# Patient Record
Sex: Female | Born: 1990
Health system: Southern US, Community
[De-identification: ages and names within clinical notes are randomized; demographics above are authoritative.]

## PROBLEM LIST (undated history)

## (undated) ENCOUNTER — Inpatient Hospital Stay (HOSPITAL_COMMUNITY): Payer: Self-pay

## (undated) DIAGNOSIS — F32A Depression, unspecified: Secondary | ICD-10-CM

## (undated) DIAGNOSIS — A599 Trichomoniasis, unspecified: Secondary | ICD-10-CM

## (undated) DIAGNOSIS — E669 Obesity, unspecified: Secondary | ICD-10-CM

## (undated) DIAGNOSIS — A549 Gonococcal infection, unspecified: Secondary | ICD-10-CM

## (undated) DIAGNOSIS — F419 Anxiety disorder, unspecified: Secondary | ICD-10-CM

## (undated) DIAGNOSIS — F329 Major depressive disorder, single episode, unspecified: Secondary | ICD-10-CM

## (undated) DIAGNOSIS — A749 Chlamydial infection, unspecified: Secondary | ICD-10-CM

## (undated) HISTORY — DX: Trichomoniasis, unspecified: A59.9

## (undated) HISTORY — DX: Gonococcal infection, unspecified: A54.9

## (undated) HISTORY — PX: TONSILLECTOMY: SUR1361

## (undated) HISTORY — DX: Chlamydial infection, unspecified: A74.9

## (undated) HISTORY — PX: FOOT SURGERY: SHX648

## (undated) HISTORY — PX: ADENOIDECTOMY: SUR15

---
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
## 2010-07-17 ENCOUNTER — Emergency Department (HOSPITAL_COMMUNITY): Admission: EM | Admit: 2010-07-17 | Discharge: 2010-07-17 | Payer: Self-pay | Admitting: Emergency Medicine

## 2010-08-17 ENCOUNTER — Emergency Department (HOSPITAL_COMMUNITY): Admission: EM | Admit: 2010-08-17 | Discharge: 2010-08-17 | Payer: Self-pay | Admitting: Emergency Medicine

## 2011-07-20 LAB — URINALYSIS, ROUTINE W REFLEX MICROSCOPIC
Glucose, UA: NEGATIVE
Nitrite: NEGATIVE
Specific Gravity, Urine: 1.02
pH: 6

## 2011-07-20 LAB — URINE MICROSCOPIC-ADD ON

## 2011-07-20 LAB — URINE CULTURE

## 2011-07-20 LAB — PREGNANCY, URINE: Preg Test, Ur: NEGATIVE

## 2011-07-24 LAB — POCT I-STAT, CHEM 8
Chloride: 102
HCT: 41
Potassium: 4
Sodium: 138

## 2011-07-24 LAB — POCT CARDIAC MARKERS
CKMB, poc: <1 — ABNORMAL LOW
Myoglobin, poc: 81.9
Troponin i, poc: <0.05

## 2011-07-24 LAB — D-DIMER, QUANTITATIVE: D-Dimer, Quant: 0.33

## 2011-07-26 LAB — DIFFERENTIAL
Lymphs Abs: 2
Monocytes Relative: 8
Neutro Abs: 4.1
Neutrophils Relative %: 60

## 2011-07-26 LAB — PREGNANCY, URINE: Preg Test, Ur: NEGATIVE

## 2011-07-26 LAB — COMPREHENSIVE METABOLIC PANEL
ALT: 10
BUN: 14
Calcium: 9.3
Glucose, Bld: 94
Sodium: 137
Total Protein: 6.9

## 2011-07-26 LAB — URINALYSIS, ROUTINE W REFLEX MICROSCOPIC
Bilirubin Urine: NEGATIVE
Ketones, ur: NEGATIVE
Nitrite: NEGATIVE
Protein, ur: NEGATIVE
pH: 6

## 2011-07-26 LAB — CBC
Hemoglobin: 13
MCHC: 32.5
RDW: 14.6

## 2011-09-02 ENCOUNTER — Encounter: Payer: Self-pay | Admitting: *Deleted

## 2011-09-02 ENCOUNTER — Emergency Department (HOSPITAL_COMMUNITY)
Admission: EM | Admit: 2011-09-02 | Discharge: 2011-09-02 | Disposition: A | Discharge status: Home / Self Care | Payer: Medicaid Other | Attending: Emergency Medicine | Admitting: Emergency Medicine

## 2011-09-02 DIAGNOSIS — X500XXA Overexertion from strenuous movement or load, initial encounter: Secondary | ICD-10-CM | POA: Insufficient documentation

## 2011-09-02 DIAGNOSIS — S39012A Strain of muscle, fascia and tendon of lower back, initial encounter: Secondary | ICD-10-CM

## 2011-09-02 DIAGNOSIS — M5416 Radiculopathy, lumbar region: Secondary | ICD-10-CM

## 2011-09-02 DIAGNOSIS — M545 Low back pain, unspecified: Secondary | ICD-10-CM | POA: Insufficient documentation

## 2011-09-02 DIAGNOSIS — S335XXA Sprain of ligaments of lumbar spine, initial encounter: Secondary | ICD-10-CM | POA: Insufficient documentation

## 2011-09-02 DIAGNOSIS — F17200 Nicotine dependence, unspecified, uncomplicated: Secondary | ICD-10-CM | POA: Insufficient documentation

## 2011-09-02 DIAGNOSIS — IMO0002 Reserved for concepts with insufficient information to code with codable children: Secondary | ICD-10-CM | POA: Insufficient documentation

## 2011-09-02 DIAGNOSIS — IMO0001 Reserved for inherently not codable concepts without codable children: Secondary | ICD-10-CM | POA: Insufficient documentation

## 2011-09-02 DIAGNOSIS — M25569 Pain in unspecified knee: Secondary | ICD-10-CM | POA: Insufficient documentation

## 2011-09-02 MED ORDER — CYCLOBENZAPRINE HCL 10 MG PO TABS: ORAL_TABLET | ORAL | Status: DC

## 2011-09-02 MED ORDER — IBUPROFEN 800 MG PO TABS
800.0000 mg | ORAL_TABLET | Freq: Once | ORAL | Status: AC
  Administered 2011-09-02: 800 mg via ORAL
  Filled 2011-09-02: qty 1

## 2011-09-02 MED ORDER — CYCLOBENZAPRINE HCL 10 MG PO TABS
10.0000 mg | ORAL_TABLET | Freq: Once | ORAL | Status: AC
  Administered 2011-09-02: 10 mg via ORAL
  Filled 2011-09-02: qty 1

## 2011-09-02 NOTE — ED Provider Notes (Signed)
History     CSN: [id number] Arrival date & time: 09/02/2011  2:25 PM   First MD Initiated Contact with Patient 09/02/11 1428      Chief Complaint  Patient presents with  . Back Pain    (Consider location/radiation/quality/duration/timing/severity/associated sxs/prior treatment) HPI Comments: Pt began an intense house cleaning regimen 2 days ago.  Her lower back  Has hurt since.  "it's because i'm carrying around so much weight".  Denies any falls or other trauma.  Patient is a 20 y.o. female presenting with back pain. The history is provided by the patient. No language interpreter was used.  Back Pain  This is a new problem. The current episode started 2 days ago. The problem occurs constantly. The problem has been gradually worsening. The pain is present in the lumbar spine. The quality of the pain is described as shooting. The pain radiates to the right knee. The pain is at a severity of 8/10. The symptoms are aggravated by bending and twisting. The pain is worse during the night. She has tried heat for the symptoms. The treatment provided no relief. Risk factors include obesity.    Past Medical History  Diagnosis Date  . Hypertension     Past Surgical History  Procedure Date  . Foot surgery     left  . Tonsillectomy   . Adenoidectomy     History reviewed. No pertinent family history.  History  Substance Use Topics  . Smoking status: Current Everyday Smoker    Types: Cigarettes  . Smokeless tobacco: Not on file  . Alcohol Use: No    OB History    Grav Para Term Preterm Abortions TAB SAB Ect Mult Living                  Review of Systems  Musculoskeletal: Positive for back pain.  All other systems reviewed and are negative.    Allergies  Review of patient's allergies indicates no known allergies.  Home Medications  No current outpatient prescriptions on file.  BP 136/75  Pulse 71  Temp(Src) 98.4 F (36.9 C) (Oral)  Resp 20  Ht 5' 7.5" (1.715 m)   Wt 407 lb (184.614 kg)  BMI 62.80 kg/m2  SpO2 100%  LMP 05/24/2011  Physical Exam  Nursing note and vitals reviewed. Constitutional: She is oriented to person, place, and time. She appears well-developed and well-nourished.  HENT:  Head: Normocephalic and atraumatic.  Eyes: EOM are normal.  Neck: Normal range of motion.  Cardiovascular: Normal rate, regular rhythm and normal heart sounds.   Pulmonary/Chest: Effort normal and breath sounds normal.  Abdominal: Soft. She exhibits no distension. There is no tenderness.  Musculoskeletal: She exhibits tenderness.       Lumbar back: She exhibits decreased range of motion, tenderness and pain. She exhibits no bony tenderness, no deformity, no laceration, no spasm and normal pulse.       Back:  Neurological: She is alert and oriented to person, place, and time.  Skin: Skin is warm and dry.  Psychiatric: She has a normal mood and affect. Judgment normal.    ED Course  Procedures (including critical care time)  Labs Reviewed - No data to display No results found.   No diagnosis found.    MDM          Worthy Rancher, PA 09/02/11 1537

## 2011-09-02 NOTE — ED Notes (Signed)
Pt a/ox4. resp even and unlabored.NAD at this time. D/C instructions and Rx x1 reviewed with pt. Pt verbalized understanding. Pt ambulated with steady gate to POV. Mother with pt to transport home.

## 2011-09-02 NOTE — ED Provider Notes (Signed)
Medical screening examination/treatment/procedure(s) were performed by non-physician practitioner and as supervising physician I was immediately available for consultation/collaboration.  Geoffery Lyons, MD 09/02/11 [phone]

## 2011-09-02 NOTE — ED Notes (Signed)
Pt c/o pain in her lower back since yesterday. Denies injury.

## 2011-12-30 ENCOUNTER — Encounter (HOSPITAL_COMMUNITY): Payer: Self-pay

## 2011-12-30 ENCOUNTER — Emergency Department (HOSPITAL_COMMUNITY): Payer: Medicaid Other

## 2011-12-30 ENCOUNTER — Emergency Department (HOSPITAL_COMMUNITY)
Admission: EM | Admit: 2011-12-30 | Discharge: 2011-12-30 | Disposition: A | Discharge status: Home / Self Care | Payer: Medicaid Other | Attending: Emergency Medicine | Admitting: Emergency Medicine

## 2011-12-30 DIAGNOSIS — Z79899 Other long term (current) drug therapy: Secondary | ICD-10-CM | POA: Insufficient documentation

## 2011-12-30 DIAGNOSIS — M7989 Other specified soft tissue disorders: Secondary | ICD-10-CM | POA: Insufficient documentation

## 2011-12-30 DIAGNOSIS — M79609 Pain in unspecified limb: Secondary | ICD-10-CM | POA: Insufficient documentation

## 2011-12-30 DIAGNOSIS — I10 Essential (primary) hypertension: Secondary | ICD-10-CM | POA: Insufficient documentation

## 2011-12-30 DIAGNOSIS — M79671 Pain in right foot: Secondary | ICD-10-CM

## 2011-12-30 DIAGNOSIS — F17200 Nicotine dependence, unspecified, uncomplicated: Secondary | ICD-10-CM | POA: Insufficient documentation

## 2011-12-30 MED ORDER — IBUPROFEN 800 MG PO TABS
800.0000 mg | ORAL_TABLET | Freq: Once | ORAL | Status: AC
  Administered 2011-12-30: 800 mg via ORAL
  Filled 2011-12-30: qty 1

## 2011-12-30 MED ORDER — HYDROCODONE-ACETAMINOPHEN 5-325 MG PO TABS
2.0000 | ORAL_TABLET | Freq: Once | ORAL | Status: AC
  Administered 2011-12-30: 2 via ORAL
  Filled 2011-12-30: qty 2

## 2011-12-30 MED ORDER — ONDANSETRON HCL 4 MG PO TABS
4.0000 mg | ORAL_TABLET | Freq: Once | ORAL | Status: AC
  Administered 2011-12-30: 4 mg via ORAL
  Filled 2011-12-30: qty 1

## 2011-12-30 MED ORDER — CEPHALEXIN 500 MG PO CAPS: 500.0000 mg | ORAL_CAPSULE | Freq: Four times a day (QID) | ORAL | Status: DC

## 2011-12-30 MED ORDER — HYDROCODONE-ACETAMINOPHEN 7.5-325 MG PO TABS: 1.0000 | ORAL_TABLET | ORAL | Status: DC | PRN

## 2011-12-30 MED ORDER — IBUPROFEN 800 MG PO TABS: 800.0000 mg | ORAL_TABLET | Freq: Three times a day (TID) | ORAL | Status: DC

## 2011-12-30 MED ORDER — CEFIXIME 400 MG PO TABS
400.0000 mg | ORAL_TABLET | Freq: Once | ORAL | Status: AC
  Administered 2011-12-30: 400 mg via ORAL
  Filled 2011-12-30: qty 1

## 2011-12-30 NOTE — ED Provider Notes (Signed)
History     CSN: [id number]  Arrival date & time 12/30/11  1517   First MD Initiated Contact with Patient 12/30/11 1555      Chief Complaint  Patient presents with  . Foot Pain    (Consider location/radiation/quality/duration/timing/severity/associated sxs/prior treatment) HPI Comments: Patient states she sustained an injury to her foot in 2006 or 2007. Patient states she stepped on a piece of glass approximately 4 weeks ago injuring the right foot. She feels she got the glass out, however she continues to have some pain and swelling over the lateral portion of the right foot and is fearful that a piece of the glass may still be in the foot. She has not had drainage or ulcers of the foot. She does not recall any other injury to the foot. There been no procedures involving the right foot.  Patient is a 21 y.o. female presenting with lower extremity pain. The history is provided by the patient.  Foot Pain Pertinent negatives include no abdominal pain, arthralgias, chest pain, coughing or neck pain.    Past Medical History  Diagnosis Date  . Hypertension     Past Surgical History  Procedure Date  . Foot surgery     left  . Tonsillectomy   . Adenoidectomy     No family history on file.  History  Substance Use Topics  . Smoking status: Current Everyday Smoker    Types: Cigarettes  . Smokeless tobacco: Not on file  . Alcohol Use: No    OB History    Grav Para Term Preterm Abortions TAB SAB Ect Mult Living                  Review of Systems  Constitutional: Negative for activity change.       All ROS Neg except as noted in HPI  HENT: Negative for nosebleeds and neck pain.   Eyes: Negative for photophobia and discharge.  Respiratory: Negative for cough, shortness of breath and wheezing.   Cardiovascular: Negative for chest pain and palpitations.  Gastrointestinal: Negative for abdominal pain and blood in stool.  Genitourinary: Negative for dysuria, frequency and  hematuria.  Musculoskeletal: Negative for back pain and arthralgias.       Foot pain  Skin: Negative.   Neurological: Negative for dizziness, seizures and speech difficulty.  Psychiatric/Behavioral: Negative for hallucinations and confusion.    Allergies  Review of patient's allergies indicates no known allergies.  Home Medications   Current Outpatient Rx  Name Route Sig Dispense Refill  . AMLODIPINE BESYLATE 10 MG PO TABS Oral Take 10 mg by mouth every morning.    . ENALAPRIL MALEATE 10 MG PO TABS Oral Take 10 mg by mouth 2 (two) times daily.    Marland Kitchen LOSARTAN POTASSIUM 50 MG PO TABS Oral Take 75 mg by mouth every morning.    . NORETHINDRONE ACETATE 5 MG PO TABS Oral Take 5 mg by mouth every morning.    . CEPHALEXIN 500 MG PO CAPS Oral Take 1 capsule (500 mg total) by mouth 4 (four) times daily. 20 capsule 0  . HYDROCODONE-ACETAMINOPHEN 7.5-325 MG PO TABS Oral Take 1 tablet by mouth every 4 (four) hours as needed for pain. 20 tablet 0  . IBUPROFEN 800 MG PO TABS Oral Take 1 tablet (800 mg total) by mouth 3 (three) times daily. 21 tablet 0    BP 150/81  Pulse 103  Temp(Src) 97.7 F (36.5 C) (Oral)  Resp 20  Ht 5\' 8"  (1.727  m)  Wt 416 lb 9 oz (188.952 kg)  BMI 63.34 kg/m2  SpO2 99%  LMP 10/29/2011  Physical Exam  Nursing note and vitals reviewed. Constitutional: She is oriented to person, place, and time. She appears well-developed and well-nourished.  Non-toxic appearance.  HENT:  Head: Normocephalic.  Right Ear: Tympanic membrane and external ear normal.  Left Ear: Tympanic membrane and external ear normal.  Eyes: EOM and lids are normal. Pupils are equal, round, and reactive to light.  Neck: Normal range of motion. Neck supple. Carotid bruit is not present.  Cardiovascular: Normal rate, regular rhythm, normal heart sounds, intact distal pulses and normal pulses.   Pulmonary/Chest: Breath sounds normal. No respiratory distress.  Abdominal: Soft. Bowel sounds are normal.  There is no tenderness. There is no guarding.  Musculoskeletal: Normal range of motion.       There is full range of motion of the right ankle. There is full range of motion of the toes of the right foot. There is good capillary refill. There is a raised tender area of the lateral right foot, adjacent to the base of the fifth tarsal bone. No puncture site appreciated at this time. No drainage present. No rich streaks.  Lymphadenopathy:       Head (right side): No submandibular adenopathy present.       Head (left side): No submandibular adenopathy present.    She has no cervical adenopathy.  Neurological: She is alert and oriented to person, place, and time. She has normal strength. No cranial nerve deficit or sensory deficit.  Skin: Skin is warm and dry.  Psychiatric: She has a normal mood and affect. Her speech is normal.    ED Course  Procedures (including critical care time)  Labs Reviewed - No data to display Dg Foot Complete Right  12/30/2011  *RADIOLOGY REPORT*  Clinical Data: Foot pain.  Stepped on a piece of glass 1 month ago.  RIGHT FOOT COMPLETE - 3+ VIEW  Comparison: Right foot radiographs dated 03/17/2005.  Findings: No radiopaque foreign body is noted.  No definite acute fracture, subluxation, dislocation, joint or soft tissue abnormality.  There is a slight irregularity of the medial head of the first metatarsal, which appears similar to prior study from 03/17/2005, and may relate to remote trauma.  IMPRESSION: 1.  No acute radiographic abnormality of the right foot. Specifically, no retained radiopaque foreign body identified.  Original Report Authenticated By: Florencia Reasons, M.D.     1. Foot pain, right       MDM  I have reviewed nursing notes, vital signs, and all appropriate lab and imaging results for this patient. The patient sustained an injury to the right foot approximately 6 years ago. 4 weeks ago she stepped on a piece of glass, she feels that she remove the  glass but in the last few days she's had pain and swelling over the lateral portion of the right foot. X-ray is negative for foreign body, fluid, or gas. The patient is treated with ibuprofen 800 mg 3 times daily, Norco 7.5 mg for pain, and Keflex 500 mg for possible hidden infection. Patient is also referred to podiatry for additional evaluation.       Kathie Dike, [place] 12/30/11 1728

## 2011-12-30 NOTE — ED Notes (Signed)
Pt "dug a piece of glass" out of her right foot 4 weeks ago, cont. To have pain and swelling. To foot

## 2011-12-30 NOTE — Discharge Instructions (Signed)
Your x-ray is negative for foreign body in the foot. Please use ibuprofen 3 times daily with food. Please use Keflex 4 times daily with food. Please use Norco for pain if needed. This medication may cause drowsiness, please use with caution. If pain persists please call the foot specialists listed above for appointment.

## 2011-12-30 NOTE — ED Notes (Signed)
Pt states "had a piece of glass in side of foot bout 4 weeks ago has not gotten better."  Pt reports getting a piece of glass out of it, however believes it still has glass in wound. Small darkened area noted on outer edge of rt food. Minimal swelling noted at site. Pt reports soaking foot and "picking at wound" but not finding any more glass. Pt states the glass was clear that was found in it originally.

## 2011-12-31 NOTE — ED Provider Notes (Signed)
Medical screening examination/treatment/procedure(s) were performed by non-physician practitioner and as supervising physician I was immediately available for consultation/collaboration.   Eveleigh Crumpler, MD 12/31/11 1220 

## 2012-01-05 ENCOUNTER — Emergency Department (HOSPITAL_COMMUNITY): Payer: Medicaid Other

## 2012-01-05 ENCOUNTER — Encounter (HOSPITAL_COMMUNITY): Payer: Self-pay

## 2012-01-05 ENCOUNTER — Inpatient Hospital Stay (HOSPITAL_COMMUNITY)
Admission: EM | Admit: 2012-01-05 | Discharge: 2012-01-08 | DRG: 574 | Disposition: A | Discharge status: Home / Self Care | Payer: Medicaid Other | Attending: Internal Medicine | Admitting: Internal Medicine

## 2012-01-05 DIAGNOSIS — L03119 Cellulitis of unspecified part of limb: Secondary | ICD-10-CM | POA: Diagnosis present

## 2012-01-05 DIAGNOSIS — L02619 Cutaneous abscess of unspecified foot: Principal | ICD-10-CM | POA: Diagnosis present

## 2012-01-05 DIAGNOSIS — I10 Essential (primary) hypertension: Secondary | ICD-10-CM | POA: Diagnosis present

## 2012-01-05 DIAGNOSIS — L03115 Cellulitis of right lower limb: Secondary | ICD-10-CM

## 2012-01-05 DIAGNOSIS — E871 Hypo-osmolality and hyponatremia: Secondary | ICD-10-CM | POA: Diagnosis present

## 2012-01-05 DIAGNOSIS — O10919 Unspecified pre-existing hypertension complicating pregnancy, unspecified trimester: Secondary | ICD-10-CM | POA: Diagnosis present

## 2012-01-05 DIAGNOSIS — Z79899 Other long term (current) drug therapy: Secondary | ICD-10-CM

## 2012-01-05 HISTORY — DX: Anxiety disorder, unspecified: F41.9

## 2012-01-05 HISTORY — DX: Major depressive disorder, single episode, unspecified: F32.9

## 2012-01-05 HISTORY — DX: Depression, unspecified: F32.A

## 2012-01-05 LAB — DIFFERENTIAL
Basophils Relative: 0 % (ref 0–1)
Eosinophils Absolute: 0.3 10*3/uL (ref 0.0–0.7)
Monocytes Absolute: 0.5 10*3/uL (ref 0.1–1.0)
Monocytes Relative: 8 % (ref 3–12)

## 2012-01-05 LAB — CBC
HCT: 42.1 % (ref 36.0–46.0)
Hemoglobin: 14.1 g/dL (ref 12.0–15.0)
MCH: 29.1 pg (ref 26.0–34.0)
MCHC: 33.5 g/dL (ref 30.0–36.0)

## 2012-01-05 LAB — BASIC METABOLIC PANEL
BUN: 8 mg/dL (ref 6–23)
CO2: 30 mEq/L (ref 19–32)
Chloride: 99 mEq/L (ref 96–112)
Glucose, Bld: 83 mg/dL (ref 70–99)
Potassium: 3.7 mEq/L (ref 3.5–5.1)

## 2012-01-05 LAB — PREGNANCY, URINE: Preg Test, Ur: NEGATIVE

## 2012-01-05 MED ORDER — VANCOMYCIN HCL IN DEXTROSE 1-5 GM/200ML-% IV SOLN
1000.0000 mg | Freq: Once | INTRAVENOUS | Status: AC
  Administered 2012-01-05: 1000 mg via INTRAVENOUS
  Filled 2012-01-05: qty 200

## 2012-01-05 MED ORDER — VANCOMYCIN HCL 1000 MG IV SOLR
2000.0000 mg | Freq: Two times a day (BID) | INTRAVENOUS | Status: DC
  Administered 2012-01-06 – 2012-01-08 (×4): 2000 mg via INTRAVENOUS
  Filled 2012-01-05 (×7): qty 2000

## 2012-01-05 MED ORDER — ENALAPRIL MALEATE 5 MG PO TABS
10.0000 mg | ORAL_TABLET | Freq: Two times a day (BID) | ORAL | Status: DC
  Administered 2012-01-05 – 2012-01-08 (×6): 10 mg via ORAL
  Filled 2012-01-05: qty 2
  Filled 2012-01-05: qty 1
  Filled 2012-01-05 (×4): qty 2

## 2012-01-05 MED ORDER — PNEUMOCOCCAL VAC POLYVALENT 25 MCG/0.5ML IJ INJ
0.5000 mL | INJECTION | INTRAMUSCULAR | Status: AC
Start: 2012-01-06 — End: 2012-01-06
  Administered 2012-01-06: 0.5 mL via INTRAMUSCULAR
  Filled 2012-01-05: qty 0.5

## 2012-01-05 MED ORDER — IBUPROFEN 400 MG PO TABS: 400.0000 mg | ORAL_TABLET | Freq: Four times a day (QID) | ORAL | Status: DC | PRN

## 2012-01-05 MED ORDER — LOSARTAN POTASSIUM 50 MG PO TABS
75.0000 mg | ORAL_TABLET | Freq: Every day | ORAL | Status: DC
  Administered 2012-01-06 – 2012-01-08 (×3): 75 mg via ORAL
  Filled 2012-01-05 (×2): qty 2
  Filled 2012-01-05: qty 1

## 2012-01-05 MED ORDER — DOCUSATE SODIUM 100 MG PO CAPS
100.0000 mg | ORAL_CAPSULE | Freq: Two times a day (BID) | ORAL | Status: DC
  Administered 2012-01-05 – 2012-01-08 (×6): 100 mg via ORAL
  Filled 2012-01-05 (×6): qty 1

## 2012-01-05 MED ORDER — VANCOMYCIN HCL IN DEXTROSE 1-5 GM/200ML-% IV SOLN
INTRAVENOUS | Status: AC
  Filled 2012-01-05: qty 200

## 2012-01-05 MED ORDER — AMLODIPINE BESYLATE 5 MG PO TABS
10.0000 mg | ORAL_TABLET | Freq: Every day | ORAL | Status: DC
  Administered 2012-01-06 – 2012-01-08 (×3): 10 mg via ORAL
  Filled 2012-01-05 (×3): qty 2

## 2012-01-05 MED ORDER — NORETHINDRONE ACETATE 5 MG PO TABS
5.0000 mg | ORAL_TABLET | Freq: Every day | ORAL | Status: DC
  Administered 2012-01-06 – 2012-01-07 (×2): 5 mg via ORAL
  Filled 2012-01-05 (×5): qty 1

## 2012-01-05 MED ORDER — ZOLPIDEM TARTRATE 5 MG PO TABS: 5.0000 mg | ORAL_TABLET | Freq: Every evening | ORAL | Status: DC | PRN

## 2012-01-05 MED ORDER — SODIUM CHLORIDE 0.9 % IJ SOLN
INTRAMUSCULAR | Status: AC
  Filled 2012-01-05: qty 3

## 2012-01-05 MED ORDER — HYDROCODONE-ACETAMINOPHEN 5-325 MG PO TABS
1.0000 | ORAL_TABLET | ORAL | Status: DC | PRN
  Administered 2012-01-06 – 2012-01-07 (×3): 1 via ORAL
  Administered 2012-01-08 (×2): 2 via ORAL
  Filled 2012-01-05: qty 2
  Filled 2012-01-05 (×3): qty 1
  Filled 2012-01-05: qty 2

## 2012-01-05 MED ORDER — INFLUENZA VIRUS VACC SPLIT PF IM SUSP
0.5000 mL | INTRAMUSCULAR | Status: AC
  Administered 2012-01-06: 0.5 mL via INTRAMUSCULAR
  Filled 2012-01-05: qty 0.5

## 2012-01-05 NOTE — ED Provider Notes (Signed)
History     CSN: [id number]  Arrival date & time 01/05/12  1407   First MD Initiated Contact with Patient 01/05/12 [phone]      Chief Complaint  Patient presents with  . Foot Pain    (Consider location/radiation/quality/duration/timing/severity/associated sxs/prior treatment) HPI Comments: Patient presents for recheck of her right foot infection for which she was seen here 6 days ago and placed on Keflex.  She believes her infection and swelling of her foot has become worse and has developed a large blister on the side of her foot.  She reports stepping in broken glass about one month ago and felt she may have had a retained piece of glass in the foot but was then able to retrieve this and eventually the wound healed without any problems until the past week.  She denies fevers or chills, also denies nausea or vomiting, but does have increased pain in the foot and is having difficulty with ambulation secondary to pain.  Mother mentions that patient is borderline diabetic.  Patient is a 21 y.o. female presenting with lower extremity pain.  Foot Pain This is a new problem. The current episode started in the past 7 days. The problem occurs constantly. The problem has been gradually worsening. Pertinent negatives include no abdominal pain, arthralgias, chest pain, congestion, fever, headaches, joint swelling, nausea, neck pain, numbness, rash, sore throat, vomiting or weakness.  Foot Pain This is a new problem. The current episode started in the past 7 days. The problem occurs constantly. The problem has been gradually worsening. Pertinent negatives include no chest pain, no abdominal pain, no headaches and no shortness of breath.    Past Medical History  Diagnosis Date  . Hypertension     Past Surgical History  Procedure Date  . Foot surgery     left  . Tonsillectomy   . Adenoidectomy     No family history on file.  History  Substance Use Topics  . Smoking status: Current Everyday  Smoker    Types: Cigarettes  . Smokeless tobacco: Not on file  . Alcohol Use: No    OB History    Grav Para Term Preterm Abortions TAB SAB Ect Mult Living                  Review of Systems  Constitutional: Negative for fever.  HENT: Negative for congestion, sore throat and neck pain.   Eyes: Negative.   Respiratory: Negative for chest tightness and shortness of breath.   Cardiovascular: Negative for chest pain.  Gastrointestinal: Negative for nausea, vomiting and abdominal pain.  Genitourinary: Negative.   Musculoskeletal: Negative for joint swelling and arthralgias.  Skin: Positive for color change and wound. Negative for rash.  Neurological: Negative for dizziness, weakness, light-headedness, numbness and headaches.  Hematological: Negative.   Psychiatric/Behavioral: Negative.     Allergies  Review of patient's allergies indicates no known allergies.  Home Medications   Current Outpatient Rx  Name Route Sig Dispense Refill  . AMLODIPINE BESYLATE 10 MG PO TABS Oral Take 10 mg by mouth every morning.    . CEPHALEXIN 500 MG PO CAPS Oral Take 1 capsule (500 mg total) by mouth 4 (four) times daily. 20 capsule 0  . ENALAPRIL MALEATE 10 MG PO TABS Oral Take 10 mg by mouth 2 (two) times daily.    . IBUPROFEN 800 MG PO TABS Oral Take 1 tablet (800 mg total) by mouth 3 (three) times daily. 21 tablet 0  . LOSARTAN POTASSIUM  50 MG PO TABS Oral Take 75 mg by mouth every morning.    Marland Kitchen METFORMIN HCL 500 MG PO TABS Oral Take 500 mg by mouth 2 (two) times daily with a meal.    . NORETHINDRONE ACETATE 5 MG PO TABS Oral Take 5 mg by mouth every morning.      BP 138/67  Pulse 78  Temp(Src) 97.1 F (36.2 C) (Oral)  Resp 20  Ht 5\' 8"  (1.727 m)  Wt 416 lb 9 oz (188.952 kg)  BMI 63.34 kg/m2  SpO2 100%  LMP 10/29/2011  Physical Exam  Nursing note and vitals reviewed. Constitutional: She is oriented to person, place, and time. She appears well-developed and well-nourished.        Morbidly obese.  HENT:  Head: Normocephalic and atraumatic.  Eyes: Conjunctivae are normal.  Neck: Normal range of motion.  Cardiovascular: Normal rate, regular rhythm, normal heart sounds and intact distal pulses.   Pulmonary/Chest: Effort normal and breath sounds normal. She has no wheezes.  Abdominal: Soft. Bowel sounds are normal. There is no tenderness.  Musculoskeletal: Normal range of motion.  Neurological: She is alert and oriented to person, place, and time.  Skin: Skin is warm. There is erythema.       Tender,  2 cm raised bulla on right lateral midfoot with surrounding erythema and edema across foot dorsum to ankle.  Psychiatric: She has a normal mood and affect.    ED Course  Procedures (including critical care time)  Labs Reviewed  BASIC METABOLIC PANEL - Abnormal; Notable for the following:    Sodium 134 (*)    All other components within normal limits  DIFFERENTIAL - Abnormal; Notable for the following:    Neutrophils Relative 35 (*)    Lymphocytes Relative 51 (*)    All other components within normal limits  CBC  PREGNANCY, URINE  URINALYSIS, ROUTINE W REFLEX MICROSCOPIC   Dg Foot Complete Right  01/05/2012  *RADIOLOGY REPORT*  Clinical Data: Swelling.  History of glass in the foot.  RIGHT FOOT COMPLETE - 3+ VIEW  Comparison: 12/30/2011.  03/17/2005.  Findings: There is focal lateral soft tissue swelling adjacent to the base of the fifth metatarsal.  This appears to be quite superficial.  There is also some generalized soft tissue swelling of the forefoot.  On the lateral view, there are small linear radio opacities projected in or over the swollen dorsal soft tissues.  I can count three of these .  The nature is uncertain.  They are not present previously.  I cannot see them on either of the other two views.  In the region of the lateral swelling, there are two tiny densities marked with arrows that I think are artifactual based on change in position.  The patient has  a degree of flat foot with some navicular osteophyte formation  IMPRESSION: Dorsal soft tissue swelling.  Three small linear opacities seen projected over the swollen soft tissues on the lateral view.  The nature of these is uncertain.  They could be foreign objects.  They are not seen on the previous lateral view.  I cannot see them on the other views taken today.  Therefore, it is possible that they are artifact on the patient.  Lateral soft tissue swelling which appears superficial.  No definite foreign object.  Tiny densities in the region are not consistently positioned and are probably artifact.  Original Report Authenticated By: Thomasenia Sales, M.D.     1. Cellulitis of right  foot       MDM  Patient with failed outpatient therapy for right foot cellulitis with possible retained foreign body.  Needs admission for IV antibiotics.  She was given her first dose of vancomycin while in the ED.  Spoke with Dr. Lendell Caprice with [facility] who will admit this patient.    Medical screening examination/treatment/procedure(s) were conducted as a shared visit with non-physician practitioner(s) and myself.  I personally evaluated the patient during the encounter 21 year old woman with cellulitis of the right foot that has not responded to oral antibiotics as an outpatient. Exam shows a blistered area over the right lateral foot adjacent to the right 5th MTP joint.  X-rays showed tiny foreign bodies in this same area.  Advised Rx with IV antibiotics, admission, probably will ultimately need surgical removal of the foreign bodies. Osvaldo Human, M.D.     Candis Musa, PA 01/05/12 1749  Carleene Cooper III, MD 01/06/12 [phone]

## 2012-01-05 NOTE — ED Notes (Signed)
Right foot pain for at least 2 weeks.  Large abcess to foot noted

## 2012-01-05 NOTE — Progress Notes (Signed)
ANTIBIOTIC CONSULT NOTE - INITIAL  Pharmacy Consult for Vancomycin Indication: cellulitis  No Known Allergies  Patient Measurements: Height: 5\' 8"  (172.7 cm) Weight: 415 lb 2 oz (188.3 kg) IBW/kg (Calculated) : 63.9   Vital Signs: Temp: 98.2 F (36.8 C) (03/15 1843) Temp src: Oral (03/15 1843) BP: 116/61 mmHg (03/15 1843) Pulse Rate: 60  (03/15 1843) Intake/Output from previous day:   Intake/Output from this shift:    Labs:  Basename 01/05/12 1625  WBC 6.2  HGB 14.1  PLT 261  LABCREA --  CREATININE 0.75   Estimated Creatinine Clearance: 201.3 ml/min (by C-G formula based on Cr of 0.75). No results found for this basename: VANCOTROUGH:2,VANCOPEAK:2,VANCORANDOM:2,GENTTROUGH:2,GENTPEAK:2,GENTRANDOM:2,TOBRATROUGH:2,TOBRAPEAK:2,TOBRARND:2,AMIKACINPEAK:2,AMIKACINTROU:2,AMIKACIN:2, in the last 72 hours   Microbiology: No results found for this or any previous visit (from the past 720 hour(s)).  Medical History: Past Medical History  Diagnosis Date  . Hypertension   . Anxiety     per patient/family  . Depression     per patient/family    Medications:  Scheduled:    . amLODipine  10 mg Oral Daily  . docusate sodium  100 mg Oral BID  . enalapril  10 mg Oral BID  . influenza  inactive virus vaccine  0.5 mL Intramuscular Tomorrow-1000  . losartan  75 mg Oral Daily  . norethindrone  5 mg Oral Daily  . pneumococcal 23 valent vaccine  0.5 mL Intramuscular Tomorrow-1000  . sodium chloride      . vancomycin  1,000 mg Intravenous Once  . vancomycin  1,000 mg Intravenous Once   Assessment: Okay for Protocol Received 1gm IV x 1 @ 16:30 in ED  Goal of Therapy:  Vancomycin trough level 10-15 mcg/ml  Plan:  Measure antibiotic drug levels at steady state Follow up culture results Give addition 1gm IV x 1 tonight. Begin 2000mg  IV every 12 hours in AM.  Mady Gemma 01/05/2012,7:34 PM

## 2012-01-05 NOTE — H&P (Signed)
Hospital Admission Note Date: 01/05/2012  Patient name: Hannah Ryan Medical record number: [id number] Date of birth: 1991-07-18 Age: 21 y.o. Gender: female PCP: No primary provider on file.  Attending physician: Christiane Ha, MD  Chief Complaint: Foot pain and swelling  History of Present Illness:  Hannah Ryan is an 21 y.o. female who presents with worsening right foot pain. About a month ago, she stepped on some glass and injured her right foot. Began swelling and she came to the emergency room on 12/30/2011. X-rays of the foot showed no foreign body. She was started on Keflex and pain medication. She has come back because the foot has become more swollen and painful. She's had no fevers or chills. There has been no drainage.  Past Medical History  Diagnosis Date  . Hypertension    denies history of diabetes, though apparently was on metformin at some point in the past.  Meds: Medications Prior to Admission  Medication Dose Route Frequency Provider Last Rate Last Dose   Medications Prior to Admission  Medication Sig Dispense Refill  . amLODipine (NORVASC) 10 MG tablet Take 10 mg by mouth every morning.      . cephALEXin (KEFLEX) 500 MG capsule Take 1 capsule (500 mg total) by mouth 4 (four) times daily.  20 capsule  0  . enalapril (VASOTEC) 10 MG tablet Take 10 mg by mouth 2 (two) times daily.      Marland Kitchen ibuprofen (ADVIL,MOTRIN) 800 MG tablet Take 1 tablet (800 mg total) by mouth 3 (three) times daily.  21 tablet  0  . losartan (COZAAR) 50 MG tablet Take 75 mg by mouth every morning.      . norethindrone (AYGESTIN) 5 MG tablet Take 5 mg by mouth every morning.       Allergies: Review of patient's allergies indicates no known allergies. History   Social History  . Marital Status: Single    Spouse Name: N/A    Number of Children: N/A  . Years of Education: N/A   Occupational History  . Not on file.   Social History Main Topics  . Smoking status: Current  Everyday Smoker    Types: Cigarettes  . Smokeless tobacco: Not on file  . Alcohol Use: No  . Drug Use: No  . Sexually Active: Yes    Birth Control/ Protection: Pill   Other Topics Concern  . Not on file   Social History Narrative  . No narrative on file   Family history significant for hypertension  Past Surgical History  Procedure Date  . Foot surgery     left  . Tonsillectomy   . Adenoidectomy     Review of Systems: Systems reviewed and as per HPI, otherwise negative.  Physical Exam: Blood pressure 123/74, pulse 78, temperature 97.8 F (36.6 C), temperature source Oral, resp. rate 20, height 5\' 8"  (1.727 m), weight 188.952 kg (416 lb 9 oz), last menstrual period 10/29/2011, SpO2 100.00%. BP 123/74  Pulse 78  Temp(Src) 97.8 F (36.6 C) (Oral)  Resp 20  Ht 5\' 8"  (1.727 m)  Wt 188.952 kg (416 lb 9 oz)  BMI 63.34 kg/m2  SpO2 100%  LMP 10/29/2011  General Appearance:    Alert, cooperative, no distress, appears stated age.morbidly obese   Head:    Normocephalic, without obvious abnormality, atraumatic  Eyes:    PERRL, conjunctiva/corneas clear, EOM's intact, fundi    benign, both eyes     Nose:   Nares normal, septum midline, mucosa  normal, no drainage    or sinus tenderness  Throat:   Lips, mucosa, and tongue normal; teeth and gums normal  Neck:   Supple, symmetrical, trachea midline, no adenopathy;    thyroid:  no enlargement/tenderness/nodules; no carotid   bruit or JVD  Back:     Symmetric, no curvature, ROM normal, no CVA tenderness  Lungs:     Clear to auscultation bilaterally, respirations unlabored  Chest Wall:    No tenderness or deformity   Heart:    Regular rate and rhythm, S1 and S2 normal, no murmur, rub   or gallop     Abdomen:     Soft, non-tender, bowel sounds active all four quadrants,    no masses, no organomegaly  Genitalia:   deferred   Rectal:   deferred   Extremities:   Extremities normal, atraumatic, no cyanosis or edema right foot is  swollen particularly on the dorsum. Erythematous warm extending past the ankle. There is a fluctuant area on the right which is very tender. No drainage.   Pulses:   2+ and symmetric all extremities  Skin:   Skin color, texture, turgor normal, no rashes or lesions  Lymph nodes:   Cervical, supraclavicular, and axillary nodes normal  Neurologic:   CNII-XII intact, normal strength, sensation and reflexes    throughout    Lab results: Basic Metabolic Panel:  Basename 01/05/12 1625  NA 134*  K 3.7  CL 99  CO2 30  GLUCOSE 83  BUN 8  CREATININE 0.75  CALCIUM 9.5  MG --  PHOS --   Liver Function Tests: No results found for this basename: AST:2,ALT:2,ALKPHOS:2,BILITOT:2,PROT:2,ALBUMIN:2 in the last 72 hours No results found for this basename: LIPASE:2,AMYLASE:2 in the last 72 hours No results found for this basename: AMMONIA:2 in the last 72 hours CBC:  Basename 01/05/12 1625  WBC 6.2  NEUTROABS 2.2  HGB 14.1  HCT 42.1  MCV 86.8  PLT 261    Imaging results:  Dg Foot Complete Right  01/05/2012  *RADIOLOGY REPORT*  Clinical Data: Swelling.  History of glass in the foot.  RIGHT FOOT COMPLETE - 3+ VIEW  Comparison: 12/30/2011.  03/17/2005.  Findings: There is focal lateral soft tissue swelling adjacent to the base of the fifth metatarsal.  This appears to be quite superficial.  There is also some generalized soft tissue swelling of the forefoot.  On the lateral view, there are small linear radio opacities projected in or over the swollen dorsal soft tissues.  I can count three of these .  The nature is uncertain.  They are not present previously.  I cannot see them on either of the other two views.  In the region of the lateral swelling, there are two tiny densities marked with arrows that I think are artifactual based on change in position.  The patient has a degree of flat foot with some navicular osteophyte formation  IMPRESSION: Dorsal soft tissue swelling.  Three small linear  opacities seen projected over the swollen soft tissues on the lateral view.  The nature of these is uncertain.  They could be foreign objects.  They are not seen on the previous lateral view.  I cannot see them on the other views taken today.  Therefore, it is possible that they are artifact on the patient.  Lateral soft tissue swelling which appears superficial.  No definite foreign object.  Tiny densities in the region are not consistently positioned and are probably artifact.  Original Report Authenticated By:  Thomasenia Sales, M.D.    Assessment & Plan: Principal Problem:  *Cellulitis and abscess of foot Active Problems:  Benign hypertension  Morbid obesity  Patient will be admitted. Elevate foot. Vancomycin. Surgery consult for I&D in the morning. Resume outpatient medications. Hold off on DVT prophylaxis until after seen by surgery.  Yuki Brunsman L 01/05/2012, 6:17 PM

## 2012-01-06 MED ORDER — HEPARIN SODIUM (PORCINE) 5000 UNIT/ML IJ SOLN
5000.0000 [IU] | Freq: Three times a day (TID) | INTRAMUSCULAR | Status: DC
  Administered 2012-01-06 – 2012-01-08 (×6): 5000 [IU] via SUBCUTANEOUS
  Filled 2012-01-06 (×3): qty 1
  Filled 2012-01-06: qty 2
  Filled 2012-01-06: qty 1

## 2012-01-06 MED ORDER — SODIUM CHLORIDE 0.9 % IJ SOLN
INTRAMUSCULAR | Status: AC
  Administered 2012-01-06: 10 mL
  Filled 2012-01-06: qty 3

## 2012-01-06 MED ORDER — ENOXAPARIN SODIUM 40 MG/0.4ML ~~LOC~~ SOLN
40.0000 mg | Freq: Once | SUBCUTANEOUS | Status: DC
  Filled 2012-01-06: qty 0.4

## 2012-01-06 NOTE — Progress Notes (Signed)
Subjective: Foot less swollen. Anxious to go home.  Objective: Vital signs in last 24 hours: Filed Vitals:   01/05/12 2231 01/06/12 0416 01/06/12 0919 01/06/12 1455  BP: 122/89 100/63 139/77 105/67  Pulse: 81 71 68 73  Temp: 98.2 F (36.8 C) 98.3 F (36.8 C) 98.4 F (36.9 C) 98 F (36.7 C)  TempSrc: Oral Oral Oral Oral  Resp: 20 20 18 18   Height:      Weight:      SpO2: 100% 96% 95% 97%   Weight change:   Intake/Output Summary (Last 24 hours) at 01/06/12 1633 Last data filed at 01/06/12 0800  Gross per 24 hour  Intake    480 ml  Output    500 ml  Net    -20 ml   Physical Exam: Foot less swollen. Less warmth and tenderness. Bullous area about the same.  Lab Results: Basic Metabolic Panel:  Lab 01/05/12 1610  NA 134*  K 3.7  CL 99  CO2 30  GLUCOSE 83  BUN 8  CREATININE 0.75  CALCIUM 9.5  MG --  PHOS --   Liver Function Tests: No results found for this basename: AST:2,ALT:2,ALKPHOS:2,BILITOT:2,PROT:2,ALBUMIN:2 in the last 168 hours No results found for this basename: LIPASE:2,AMYLASE:2 in the last 168 hours No results found for this basename: AMMONIA:2 in the last 168 hours CBC:  Lab 01/05/12 1625  WBC 6.2  NEUTROABS 2.2  HGB 14.1  HCT 42.1  MCV 86.8  PLT 261    Micro Results: No results found for this or any previous visit (from the past 240 hour(s)). Studies/Results: Dg Foot Complete Right  01/05/2012  *RADIOLOGY REPORT*  Clinical Data: Swelling.  History of glass in the foot.  RIGHT FOOT COMPLETE - 3+ VIEW  Comparison: 12/30/2011.  03/17/2005.  Findings: There is focal lateral soft tissue swelling adjacent to the base of the fifth metatarsal.  This appears to be quite superficial.  There is also some generalized soft tissue swelling of the forefoot.  On the lateral view, there are small linear radio opacities projected in or over the swollen dorsal soft tissues.  I can count three of these .  The nature is uncertain.  They are not present previously.   I cannot see them on either of the other two views.  In the region of the lateral swelling, there are two tiny densities marked with arrows that I think are artifactual based on change in position.  The patient has a degree of flat foot with some navicular osteophyte formation  IMPRESSION: Dorsal soft tissue swelling.  Three small linear opacities seen projected over the swollen soft tissues on the lateral view.  The nature of these is uncertain.  They could be foreign objects.  They are not seen on the previous lateral view.  I cannot see them on the other views taken today.  Therefore, it is possible that they are artifact on the patient.  Lateral soft tissue swelling which appears superficial.  No definite foreign object.  Tiny densities in the region are not consistently positioned and are probably artifact.  Original Report Authenticated By: Thomasenia Sales, M.D.   Scheduled Meds:   . amLODipine  10 mg Oral Daily  . docusate sodium  100 mg Oral BID  . enalapril  10 mg Oral BID  . enoxaparin  40 mg Subcutaneous Once  . heparin subcutaneous  5,000 Units Subcutaneous Q8H  . influenza  inactive virus vaccine  0.5 mL Intramuscular Tomorrow-1000  . losartan  75 mg Oral Daily  . norethindrone  5 mg Oral Daily  . pneumococcal 23 valent vaccine  0.5 mL Intramuscular Tomorrow-1000  . sodium chloride      . sodium chloride      . sodium chloride      . vancomycin  2,000 mg Intravenous Q12H  . vancomycin  1,000 mg Intravenous Once  . vancomycin  1,000 mg Intravenous Once   Continuous Infusions:  PRN Meds:.HYDROcodone-acetaminophen, ibuprofen, zolpidem Assessment/Plan: Principal Problem:  *Cellulitis and abscess of foot Active Problems:  Benign hypertension  Morbid obesity  Patient's cellulitis has improved on vancomycin. Will continue this. Appreciate surgery's assistance. DVT prophylaxis has been ordered.   LOS: 1 day   Hannah Ryan L 01/06/2012, 4:33 PM

## 2012-01-06 NOTE — Consult Note (Signed)
Reason for Consult:  Right foot abscess Referring Physician: Hospitalist  Hannah Ryan is an 21 y.o. female.  HPI: Patient presented to [facility] emergency department with increasing pain on the right foot. Apparently several weeks ago she stepped on a piece of glass. She was placed on antibiotics at that time for suspected infection. Over the time she's had increased swelling in the foot. She noticed a large bullous lesion develop approximately a week ago. This has not drained anything. She is exquisitely tender around the area. She denies any fevers or chills. No nausea or vomiting. She has been elevating her foot.  Past Medical History  Diagnosis Date  . Hypertension   . Anxiety     per patient/family  . Depression     per patient/family    Past Surgical History  Procedure Date  . Foot surgery     left  . Tonsillectomy   . Adenoidectomy     No family history on file.  Social History:  reports that she has been smoking Cigarettes.  She has smoked for the past 3 years. She does not have any smokeless tobacco history on file. She reports that she does not drink alcohol or use illicit drugs.  Allergies: No Known Allergies  Medications:  I have reviewed the patient's current medications. Prior to Admission:  Prescriptions prior to admission  Medication Sig Dispense Refill  . amLODipine (NORVASC) 10 MG tablet Take 10 mg by mouth every morning.      . cephALEXin (KEFLEX) 500 MG capsule Take 1 capsule (500 mg total) by mouth 4 (four) times daily.  20 capsule  0  . enalapril (VASOTEC) 10 MG tablet Take 10 mg by mouth 2 (two) times daily.      Marland Kitchen ibuprofen (ADVIL,MOTRIN) 800 MG tablet Take 1 tablet (800 mg total) by mouth 3 (three) times daily.  21 tablet  0  . losartan (COZAAR) 50 MG tablet Take 75 mg by mouth every morning.      . norethindrone (AYGESTIN) 5 MG tablet Take 5 mg by mouth every morning.       Scheduled:   . amLODipine  10 mg Oral Daily  . docusate sodium  100  mg Oral BID  . enalapril  10 mg Oral BID  . influenza  inactive virus vaccine  0.5 mL Intramuscular Tomorrow-1000  . losartan  75 mg Oral Daily  . norethindrone  5 mg Oral Daily  . pneumococcal 23 valent vaccine  0.5 mL Intramuscular Tomorrow-1000  . sodium chloride      . sodium chloride      . sodium chloride      . vancomycin  2,000 mg Intravenous Q12H  . vancomycin  1,000 mg Intravenous Once  . vancomycin  1,000 mg Intravenous Once   Continuous:  ZOX:[id number], ibuprofen, zolpidem  Results for orders placed during the hospital encounter of 01/05/12 (from the past 48 hour(s))  BASIC METABOLIC PANEL     Status: Abnormal   Collection Time   01/05/12  4:25 PM      Component Value Range Comment   Sodium 134 (*) 135 - 145 (mEq/L)    Potassium 3.7  3.5 - 5.1 (mEq/L)    Chloride 99  96 - 112 (mEq/L)    CO2 30  19 - 32 (mEq/L)    Glucose, Bld 83  70 - 99 (mg/dL)    BUN 8  6 - 23 (mg/dL)    Creatinine, Ser 0.86  0.50 - 1.10 (  mg/dL)    Calcium 9.5  8.4 - 10.5 (mg/dL)    GFR calc non Af Amer >90  >90 (mL/min)    GFR calc Af Amer >90  >90 (mL/min)   CBC     Status: Normal   Collection Time   01/05/12  4:25 PM      Component Value Range Comment   WBC 6.2  4.0 - 10.5 (K/uL)    RBC 4.85  3.87 - 5.11 (MIL/uL)    Hemoglobin 14.1  12.0 - 15.0 (g/dL)    HCT 16.1  09.6 - 04.5 (%)    MCV 86.8  78.0 - 100.0 (fL)    MCH 29.1  26.0 - 34.0 (pg)    MCHC 33.5  30.0 - 36.0 (g/dL)    RDW 40.9  81.1 - 91.4 (%)    Platelets 261  150 - 400 (K/uL)   DIFFERENTIAL     Status: Abnormal   Collection Time   01/05/12  4:25 PM      Component Value Range Comment   Neutrophils Relative 35 (*) 43 - 77 (%)    Neutro Abs 2.2  1.7 - 7.7 (K/uL)    Lymphocytes Relative 51 (*) 12 - 46 (%)    Lymphs Abs 3.2  0.7 - 4.0 (K/uL)    Monocytes Relative 8  3 - 12 (%)    Monocytes Absolute 0.5  0.1 - 1.0 (K/uL)    Eosinophils Relative 5  0 - 5 (%)    Eosinophils Absolute 0.3  0.0 - 0.7 (K/uL)     Basophils Relative 0  0 - 1 (%)    Basophils Absolute 0.0  0.0 - 0.1 (K/uL)   PREGNANCY, URINE     Status: Normal   Collection Time   01/05/12  7:32 PM      Component Value Range Comment   Preg Test, Ur NEGATIVE  NEGATIVE      Dg Foot Complete Right  01/05/2012  *RADIOLOGY REPORT*  Clinical Data: Swelling.  History of glass in the foot.  RIGHT FOOT COMPLETE - 3+ VIEW  Comparison: 12/30/2011.  03/17/2005.  Findings: There is focal lateral soft tissue swelling adjacent to the base of the fifth metatarsal.  This appears to be quite superficial.  There is also some generalized soft tissue swelling of the forefoot.  On the lateral view, there are small linear radio opacities projected in or over the swollen dorsal soft tissues.  I can count three of these .  The nature is uncertain.  They are not present previously.  I cannot see them on either of the other two views.  In the region of the lateral swelling, there are two tiny densities marked with arrows that I think are artifactual based on change in position.  The patient has a degree of flat foot with some navicular osteophyte formation  IMPRESSION: Dorsal soft tissue swelling.  Three small linear opacities seen projected over the swollen soft tissues on the lateral view.  The nature of these is uncertain.  They could be foreign objects.  They are not seen on the previous lateral view.  I cannot see them on the other views taken today.  Therefore, it is possible that they are artifact on the patient.  Lateral soft tissue swelling which appears superficial.  No definite foreign object.  Tiny densities in the region are not consistently positioned and are probably artifact.  Original Report Authenticated By: Thomasenia Sales, M.D.    Review of Systems  Constitutional: Negative.   HENT: Negative.   Eyes: Negative.   Respiratory: Negative.   Cardiovascular: Negative.   Gastrointestinal: Negative.   Genitourinary: Negative.   Musculoskeletal:       Right  foot pain and swelling  Skin: Negative.   Neurological: Negative.   Endo/Heme/Allergies: Negative.   Psychiatric/Behavioral: Negative.    Blood pressure 105/67, pulse 73, temperature 98 F (36.7 C), temperature source Oral, resp. rate 18, height 5\' 8"  (1.727 m), weight 188.3 kg (415 lb 2 oz), last menstrual period 10/29/2011, SpO2 97.00%. Physical Exam  Constitutional: She is oriented to person, place, and time. She appears well-developed and well-nourished.       Morbidly obese  HENT:  Head: Normocephalic and atraumatic.  Mouth/Throat: No oropharyngeal exudate.  Eyes: Conjunctivae and EOM are normal. Pupils are equal, round, and reactive to light. No scleral icterus.  Neck: Normal range of motion. Neck supple. No tracheal deviation present. No thyromegaly present.  Cardiovascular: Normal rate, regular rhythm and normal heart sounds.   Respiratory: Effort normal and breath sounds normal. No respiratory distress.  GI: Soft. Bowel sounds are normal. She exhibits no distension. There is no tenderness.  Musculoskeletal: Normal range of motion.  Lymphadenopathy:    She has no cervical adenopathy.  Neurological: She is alert and oriented to person, place, and time.  Skin: Skin is warm and dry.       Right foot demonstrates a large bullous lesion along the lateral aspect of the foot. Somewhat tender at around the surrounding area. Mild erythema. No palpable mass or foreign body is appreciated. No drainage is noted. 2+ dorsalis pedis and posterior tibialis pulses.    Assessment/Plan: Right foot foreign body. Given the appearance I do suspect patient has an underlying abscess or at least a reaction to the foreign body. I would continue the patient on to flare at this time as she has apparently had good response to this and given location would want to cover both MRSA as well as gram-negative organisms. Additionally I discussed with the patient lancing the bullous lesion at this time though I feel  this would be of little benefit and I do feel the patient would require a more thorough debridement and washout in the operating room. Unfortunately her foot is not acutely threatened and I do not feel this elicits an emergent trip to the operating room in the next 24 hours. She understands and we will place her on the schedule for Monday morning for debridement and attempts to localize the foreign body. Additionally I did discuss with her indications for removal of the foreign body. I also discussed with her the likely difficulty in locating the glass material. She understands this and understands plans for proceeding. Continue to follow her and again continue the antibiotics at this time.  Jaisen Wiltrout C 01/06/2012, 3:13 PM

## 2012-01-07 LAB — SURGICAL PCR SCREEN
MRSA, PCR: NEGATIVE
Staphylococcus aureus: NEGATIVE

## 2012-01-07 MED ORDER — SODIUM CHLORIDE 0.9 % IJ SOLN
INTRAMUSCULAR | Status: AC
  Administered 2012-01-07: 3 mL
  Filled 2012-01-07: qty 3

## 2012-01-07 NOTE — Progress Notes (Signed)
Subjective: No new complaints.  Objective: Vital signs in last 24 hours: Filed Vitals:   01/06/12 1455 01/06/12 2118 01/06/12 2143 01/07/12 0603  BP: 105/67 145/90  111/69  Pulse: 73 93  71  Temp: 98 F (36.7 C)  98 F (36.7 C) 98.2 F (36.8 C)  TempSrc: Oral  Oral Oral  Resp: 18 18  16   Height:      Weight:      SpO2: 97% 97%  95%   Weight change:   Intake/Output Summary (Last 24 hours) at 01/07/12 1350 Last data filed at 01/07/12 1213  Gross per 24 hour  Intake    980 ml  Output   1000 ml  Net    -20 ml   Physical Exam: Foot less swollen. Less warmth and tenderness. Bullous area about the same.  Lab Results: Basic Metabolic Panel:  Lab 01/05/12 2952  NA 134*  K 3.7  CL 99  CO2 30  GLUCOSE 83  BUN 8  CREATININE 0.75  CALCIUM 9.5  MG --  PHOS --   Liver Function Tests: No results found for this basename: AST:2,ALT:2,ALKPHOS:2,BILITOT:2,PROT:2,ALBUMIN:2 in the last 168 hours No results found for this basename: LIPASE:2,AMYLASE:2 in the last 168 hours No results found for this basename: AMMONIA:2 in the last 168 hours CBC:  Lab 01/05/12 1625  WBC 6.2  NEUTROABS 2.2  HGB 14.1  HCT 42.1  MCV 86.8  PLT 261    Micro Results: No results found for this or any previous visit (from the past 240 hour(s)). Studies/Results: Dg Foot Complete Right  01/05/2012  *RADIOLOGY REPORT*  Clinical Data: Swelling.  History of glass in the foot.  RIGHT FOOT COMPLETE - 3+ VIEW  Comparison: 12/30/2011.  03/17/2005.  Findings: There is focal lateral soft tissue swelling adjacent to the base of the fifth metatarsal.  This appears to be quite superficial.  There is also some generalized soft tissue swelling of the forefoot.  On the lateral view, there are small linear radio opacities projected in or over the swollen dorsal soft tissues.  I can count three of these .  The nature is uncertain.  They are not present previously.  I cannot see them on either of the other two views.  In  the region of the lateral swelling, there are two tiny densities marked with arrows that I think are artifactual based on change in position.  The patient has a degree of flat foot with some navicular osteophyte formation  IMPRESSION: Dorsal soft tissue swelling.  Three small linear opacities seen projected over the swollen soft tissues on the lateral view.  The nature of these is uncertain.  They could be foreign objects.  They are not seen on the previous lateral view.  I cannot see them on the other views taken today.  Therefore, it is possible that they are artifact on the patient.  Lateral soft tissue swelling which appears superficial.  No definite foreign object.  Tiny densities in the region are not consistently positioned and are probably artifact.  Original Report Authenticated By: Thomasenia Sales, M.D.   Scheduled Meds:    . amLODipine  10 mg Oral Daily  . docusate sodium  100 mg Oral BID  . enalapril  10 mg Oral BID  . heparin subcutaneous  5,000 Units Subcutaneous Q8H  . losartan  75 mg Oral Daily  . norethindrone  5 mg Oral Daily  . sodium chloride      . vancomycin  2,000 mg  Intravenous Q12H  . DISCONTD: enoxaparin  40 mg Subcutaneous Once   Continuous Infusions:  PRN Meds:.HYDROcodone-acetaminophen, ibuprofen, zolpidem Assessment/Plan: Principal Problem:  *Cellulitis and abscess of foot Active Problems:  Benign hypertension  Morbid obesity  Patient's cellulitis has improved on vancomycin. To the OR tomorrow for exploration for foreign body/IND of possible abscess.  . LOS: 2 days   Joniah Bednarski L 01/07/2012, 1:50 PM

## 2012-01-07 NOTE — Progress Notes (Signed)
Subjective: NO acute change.    Objective: Vital signs in last 24 hours: Temp:  [98 F (36.7 C)-98.2 F (36.8 C)] 98.2 F (36.8 C) (03/17 0603) Pulse Rate:  [71-93] 71  (03/17 0603) Resp:  [16-18] 16  (03/17 0603) BP: (105-145)/(67-90) 111/69 mmHg (03/17 0603) SpO2:  [95 %-97 %] 95 % (03/17 0603) Last BM Date: 01/07/12  Intake/Output from previous day: 03/16 0701 - 03/17 0700 In: 720 [P.O.:720] Out: 1300 [Urine:1300] Intake/Output this shift: Total I/O In: 240 [P.O.:240] Out: -   Extremities: Right foot swelling.  tenderness around lateral bulla.  No discharge.  Lab Results:   [facility] 01/05/12 1625  WBC 6.2  HGB 14.1  HCT 42.1  PLT 261   BMET  Basename 01/05/12 1625  NA 134*  K 3.7  CL 99  CO2 30  GLUCOSE 83  BUN 8  CREATININE 0.75  CALCIUM 9.5   PT/INR No results found for this basename: LABPROT:2,INR:2 in the last 72 hours ABG No results found for this basename: PHART:2,PCO2:2,PO2:2,HCO3:2 in the last 72 hours  Studies/Results: Dg Foot Complete Right  01/05/2012  *RADIOLOGY REPORT*  Clinical Data: Swelling.  History of glass in the foot.  RIGHT FOOT COMPLETE - 3+ VIEW  Comparison: 12/30/2011.  03/17/2005.  Findings: There is focal lateral soft tissue swelling adjacent to the base of the fifth metatarsal.  This appears to be quite superficial.  There is also some generalized soft tissue swelling of the forefoot.  On the lateral view, there are small linear radio opacities projected in or over the swollen dorsal soft tissues.  I can count three of these .  The nature is uncertain.  They are not present previously.  I cannot see them on either of the other two views.  In the region of the lateral swelling, there are two tiny densities marked with arrows that I think are artifactual based on change in position.  The patient has a degree of flat foot with some navicular osteophyte formation  IMPRESSION: Dorsal soft tissue swelling.  Three small linear opacities  seen projected over the swollen soft tissues on the lateral view.  The nature of these is uncertain.  They could be foreign objects.  They are not seen on the previous lateral view.  I cannot see them on the other views taken today.  Therefore, it is possible that they are artifact on the patient.  Lateral soft tissue swelling which appears superficial.  No definite foreign object.  Tiny densities in the region are not consistently positioned and are probably artifact.  Original Report Authenticated By: Thomasenia Sales, M.D.    Anti-infectives: Anti-infectives     Start     Dose/Rate Route Frequency Ordered Stop   01/06/12 0900   vancomycin (VANCOCIN) 2,000 mg in sodium chloride 0.9 % 500 mL IVPB        2,000 mg 250 mL/hr over 120 Minutes Intravenous Every 12 hours 01/05/12 1938     01/05/12 2100   vancomycin (VANCOCIN) IVPB 1000 mg/200 mL premix        1,000 mg 200 mL/hr over 60 Minutes Intravenous  Once 01/05/12 1933 01/06/12 0045   01/05/12 1615   vancomycin (VANCOCIN) IVPB 1000 mg/200 mL premix        1,000 mg 200 mL/hr over 60 Minutes Intravenous  Once 01/05/12 1613 01/05/12 1733          Assessment/Plan: s/p Procedure(s) (LRB): IRRIGATION AND DEBRIDEMENT ABSCESS (Right) To the operating room in AM for  debridement and exploration for possible foreign body.  LOS: 2 days    Hannah Ryan C 01/07/2012

## 2012-01-07 NOTE — Progress Notes (Signed)
01/07/12 1715 Patient c/o IV site being uncomfortable this afternoon, since located in her left a/c. Site flushes well, good blood return, new dressing applied. Discussed option of new IV attempt with patient, stated was okay. When nursing came to attempt new access, patient declined. Nursing to monitor.  01/07/12 1717 surgical PCR obtained and sent to lab this afternoon, scheduled for surgery tomorrow.

## 2012-01-08 ENCOUNTER — Encounter (HOSPITAL_COMMUNITY): Payer: Self-pay | Admitting: Anesthesiology

## 2012-01-08 ENCOUNTER — Encounter (HOSPITAL_COMMUNITY)
Admission: EM | Disposition: A | Discharge status: Home / Self Care | Payer: Self-pay | Source: Home / Self Care | Attending: Internal Medicine

## 2012-01-08 ENCOUNTER — Encounter (HOSPITAL_COMMUNITY): Payer: Self-pay | Admitting: *Deleted

## 2012-01-08 ENCOUNTER — Inpatient Hospital Stay (HOSPITAL_COMMUNITY): Payer: Medicaid Other | Admitting: Anesthesiology

## 2012-01-08 HISTORY — PX: IRRIGATION AND DEBRIDEMENT ABSCESS: SHX5252

## 2012-01-08 LAB — BASIC METABOLIC PANEL
BUN: 10 mg/dL (ref 6–23)
CO2: 26 mEq/L (ref 19–32)
Glucose, Bld: 94 mg/dL (ref 70–99)
Potassium: 4.1 mEq/L (ref 3.5–5.1)
Sodium: 137 mEq/L (ref 135–145)

## 2012-01-08 LAB — CBC
HCT: 40.9 % (ref 36.0–46.0)
Hemoglobin: 13.6 g/dL (ref 12.0–15.0)
MCH: 29.1 pg (ref 26.0–34.0)
RBC: 4.68 MIL/uL (ref 3.87–5.11)

## 2012-01-08 SURGERY — IRRIGATION AND DEBRIDEMENT ABSCESS
Anesthesia: General | Site: Foot | Laterality: Right | Wound class: Dirty or Infected

## 2012-01-08 MED ORDER — LACTATED RINGERS IV SOLN
INTRAVENOUS | Status: DC | PRN
  Administered 2012-01-08 (×2): via INTRAVENOUS

## 2012-01-08 MED ORDER — BUPIVACAINE HCL (PF) 0.5 % IJ SOLN
INTRAMUSCULAR | Status: DC | PRN
  Administered 2012-01-08: 10 mL

## 2012-01-08 MED ORDER — LACTATED RINGERS IV SOLN
INTRAVENOUS | Status: DC
  Administered 2012-01-08 (×2): 1000 mL via INTRAVENOUS

## 2012-01-08 MED ORDER — ONDANSETRON 4 MG PO TBDP: 4.0000 mg | ORAL_TABLET | Freq: Three times a day (TID) | ORAL | Status: DC | PRN

## 2012-01-08 MED ORDER — SUCCINYLCHOLINE CHLORIDE 20 MG/ML IJ SOLN
INTRAMUSCULAR | Status: DC | PRN
  Administered 2012-01-08: 60 mg via INTRAVENOUS
  Administered 2012-01-08: 140 mg via INTRAVENOUS
  Administered 2012-01-08: 60 mg via INTRAVENOUS

## 2012-01-08 MED ORDER — HYDROCODONE-ACETAMINOPHEN 5-325 MG PO TABS: 1.0000 | ORAL_TABLET | ORAL | Status: AC | PRN

## 2012-01-08 MED ORDER — MIDAZOLAM HCL 2 MG/2ML IJ SOLN
INTRAMUSCULAR | Status: AC
  Filled 2012-01-08: qty 2

## 2012-01-08 MED ORDER — PROPOFOL 10 MG/ML IV EMUL
INTRAVENOUS | Status: AC
  Filled 2012-01-08: qty 20

## 2012-01-08 MED ORDER — FENTANYL CITRATE 0.05 MG/ML IJ SOLN
INTRAMUSCULAR | Status: AC
  Administered 2012-01-08: 50 ug via INTRAVENOUS
  Filled 2012-01-08: qty 2

## 2012-01-08 MED ORDER — FENTANYL CITRATE 0.05 MG/ML IJ SOLN
INTRAMUSCULAR | Status: DC | PRN
  Administered 2012-01-08 (×3): 50 ug via INTRAVENOUS
  Administered 2012-01-08 (×2): 25 ug via INTRAVENOUS

## 2012-01-08 MED ORDER — PROPOFOL 10 MG/ML IV EMUL
INTRAVENOUS | Status: AC
  Filled 2012-01-08: qty 40

## 2012-01-08 MED ORDER — SUCCINYLCHOLINE CHLORIDE 20 MG/ML IJ SOLN
INTRAMUSCULAR | Status: AC
  Filled 2012-01-08: qty 1

## 2012-01-08 MED ORDER — HYDROCODONE-ACETAMINOPHEN 5-325 MG PO TABS: 1.0000 | ORAL_TABLET | ORAL | Status: DC | PRN

## 2012-01-08 MED ORDER — LIDOCAINE HCL (PF) 1 % IJ SOLN
INTRAMUSCULAR | Status: AC
  Filled 2012-01-08: qty 2

## 2012-01-08 MED ORDER — ONDANSETRON HCL 4 MG/2ML IJ SOLN: 4.0000 mg | Freq: Once | INTRAMUSCULAR | Status: DC | PRN

## 2012-01-08 MED ORDER — 0.9 % SODIUM CHLORIDE (POUR BTL) OPTIME
TOPICAL | Status: DC | PRN
  Administered 2012-01-08: 1000 mL

## 2012-01-08 MED ORDER — MIDAZOLAM HCL 2 MG/2ML IJ SOLN
INTRAMUSCULAR | Status: AC
  Administered 2012-01-08: 2 mg via INTRAVENOUS
  Filled 2012-01-08: qty 2

## 2012-01-08 MED ORDER — LIDOCAINE HCL (PF) 1 % IJ SOLN
INTRAMUSCULAR | Status: AC
  Filled 2012-01-08: qty 5

## 2012-01-08 MED ORDER — DOXYCYCLINE HYCLATE 100 MG PO TABS: 100.0000 mg | ORAL_TABLET | Freq: Two times a day (BID) | ORAL | Status: AC

## 2012-01-08 MED ORDER — MIDAZOLAM HCL 2 MG/2ML IJ SOLN
1.0000 mg | INTRAMUSCULAR | Status: DC | PRN
  Administered 2012-01-08 (×2): 2 mg via INTRAVENOUS

## 2012-01-08 MED ORDER — FENTANYL CITRATE 0.05 MG/ML IJ SOLN
25.0000 ug | INTRAMUSCULAR | Status: DC | PRN
  Administered 2012-01-08 (×4): 50 ug via INTRAVENOUS

## 2012-01-08 MED ORDER — BUPIVACAINE HCL (PF) 0.5 % IJ SOLN
INTRAMUSCULAR | Status: AC
  Filled 2012-01-08: qty 30

## 2012-01-08 MED ORDER — LIDOCAINE HCL (CARDIAC) 10 MG/ML IV SOLN
INTRAVENOUS | Status: DC | PRN
  Administered 2012-01-08: 50 mg via INTRAVENOUS

## 2012-01-08 MED ORDER — PROPOFOL 10 MG/ML IV EMUL
INTRAVENOUS | Status: DC | PRN
  Administered 2012-01-08: 100 mg via INTRAVENOUS
  Administered 2012-01-08: 200 mg via INTRAVENOUS

## 2012-01-08 MED ORDER — FENTANYL CITRATE 0.05 MG/ML IJ SOLN
INTRAMUSCULAR | Status: AC
  Filled 2012-01-08: qty 2

## 2012-01-08 SURGICAL SUPPLY — 35 items
APPLICATOR COTTON TIP 6IN STRL (MISCELLANEOUS) IMPLANT
BAG HAMPER (MISCELLANEOUS) ×2 IMPLANT
BANDAGE GAUZE ELAST BULKY 4 IN (GAUZE/BANDAGES/DRESSINGS) ×2 IMPLANT
BLADE SURG 15 STRL LF DISP TIS (BLADE) ×1 IMPLANT
BLADE SURG 15 STRL SS (BLADE) ×1
CLOTH BEACON ORANGE TIMEOUT ST (SAFETY) ×2 IMPLANT
COVER SURGICAL LIGHT HANDLE (MISCELLANEOUS) ×4 IMPLANT
DECANTER SPIKE VIAL GLASS SM (MISCELLANEOUS) ×2 IMPLANT
ELECT REM PT RETURN 9FT ADLT (ELECTROSURGICAL) ×2
ELECTRODE REM PT RTRN 9FT ADLT (ELECTROSURGICAL) ×1 IMPLANT
GAUZE PACKING IODOFORM 1 (PACKING) IMPLANT
GLOVE BIOGEL PI IND STRL 7.5 (GLOVE) ×1 IMPLANT
GLOVE BIOGEL PI INDICATOR 7.5 (GLOVE) ×1
GLOVE ECLIPSE 7.0 STRL STRAW (GLOVE) ×2 IMPLANT
GOWN STRL REIN XL XLG (GOWN DISPOSABLE) ×4 IMPLANT
KIT ROOM TURNOVER APOR (KITS) ×2 IMPLANT
MANIFOLD NEPTUNE II (INSTRUMENTS) ×2 IMPLANT
MARKER SKIN DUAL TIP RULER LAB (MISCELLANEOUS) ×2 IMPLANT
NEEDLE HYPO 25X1 1.5 SAFETY (NEEDLE) ×2 IMPLANT
NS IRRIG 1000ML POUR BTL (IV SOLUTION) ×2 IMPLANT
PACK BASIC LIMB (CUSTOM PROCEDURE TRAY) ×2 IMPLANT
PACK MINOR (CUSTOM PROCEDURE TRAY) IMPLANT
PAD ARMBOARD 7.5X6 YLW CONV (MISCELLANEOUS) ×2 IMPLANT
SET BASIN LINEN APH (SET/KITS/TRAYS/PACK) ×2 IMPLANT
SOL PREP PROV IODINE SCRUB 4OZ (MISCELLANEOUS) ×2 IMPLANT
SPONGE GAUZE 2X2 8PLY STRL LF (GAUZE/BANDAGES/DRESSINGS) IMPLANT
SPONGE GAUZE 4X4 12PLY (GAUZE/BANDAGES/DRESSINGS) ×2 IMPLANT
SUT MON AB 2-0 SH 27 (SUTURE)
SUT MON AB 2-0 SH27 (SUTURE) IMPLANT
SUT MON AB 3-0 SH 27 (SUTURE) IMPLANT
SUT VIC AB 4-0 PS2 27 (SUTURE) IMPLANT
SWAB CULTURE LIQ STUART DBL (MISCELLANEOUS) ×2 IMPLANT
SYR BULB IRRIGATION 50ML (SYRINGE) ×2 IMPLANT
SYR CONTROL 10ML LL (SYRINGE) ×2 IMPLANT
TUBE ANAEROBIC PORT A CUL  W/M (MISCELLANEOUS) ×2 IMPLANT

## 2012-01-08 NOTE — Preoperative (Signed)
Beta Blockers   Reason not to administer Beta Blockers:Not Applicable 

## 2012-01-08 NOTE — Progress Notes (Signed)
Pt received for PACU in stable condition  Filed Vitals:   01/08/12 1205  BP: 138/58  Pulse: 107  Temp: 98.3 F (36.8 C)  Resp: 18   Pt sleepy with no complaints at this time will continue to monitor.

## 2012-01-08 NOTE — Anesthesia Postprocedure Evaluation (Signed)
  Anesthesia Post-op Note  Patient: Hannah Ryan  Procedure(s) Performed: Procedure(s) (LRB): IRRIGATION AND DEBRIDEMENT ABSCESS (Right)  Patient Location: PACU  Anesthesia Type: General  Level of Consciousness: awake, alert , oriented and patient cooperative  Airway and Oxygen Therapy: Patient Spontanous Breathing and Patient connected to face mask oxygen  Post-op Pain: mild  Post-op Assessment: Post-op Vital signs reviewed, Patient's Cardiovascular Status Stable, Respiratory Function Stable and Patent Airway  Post-op Vital Signs: Reviewed and stable  Complications: No apparent anesthesia complications

## 2012-01-08 NOTE — Op Note (Signed)
Patient:  Hannah Ryan  DOB:  Oct 21, 1991  MRN:  [id number]   Preop Diagnosis:  Right foot abscess  Postop Diagnosis:  The same  Procedure:  Irrigation and sharp surgical debridement of right foot abscess and wound  Surgeon:  Dr. Tilford Pillar  Anes:  General endotracheal, 0.5% Sensorcaine plain for local anesthetic  Indications:  Patient is a 21 year old female presented to [facility] with a history of morbid obesity and an episode of stepping on glass. This occurred approximately 2 weeks ago. Over that period time she had a large swollen area in the right foot and increase in pain. She is unsure of the size of glass that may be located in her foot. She's had exquisite tenderness. She's also had a large area of swelling. Evaluation was consistent for a right foot abscess is suspected foreign body. Risks benefits alternatives of irrigation and debridement of the wound were discussed. Additionally possible inability to find the glass was discussed with the patient. Her questions and concerns were addressed the patient was consented for planned procedure.  Procedure note:  Patient is taken to the operating room placed supine position on the operating table time the general anesthesia is administered. A larger mask airway is placed due to difficulty obtaining 8 endotracheal airway. After securing airway the patient's right foot and leg are prepped with Betadine solution and draped in standard fashion. A scalpel was utilized to create the initial incision the large bulla over the right lateral aspect of the foot. A large amount of purulent this is encountered. This is noted to be under pressure. Cultures are obtained for both aerobic and anaerobic cultures. The overlying skin over the bulla is sharply excised using a 15 blade scalpel. This exposes a large circular granulation bed onto the bulb. This is debrided. Palpation does not elicit any evidence of any large piece of glass or foreign  body. Upon further debridement there is no evidence of any retained foreign body that I can appreciate. The wound is widely open and wall drain. It is irrigated. 4 x 4 gauze dressings were placed and secured with a Kerlix roll. The patient was allowed to come out of general anesthetic is transferred to the PACU in stable condition. At the conclusion of procedure all instrument, sponge, needle counts are correct. Patient tolerated procedure well.  Complications:  None apparent  EBL:  Less than 50 ML's  Specimen:  Aerobic and anaerobic cultures

## 2012-01-08 NOTE — Transfer of Care (Signed)
Immediate Anesthesia Transfer of Care Note  Patient: Hannah Ryan  Procedure(s) Performed: Procedure(s) (LRB): IRRIGATION AND DEBRIDEMENT ABSCESS (Right)  Patient Location: PACU  Anesthesia Type: General  Level of Consciousness: awake, alert , oriented and patient cooperative  Airway & Oxygen Therapy: Patient Spontanous Breathing and Patient connected to face mask oxygen  Post-op Assessment: Report given to PACU RN and Post -op Vital signs reviewed and stable  Post vital signs: Reviewed and stable  Complications: No apparent anesthesia complications

## 2012-01-08 NOTE — Anesthesia Procedure Notes (Signed)
Procedure Name: LMA Insertion Date/Time: 01/08/2012 10:16 AM Performed by: Carolyne Littles, Lorrinda Ramstad L Pre-anesthesia Checklist: Patient identified, Timeout performed, Emergency Drugs available, Suction available and Patient being monitored Patient Re-evaluated:Patient Re-evaluated prior to inductionOxygen Delivery Method: Circle system utilized Preoxygenation: Pre-oxygenation with 100% oxygen Intubation Type: IV induction, Rapid sequence and Cricoid Pressure applied Ventilation: Mask ventilation without difficulty LMA: LMA inserted LMA Size: 4.0 Number of attempts: 1 Placement Confirmation: positive ETCO2 and breath sounds checked- equal and bilateral Tube secured with: Tape Dental Injury: Teeth and Oropharynx as per pre-operative assessment  Comments: Unable to easily visualize vocal cords with glidescope; LMA placed without difficulty.

## 2012-01-08 NOTE — Progress Notes (Signed)
Per phone conversation, Md Leticia Penna would like pt to follow up in his office in one week.  Appointment scheduled 01/16/2012 10:00AM.  He would like dressing changed daily wet to dry.

## 2012-01-08 NOTE — Anesthesia Preprocedure Evaluation (Signed)
Anesthesia Evaluation  Patient identified by MRN, date of birth, ID band Patient awake    Reviewed: Allergy & Precautions, H&P , NPO status , Patient's Chart, lab work & pertinent test results  History of Anesthesia Complications Negative for: history of anesthetic complications  Airway Mallampati: III      Dental  (+) Teeth Intact   Pulmonary neg pulmonary ROS,  breath sounds clear to auscultation        Cardiovascular hypertension, Pt. on medications Rhythm:Regular     Neuro/Psych PSYCHIATRIC DISORDERS Anxiety Depression    GI/Hepatic   Endo/Other  Morbid obesity  Renal/GU      Musculoskeletal   Abdominal (+) + obese,   Peds  Hematology   Anesthesia Other Findings   Reproductive/Obstetrics                           Anesthesia Physical Anesthesia Plan  ASA: III  Anesthesia Plan: General   Post-op Pain Management:    Induction: Intravenous, Rapid sequence and Cricoid pressure planned  Airway Management Planned: Oral ETT  Additional Equipment:   Intra-op Plan:   Post-operative Plan: Extubation in OR  Informed Consent: I have reviewed the patients History and Physical, chart, labs and discussed the procedure including the risks, benefits and alternatives for the proposed anesthesia with the patient or authorized representative who has indicated his/her understanding and acceptance.     Plan Discussed with:   Anesthesia Plan Comments:         Anesthesia Quick Evaluation

## 2012-01-08 NOTE — Progress Notes (Signed)
Day of Surgery  Subjective: Comfortable.  NO acute change.  Objective: Vital signs in last 24 hours: Temp:  [97.8 F (36.6 Ryan)-98.2 F (36.8 Ryan)] 97.8 F (36.6 Ryan) (03/18 0810) Pulse Rate:  [68-86] 81  (03/18 0810) Resp:  [18] 18  (03/18 0810) BP: (104-135)/(63-88) 125/88 mmHg (03/18 0810) SpO2:  [95 %-100 %] 100 % (03/18 0810) Last BM Date: 01/07/12  Intake/Output from previous day: 03/17 0701 - 03/18 0700 In: 1460 [P.O.:960; IV Piggyback:500] Out: 1000 [Urine:1000] Intake/Output this shift:    General appearance: Comfortable.  No acute distress. Extremities: Right foot bulla.  Decreased but still present erythema and tenderness.  No creptience.  Lab Results:   Basename 01/08/12 0617 01/05/12 1625  WBC 5.9 6.2  HGB 13.6 14.1  HCT 40.9 42.1  PLT 248 261   BMET  Basename 01/08/12 0617 01/05/12 1625  NA 137 134*  K 4.1 3.7  CL 104 99  CO2 26 30  GLUCOSE 94 83  BUN 10 8  CREATININE 0.70 0.75  CALCIUM 9.2 9.5   PT/INR No results found for this basename: LABPROT:2,INR:2 in the last 72 hours ABG No results found for this basename: PHART:2,PCO2:2,PO2:2,HCO3:2 in the last 72 hours  Studies/Results: No results found.  Anti-infectives: Anti-infectives     Start     Dose/Rate Route Frequency Ordered Stop   01/06/12 0900   vancomycin (VANCOCIN) 2,000 mg in sodium chloride 0.9 % 500 mL IVPB        2,000 mg 250 mL/hr over 120 Minutes Intravenous Every 12 hours 01/05/12 1938     01/05/12 2100   vancomycin (VANCOCIN) IVPB 1000 mg/200 mL premix        1,000 mg 200 mL/hr over 60 Minutes Intravenous  Once 01/05/12 1933 01/06/12 0045   01/05/12 1615   vancomycin (VANCOCIN) IVPB 1000 mg/200 mL premix        1,000 mg 200 mL/hr over 60 Minutes Intravenous  Once 01/05/12 1613 01/05/12 1733          Assessment/Plan: s/p Procedure(s) (LRB): IRRIGATION AND DEBRIDEMENT ABSCESS (Right) To OR as discussed with the patient.  Risks, benefits, alternatives again discussed.      LOS: 3 days    Hannah Ryan 01/08/2012

## 2012-01-08 NOTE — Discharge Summary (Signed)
Physician Discharge Summary  Hannah Ryan MRN: [id number] DOB/AGE: 11-Feb-1991 21 y.o.  PCP: No primary provider on file.   Admit date: 01/31/12 Discharge date: 01/08/2012  Discharge Diagnoses:  1. Cellulitis and abscess of the right foot. Status post irrigation and sharp surgical debridement by Dr. Tilford Pillar on March 18th 2013. Culture results pending. 2. Hypertension. Stable. 3. Morbid obesity. 4. Mild hyponatremia. Resolved. Principal Problem:  *Cellulitis and abscess of foot Active Problems:  Benign hypertension  Morbid obesity   Medication List  As of 01/08/2012  4:00 PM   STOP taking these medications         cephALEXin 500 MG capsule      ibuprofen 800 MG tablet      metFORMIN 500 MG tablet         TAKE these medications         amLODipine 10 MG tablet   Commonly known as: NORVASC   Take 10 mg by mouth every morning.      doxycycline 100 MG tablet   Commonly known as: VIBRA-TABS   Take 1 tablet (100 mg total) by mouth 2 (two) times daily. FOR FIVE MORE DAYS.      enalapril 10 MG tablet   Commonly known as: VASOTEC   Take 10 mg by mouth 2 (two) times daily.      HYDROcodone-acetaminophen 5-325 MG per tablet   Commonly known as: NORCO   Take 1-2 tablets by mouth every 4 (four) hours as needed.      losartan 50 MG tablet   Commonly known as: COZAAR   Take 75 mg by mouth every morning.      norethindrone 5 MG tablet   Commonly known as: AYGESTIN   Take 5 mg by mouth every morning.            Discharge Condition: Improved and stable.  Disposition: 01-Home or Self Care   Consults: Tilford Pillar, M.D.   Significant Diagnostic Studies: Dg Foot Complete Right  January 31, 2012  *RADIOLOGY REPORT*  Clinical Data: Swelling.  History of glass in the foot.  RIGHT FOOT COMPLETE - 3+ VIEW  Comparison: 12/30/2011.  03/17/2005.  Findings: There is focal lateral soft tissue swelling adjacent to the base of the fifth metatarsal.  This appears to be  quite superficial.  There is also some generalized soft tissue swelling of the forefoot.  On the lateral view, there are small linear radio opacities projected in or over the swollen dorsal soft tissues.  I can count three of these .  The nature is uncertain.  They are not present previously.  I cannot see them on either of the other two views.  In the region of the lateral swelling, there are two tiny densities marked with arrows that I think are artifactual based on change in position.  The patient has a degree of flat foot with some navicular osteophyte formation  IMPRESSION: Dorsal soft tissue swelling.  Three small linear opacities seen projected over the swollen soft tissues on the lateral view.  The nature of these is uncertain.  They could be foreign objects.  They are not seen on the previous lateral view.  I cannot see them on the other views taken today.  Therefore, it is possible that they are artifact on the patient.  Lateral soft tissue swelling which appears superficial.  No definite foreign object.  Tiny densities in the region are not consistently positioned and are probably artifact.  Original Report Authenticated By:  Thomasenia Sales, M.D.   Dg Foot Complete Right  12/30/2011  *RADIOLOGY REPORT*  Clinical Data: Foot pain.  Stepped on a piece of glass 1 month ago.  RIGHT FOOT COMPLETE - 3+ VIEW  Comparison: Right foot radiographs dated 03/17/2005.  Findings: No radiopaque foreign body is noted.  No definite acute fracture, subluxation, dislocation, joint or soft tissue abnormality.  There is a slight irregularity of the medial head of the first metatarsal, which appears similar to prior study from 03/17/2005, and may relate to remote trauma.  IMPRESSION: 1.  No acute radiographic abnormality of the right foot. Specifically, no retained radiopaque foreign body identified.  Original Report Authenticated By: Florencia Reasons, M.D.     Microbiology: Recent Results (from the past 240 hour(s))    SURGICAL PCR SCREEN     Status: Normal   Collection Time   01/07/12  4:00 PM      Component Value Range Status Comment   MRSA, PCR NEGATIVE  NEGATIVE  Final    Staphylococcus aureus NEGATIVE  NEGATIVE  Final      Labs: Results for orders placed during the hospital encounter of 01/05/12 (from the past 48 hour(s))  SURGICAL PCR SCREEN     Status: Normal   Collection Time   01/07/12  4:00 PM      Component Value Range Comment   MRSA, PCR NEGATIVE  NEGATIVE     Staphylococcus aureus NEGATIVE  NEGATIVE    CBC     Status: Normal   Collection Time   01/08/12  6:17 AM      Component Value Range Comment   WBC 5.9  4.0 - 10.5 (K/uL)    RBC 4.68  3.87 - 5.11 (MIL/uL)    Hemoglobin 13.6  12.0 - 15.0 (g/dL)    HCT 29.5  62.1 - 30.8 (%)    MCV 87.4  78.0 - 100.0 (fL)    MCH 29.1  26.0 - 34.0 (pg)    MCHC 33.3  30.0 - 36.0 (g/dL)    RDW 65.7  84.6 - 96.2 (%)    Platelets 248  150 - 400 (K/uL)   BASIC METABOLIC PANEL     Status: Normal   Collection Time   01/08/12  6:17 AM      Component Value Range Comment   Sodium 137  135 - 145 (mEq/L)    Potassium 4.1  3.5 - 5.1 (mEq/L)    Chloride 104  96 - 112 (mEq/L)    CO2 26  19 - 32 (mEq/L)    Glucose, Bld 94  70 - 99 (mg/dL)    BUN 10  6 - 23 (mg/dL)    Creatinine, Ser 9.52  0.50 - 1.10 (mg/dL)    Calcium 9.2  8.4 - 10.5 (mg/dL)    GFR calc non Af Amer >90  >90 (mL/min)    GFR calc Af Amer >90  >90 (mL/min)      HPI : The patient is a 21 year old woman with a past medical history significant for hypertension, who presented to the emergency department on March 15th 2013 with a chief complaint of right foot pain and swelling. Approximately 1 month ago, she stepped on some glass and injured her right foot. It began to swell. She presented originally to the emergency department on December 30, 2011. X-rays were taken of her foot. It revealed no foreign body. However, she was started on Keflex and a pain medication and discharged home. During her  presentation on  March 15th 2013, she was noted to be afebrile and hemodynamically stable. The x-ray of her foot revealed lateral soft tissue swelling which appeared to be superficial and no definite foreign body. The tiny densities in the region were not consistently positioned and were probably artifact. Her white blood cell count was within normal limits. She was admitted for further evaluation and management.  HOSPITAL COURSE: The patient was started empirically on vancomycin. Her pain was treated with as needed analgesics. Her chronic medications were continued with exception of Keflex. General surgery consultation was ordered. Dr. Leticia Penna provided the initial consultation. Per his assessment, the right foot demonstrated a large bullous lesion along the lateral aspect of the foot, but there was no palpable mass or foreign body appreciated. He recommended surgical debridement. The patient was taken to the operating room today. Per Dr. Leticia Penna, irrigation and sharp surgical debridement of the right foot abscess and wound was performed. The operation was successful. Postoperatively, the patient remained afebrile and hemodynamically stable. She received 2-1/2 days of IV vancomycin. She was discharged to home on 5 more days of oral doxycycline. Dr. Leticia Penna recommended wet to dry dressing changes twice daily. She was advised to use a cane to ambulate and to weight-bear as tolerated. She was instructed to keep her right leg elevated when she was not standing. Home health nursing was ordered to assist with wound care and dressing changes. She was discharged to home in improved and stable condition.   Discharge Exam: Blood pressure 121/76, pulse 78, temperature 98.4 F (36.9 C), temperature source Oral, resp. rate 18, height 5\' 8"  (1.727 m), weight 188.3 kg (415 lb 2 oz), last menstrual period 10/29/2011, SpO2 100.00%.  Lungs: Clear to auscultation bilaterally. Heart: S1, S2, with no murmurs rubs  gallops. Abdomen: Positive bowel sounds, soft, nontender, nondistended. Extremities: Bandage on right foot, not taken off. It was clean and dry. Trace of right pretibial edema. No edema on the left foot.   Discharge Orders    Future Orders Please Complete By Expires   Diet - low sodium heart healthy      Increase activity slowly      Change dressing (specify)      Comments:   Dressing change: WET TO DRY 2 TIMES DAILY. WEIGHT BEARING AS TOLERATED. KEEP LEG ELEVATED WHEN NOT STANDING.      Follow-up Information    Follow up with Fabio Bering, MD on 01/16/2012. (01/16/2012 at 10:00AM  Office number: [phone])           Total discharge time: 35 minutes.  Signed: Chelsie Burel 01/08/2012, 4:00 PM

## 2012-01-08 NOTE — Progress Notes (Signed)
Patient discharged home via family; Pt given and explained discharge instructions, prescriptions, carenotes; stated understanding and denied questions; pts IV removed without problems by NT; pt stable at time of discharge

## 2012-01-08 NOTE — Progress Notes (Signed)
CARE MANAGEMENT NOTE 01/08/2012  Patient:  Hannah Ryan, Hannah Ryan   Account Number:  [id number]  Date Initiated:  01/08/2012  Documentation initiated by:  Rosemary Holms  Subjective/Objective Assessment:   Pt admitted with cellulitis of R. foot. PTA lived with family.     Action/Plan:   Pt to DC home with HH.   Anticipated DC Date:  01/08/2012   Anticipated DC Plan:  HOME W HOME HEALTH SERVICES      DC Planning Services  CM consult      Choice offered to / List presented to:          [facility] arranged  HH-1 RN      [facility] agency  Care [facility]   Status of service:  Completed, signed off Medicare Important Message given?   (If response is "NO", the following Medicare IM given date fields will be blank) Date Medicare IM given:   Date Additional Medicare IM given:    Discharge Disposition:  HOME W HOME HEALTH SERVICES  Per UR Regulation:    If discussed at Long Length of Stay Meetings, dates discussed:    Comments:  01/08/12 1300 Sharrieff Spratlin Leanord Hawking RN BSN CM

## 2012-01-08 NOTE — Progress Notes (Signed)
Reported off to barbara morris rn 

## 2012-01-09 ENCOUNTER — Encounter (HOSPITAL_COMMUNITY): Payer: Self-pay | Admitting: General Surgery

## 2012-01-09 NOTE — Anesthesia Postprocedure Evaluation (Signed)
  Anesthesia Post-op Note  Patient: Hannah Ryan  Procedure(s) Performed: Procedure(s) (LRB): IRRIGATION AND DEBRIDEMENT ABSCESS (Right)  Patient Location: room 3a  Anesthesia Type: General  Level of Consciousness: awake, alert , oriented and patient cooperative  Airway and Oxygen Therapy: Patient Spontanous Breathing  Post-op Pain: none  Post-op Assessment: Post-op Vital signs reviewed, Patient's Cardiovascular Status Stable, Respiratory Function Stable, Patent Airway, No signs of Nausea or vomiting and Pain level controlled  Post-op Vital Signs: Reviewed and stable  Complications: No apparent anesthesia complications

## 2012-01-09 NOTE — Addendum Note (Signed)
Addendum  created 01/09/12 1610 by Despina Hidden, CRNA   Modules edited:Notes Section

## 2012-01-11 LAB — CULTURE, ROUTINE-ABSCESS: Gram Stain: NONE SEEN

## 2012-01-14 LAB — ANAEROBIC CULTURE: Gram Stain: NONE SEEN

## 2012-02-11 ENCOUNTER — Emergency Department (HOSPITAL_COMMUNITY)
Admission: EM | Admit: 2012-02-11 | Discharge: 2012-02-11 | Disposition: A | Discharge status: Home / Self Care | Payer: Medicaid Other | Attending: Emergency Medicine | Admitting: Emergency Medicine

## 2012-02-11 ENCOUNTER — Encounter (HOSPITAL_COMMUNITY): Payer: Self-pay | Admitting: Emergency Medicine

## 2012-02-11 ENCOUNTER — Emergency Department (HOSPITAL_COMMUNITY): Payer: Medicaid Other

## 2012-02-11 DIAGNOSIS — M79609 Pain in unspecified limb: Secondary | ICD-10-CM | POA: Insufficient documentation

## 2012-02-11 DIAGNOSIS — M25569 Pain in unspecified knee: Secondary | ICD-10-CM | POA: Insufficient documentation

## 2012-02-11 DIAGNOSIS — T07XXXA Unspecified multiple injuries, initial encounter: Secondary | ICD-10-CM | POA: Insufficient documentation

## 2012-02-11 DIAGNOSIS — Y93K1 Activity, walking an animal: Secondary | ICD-10-CM | POA: Insufficient documentation

## 2012-02-11 DIAGNOSIS — W010XXA Fall on same level from slipping, tripping and stumbling without subsequent striking against object, initial encounter: Secondary | ICD-10-CM | POA: Insufficient documentation

## 2012-02-11 DIAGNOSIS — M25469 Effusion, unspecified knee: Secondary | ICD-10-CM | POA: Insufficient documentation

## 2012-02-11 DIAGNOSIS — F17200 Nicotine dependence, unspecified, uncomplicated: Secondary | ICD-10-CM | POA: Insufficient documentation

## 2012-02-11 DIAGNOSIS — F329 Major depressive disorder, single episode, unspecified: Secondary | ICD-10-CM | POA: Insufficient documentation

## 2012-02-11 DIAGNOSIS — F3289 Other specified depressive episodes: Secondary | ICD-10-CM | POA: Insufficient documentation

## 2012-02-11 DIAGNOSIS — I10 Essential (primary) hypertension: Secondary | ICD-10-CM | POA: Insufficient documentation

## 2012-02-11 DIAGNOSIS — F411 Generalized anxiety disorder: Secondary | ICD-10-CM | POA: Insufficient documentation

## 2012-02-11 DIAGNOSIS — IMO0002 Reserved for concepts with insufficient information to code with codable children: Secondary | ICD-10-CM | POA: Insufficient documentation

## 2012-02-11 MED ORDER — HYDROCODONE-ACETAMINOPHEN 5-325 MG PO TABS
1.0000 | ORAL_TABLET | Freq: Once | ORAL | Status: AC
  Administered 2012-02-11: 1 via ORAL
  Filled 2012-02-11: qty 1

## 2012-02-11 MED ORDER — IBUPROFEN 800 MG PO TABS: 800.0000 mg | ORAL_TABLET | Freq: Three times a day (TID) | ORAL | Status: AC

## 2012-02-11 MED ORDER — HYDROCODONE-ACETAMINOPHEN 5-325 MG PO TABS: 1.0000 | ORAL_TABLET | ORAL | Status: AC | PRN

## 2012-02-11 MED ORDER — IBUPROFEN 800 MG PO TABS
800.0000 mg | ORAL_TABLET | Freq: Once | ORAL | Status: AC
  Administered 2012-02-11: 800 mg via ORAL
  Filled 2012-02-11: qty 1

## 2012-02-11 NOTE — ED Notes (Signed)
Pt has noted small abrasion across the palm of rt hand. No bleeding noted. Rt foot, rt knee and pinkie toe have minimal swelling. No bruising noted. Pt continues to have full range of motion.

## 2012-02-11 NOTE — Discharge Instructions (Signed)

## 2012-02-11 NOTE — ED Notes (Signed)
Wound on rt hand.cleansed. Pt tolerated well. Dressing applied.

## 2012-02-11 NOTE — ED Notes (Signed)
Pt fell and injured right knee, fight pinkie toe and right hand has laceration.  No LOC

## 2012-02-13 NOTE — ED Provider Notes (Signed)
History     CSN: [id number]  Arrival date & time 02/11/12  2103   First MD Initiated Contact with Patient 02/11/12 2108      Chief Complaint  Patient presents with  . Knee Pain  . Foot Pain  . Laceration  . Fall    (Consider location/radiation/quality/duration/timing/severity/associated sxs/prior treatment) Patient is a 21 y.o. female presenting with fall. The history is provided by the patient.  Fall The accident occurred less than 1 hour ago. The fall occurred while walking (She tripped over a dog while walking him on a leash). She fell from a height of 1 to 2 ft. She landed on concrete. There was no blood loss. The point of impact was the right knee. The pain is present in the right knee (Right hand and right 5th toe). The pain is at a severity of 10/10. The pain is severe. She was ambulatory at the scene. Pertinent negatives include no numbness, no nausea, no loss of consciousness and no tingling. Associated symptoms comments: She reports laceration across the palm of her right hand.. She has tried nothing for the symptoms.    Past Medical History  Diagnosis Date  . Hypertension   . Anxiety     per patient/family  . Depression     per patient/family    Past Surgical History  Procedure Date  . Foot surgery     left  . Tonsillectomy   . Adenoidectomy   . Irrigation and debridement abscess 01/08/2012    Procedure: IRRIGATION AND DEBRIDEMENT ABSCESS;  Surgeon: Fabio Bering, MD;  Location: [facility];  Service: General;  Laterality: Right;    No family history on file.  History  Substance Use Topics  . Smoking status: Current Everyday Smoker -- 1.0 packs/day for 3 years    Types: Cigarettes  . Smokeless tobacco: Not on file  . Alcohol Use: No    OB History    Grav Para Term Preterm Abortions TAB SAB Ect Mult Living                  Review of Systems  Gastrointestinal: Negative for nausea.  Musculoskeletal: Positive for arthralgias. Negative for joint swelling.   Skin: Positive for wound.  Neurological: Negative for tingling, loss of consciousness, weakness and numbness.    Allergies  Review of patient's allergies indicates no known allergies.  Home Medications   Current Outpatient Rx  Name Route Sig Dispense Refill  . AMLODIPINE BESYLATE 10 MG PO TABS Oral Take 10 mg by mouth every morning.    . ENALAPRIL MALEATE 10 MG PO TABS Oral Take 10 mg by mouth 2 (two) times daily.    Marland Kitchen HYDROCODONE-ACETAMINOPHEN 5-325 MG PO TABS Oral Take 1 tablet by mouth every 4 (four) hours as needed for pain. 15 tablet 0  . IBUPROFEN 800 MG PO TABS Oral Take 1 tablet (800 mg total) by mouth 3 (three) times daily. 21 tablet 0  . LOSARTAN POTASSIUM 50 MG PO TABS Oral Take 75 mg by mouth every morning.    . NORETHINDRONE ACETATE 5 MG PO TABS Oral Take 5 mg by mouth every morning.      BP 106/69  Pulse 108  Temp(Src) 98.7 F (37.1 C) (Oral)  Resp 20  Ht 5\' 8"  (1.727 m)  Wt 350 lb (158.759 kg)  BMI 53.22 kg/m2  SpO2 100%  LMP 10/29/2011  Physical Exam  Nursing note and vitals reviewed. Constitutional: She appears well-developed and well-nourished.  HENT:  Head:  Normocephalic.  Cardiovascular: Normal rate and intact distal pulses.  Exam reveals no decreased pulses.   Pulses:      Dorsalis pedis pulses are 2+ on the right side, and 2+ on the left side.  Musculoskeletal: She exhibits tenderness. She exhibits no edema.       Right knee: She exhibits bony tenderness. She exhibits normal range of motion, no swelling, no effusion, no ecchymosis, no erythema and normal alignment.       Legs:      Right foot: She exhibits tenderness. She exhibits normal capillary refill, no crepitus and no deformity.       Feet:       ttp anterior patella.  Faint abrasion noted across mid palm of right hand,  Hemostatic.  Fingers with FROM,  Non tender to palpation over joints of hand,  No erythema or edema.  Neurological: She is alert. No sensory deficit.  Skin: Skin is warm,  dry and intact.    ED Course  Procedures (including critical care time)  Labs Reviewed - No data to display Dg Knee Complete 4 Views Right  02/11/2012  *RADIOLOGY REPORT*  Clinical Data: Right knee pain, popping, and swelling after fall.  RIGHT KNEE - COMPLETE 4+ VIEW  Comparison: None.  Findings: Minimal hypertrophic changes in the medial and patellofemoral compartments.  No evidence of significant effusion. No evidence of acute fracture or subluxation.  Bone cortex and trabecular architecture appear intact.  No focal bone lesion or bone destruction.  No abnormal periosteal reaction.  No radiopaque soft tissue foreign bodies.  IMPRESSION: Minimal degenerative change in the right knee.  No acute bony abnormalities.  Original Report Authenticated By: Marlon Pel, M.D.   Dg Hand Complete Right  02/11/2012  *RADIOLOGY REPORT*  Clinical Data: Right anterior palm laceration and pain after fall.  RIGHT HAND - COMPLETE 3+ VIEW  Comparison: None.  Findings: The right hand appears intact.  No evidence of acute fracture or subluxation.  No focal bone lesion or bone destruction. Bone cortex and trabecular architecture appear intact.  No abnormal periosteal reaction.  No radiopaque soft tissue foreign bodies. Incidental note of multiple punctate densities on the image consistent with screen artifact.  IMPRESSION: No acute bony abnormalities.  Original Report Authenticated By: Marlon Pel, M.D.   Dg Foot Complete Right  02/11/2012  *RADIOLOGY REPORT*  Clinical Data: Pain in the right fifth toe after fall.  RIGHT FOOT COMPLETE - 3+ VIEW  Comparison: 01/05/2012  Findings: Soft tissue swelling noted in the previous study has resolved.  Mild hallux valgus deformity with mild degenerative changes in the first metatarsophalangeal joint.  No acute fracture or subluxation is suggested.  No focal bone lesion or bone destruction.  Bone cortex and trabecular architecture appear intact.  No radiopaque soft tissue  foreign bodies.  IMPRESSION: No acute bony abnormalities appreciated.  Original Report Authenticated By: Marlon Pel, M.D.     1. Contusion of multiple sites       MDM  xrays reviewed prior to dc home.  Pt is ambulatory in dept.  Tetanus utd.  Abrasion clean and dry - no dressing required.  Ibuprofen,  Hydrocodone.  Ice,  Recheck by pcp if not improving over the next week.        Candis Musa, PA 02/13/12 1139

## 2012-02-14 NOTE — ED Provider Notes (Signed)
Medical screening examination/treatment/procedure(s) were performed by non-physician practitioner and as supervising physician I was immediately available for consultation/collaboration.   Amaziah Ghosh L Tanice Petre, MD 02/14/12 0455 

## 2012-04-21 ENCOUNTER — Emergency Department (HOSPITAL_COMMUNITY)
Admission: EM | Admit: 2012-04-21 | Discharge: 2012-04-21 | Disposition: A | Discharge status: Home / Self Care | Payer: Medicaid Other | Attending: Emergency Medicine | Admitting: Emergency Medicine

## 2012-04-21 ENCOUNTER — Encounter (HOSPITAL_COMMUNITY): Payer: Self-pay | Admitting: *Deleted

## 2012-04-21 DIAGNOSIS — N390 Urinary tract infection, site not specified: Secondary | ICD-10-CM | POA: Insufficient documentation

## 2012-04-21 DIAGNOSIS — I10 Essential (primary) hypertension: Secondary | ICD-10-CM | POA: Insufficient documentation

## 2012-04-21 DIAGNOSIS — F17200 Nicotine dependence, unspecified, uncomplicated: Secondary | ICD-10-CM | POA: Insufficient documentation

## 2012-04-21 DIAGNOSIS — F341 Dysthymic disorder: Secondary | ICD-10-CM | POA: Insufficient documentation

## 2012-04-21 LAB — URINALYSIS, ROUTINE W REFLEX MICROSCOPIC
Bilirubin Urine: NEGATIVE
Nitrite: NEGATIVE
Specific Gravity, Urine: 1.02 (ref 1.005–1.030)
Urobilinogen, UA: 0.2 mg/dL (ref 0.0–1.0)

## 2012-04-21 LAB — URINE MICROSCOPIC-ADD ON

## 2012-04-21 MED ORDER — HYDROCORTISONE 1 % EX CREA: TOPICAL_CREAM | CUTANEOUS | Status: DC

## 2012-04-21 MED ORDER — CEPHALEXIN 500 MG PO CAPS: 500.0000 mg | ORAL_CAPSULE | Freq: Four times a day (QID) | ORAL | Status: AC

## 2012-04-21 NOTE — Discharge Instructions (Signed)
Follow up with dr. Ferguson if not improving °

## 2012-04-21 NOTE — ED Notes (Signed)
Pt c/o vaginal itching, brown vaginal discharge and odor, and a rash x 3-4 days. Pt denies unprotected sex.

## 2012-04-21 NOTE — ED Provider Notes (Signed)
History   This chart was scribed for Hannah Lennert, MD by Charolett Bumpers . The patient was seen in room [facility].   CSN: [id number]  Arrival date & time 04/21/12  7829   First MD Initiated Contact with Patient 04/21/12 1949      Chief Complaint  Patient presents with  . Vaginal Itching  . Vaginal Discharge  . Abdominal Pain  . Rash    (Consider location/radiation/quality/duration/timing/severity/associated sxs/prior treatment) HPI Comments: Hannah Ryan is a 21 y.o. female who presents to the Emergency Department complaining of constant, moderate vaginal itching and burning for the past 4 days. Patient reports an associated rash, abdominal pain, brown vaginal discharge and odor. Patient denies any h/o unprotected sex.   Patient is a 21 y.o. female presenting with vaginal itching. The history is provided by the patient.  Vaginal Itching This is a new problem. The current episode started more than 2 days ago. The problem occurs constantly. The problem has been gradually worsening. Associated symptoms include abdominal pain. Nothing aggravates the symptoms. Nothing relieves the symptoms. She has tried nothing for the symptoms.    Past Medical History  Diagnosis Date  . Hypertension   . Anxiety     per patient/family  . Depression     per patient/family    Past Surgical History  Procedure Date  . Foot surgery     left  . Tonsillectomy   . Adenoidectomy   . Irrigation and debridement abscess 01/08/2012    Procedure: IRRIGATION AND DEBRIDEMENT ABSCESS;  Surgeon: Fabio Bering, MD;  Location: [facility];  Service: General;  Laterality: Right;    No family history on file.  History  Substance Use Topics  . Smoking status: Current Everyday Smoker -- 1.0 packs/day for 3 years    Types: Cigarettes  . Smokeless tobacco: Not on file  . Alcohol Use: No    OB History    Grav Para Term Preterm Abortions TAB SAB Ect Mult Living                  Review of  Systems  Constitutional: Negative for fever.  Gastrointestinal: Positive for abdominal pain.  Genitourinary: Positive for vaginal discharge.  Skin: Positive for rash.  All other systems reviewed and are negative.    Allergies  Review of patient's allergies indicates no known allergies.  Home Medications   Current Outpatient Rx  Name Route Sig Dispense Refill  . AMLODIPINE BESYLATE 10 MG PO TABS Oral Take 10 mg by mouth every morning.    . ENALAPRIL MALEATE 10 MG PO TABS Oral Take 10 mg by mouth 2 (two) times daily.    Marland Kitchen LOSARTAN POTASSIUM 50 MG PO TABS Oral Take 75 mg by mouth every morning.    . NORETHINDRONE ACETATE 5 MG PO TABS Oral Take 5 mg by mouth every morning.      BP 147/80  Pulse 71  Temp 98.6 F (37 C)  Resp 20  Ht 5\' 8"  (1.727 m)  Wt 310 lb (140.615 kg)  BMI 47.14 kg/m2  SpO2 100%  LMP 03/24/2012  Physical Exam  Constitutional: She is oriented to person, place, and time. She appears well-developed.       Obese.   HENT:  Head: Normocephalic and atraumatic.  Eyes: Conjunctivae and EOM are normal. No scleral icterus.  Neck: Neck supple. No thyromegaly present.  Cardiovascular: Normal rate and regular rhythm.  Exam reveals no gallop and no friction rub.   No  murmur heard. Pulmonary/Chest: No stridor. She has no wheezes. She has no rales. She exhibits no tenderness.  Abdominal: She exhibits no distension. There is no tenderness. There is no rebound.  Genitourinary: Vaginal discharge found.       Mild yeast white discharge.   Musculoskeletal: Normal range of motion. She exhibits no edema.  Lymphadenopathy:    She has no cervical adenopathy.  Neurological: She is oriented to person, place, and time. Coordination normal.  Skin: No rash noted. No erythema.  Psychiatric: She has a normal mood and affect. Her behavior is normal.    ED Course  Procedures (including critical care time)  DIAGNOSTIC STUDIES: Oxygen Saturation is 100% on room air, normal by my  interpretation.    COORDINATION OF CARE:  2005: Preformed pelvic exam with chaperon. Discussed planned course of treatment with the patient, who is agreeable at this time.  2127: Recheck: Informed patient of lab results and planned d/c. Will prescribe patient abx and a anti-itch cream. Patient was agreeable.    Labs Reviewed  URINALYSIS, ROUTINE W REFLEX MICROSCOPIC - Abnormal; Notable for the following:    APPearance HAZY (*)     Hgb urine dipstick LARGE (*)     All other components within normal limits  URINE MICROSCOPIC-ADD ON - Abnormal; Notable for the following:    Squamous Epithelial / LPF FEW (*)     Bacteria, UA FEW (*)     All other components within normal limits  PREGNANCY, URINE   No results found.   No diagnosis found.    MDM    The chart was scribed for me under my direct supervision.  I personally performed the history, physical, and medical decision making and all procedures in the evaluation of this patient.Hannah Lennert, MD 04/21/12 2128

## 2012-08-14 ENCOUNTER — Encounter (HOSPITAL_COMMUNITY): Payer: Self-pay | Admitting: *Deleted

## 2012-08-14 ENCOUNTER — Emergency Department (HOSPITAL_COMMUNITY)
Admission: EM | Admit: 2012-08-14 | Discharge: 2012-08-14 | Disposition: A | Discharge status: Home / Self Care | Payer: Self-pay | Attending: Emergency Medicine | Admitting: Emergency Medicine

## 2012-08-14 DIAGNOSIS — L0291 Cutaneous abscess, unspecified: Secondary | ICD-10-CM

## 2012-08-14 DIAGNOSIS — Z8659 Personal history of other mental and behavioral disorders: Secondary | ICD-10-CM | POA: Insufficient documentation

## 2012-08-14 DIAGNOSIS — L02219 Cutaneous abscess of trunk, unspecified: Secondary | ICD-10-CM | POA: Insufficient documentation

## 2012-08-14 DIAGNOSIS — F17200 Nicotine dependence, unspecified, uncomplicated: Secondary | ICD-10-CM | POA: Insufficient documentation

## 2012-08-14 DIAGNOSIS — Z792 Long term (current) use of antibiotics: Secondary | ICD-10-CM | POA: Insufficient documentation

## 2012-08-14 DIAGNOSIS — I10 Essential (primary) hypertension: Secondary | ICD-10-CM | POA: Insufficient documentation

## 2012-08-14 DIAGNOSIS — Z79899 Other long term (current) drug therapy: Secondary | ICD-10-CM | POA: Insufficient documentation

## 2012-08-14 MED ORDER — OXYCODONE-ACETAMINOPHEN 5-325 MG PO TABS
1.0000 | ORAL_TABLET | Freq: Once | ORAL | Status: AC
  Administered 2012-08-14: 1 via ORAL
  Filled 2012-08-14: qty 1

## 2012-08-14 MED ORDER — SULFAMETHOXAZOLE-TRIMETHOPRIM 800-160 MG PO TABS: 1.0000 | ORAL_TABLET | Freq: Two times a day (BID) | ORAL | Status: DC

## 2012-08-14 MED ORDER — OXYCODONE-ACETAMINOPHEN 5-325 MG PO TABS: 1.0000 | ORAL_TABLET | ORAL | Status: AC | PRN

## 2012-08-14 MED ORDER — SULFAMETHOXAZOLE-TMP DS 800-160 MG PO TABS
1.0000 | ORAL_TABLET | Freq: Once | ORAL | Status: AC
  Administered 2012-08-14: 1 via ORAL
  Filled 2012-08-14: qty 1

## 2012-08-14 MED ORDER — LIDOCAINE HCL (PF) 1 % IJ SOLN
5.0000 mL | Freq: Once | INTRAMUSCULAR | Status: AC
  Administered 2012-08-14: 5 mL via INTRADERMAL
  Filled 2012-08-14: qty 5

## 2012-08-14 NOTE — ED Notes (Signed)
Abscess pubic region, noticed today

## 2012-08-14 NOTE — ED Notes (Signed)
Pt presents with abscess in suprapubic area, noted 2-3 days ago. Pt states has a yellow, brownish drainage, with foul odor. Pt states has had "boils before". Pt denies fever. NAD noted.

## 2012-08-15 NOTE — ED Provider Notes (Signed)
History     CSN: [id number]  Arrival date & time 08/14/12  4098   First MD Initiated Contact with Patient 08/14/12 1853      Chief Complaint  Patient presents with  . Abscess    (Consider location/radiation/quality/duration/timing/severity/associated sxs/prior treatment) Patient is a 21 y.o. female presenting with abscess. The history is provided by the patient.  Abscess  This is a new problem. The current episode started yesterday. The onset was gradual. The problem occurs continuously. The problem has been gradually worsening. Affected Location: pubic region. The problem is moderate. The abscess is characterized by redness, painfulness and swelling. Pertinent negatives include no decrease in physical activity, no fever, no diarrhea and no vomiting. Her past medical history is significant for skin abscesses in family. There were no sick contacts. She has received no recent medical care.    Past Medical History  Diagnosis Date  . Hypertension   . Anxiety     per patient/family  . Depression     per patient/family    Past Surgical History  Procedure Date  . Foot surgery     left  . Tonsillectomy   . Adenoidectomy   . Irrigation and debridement abscess 01/08/2012    Procedure: IRRIGATION AND DEBRIDEMENT ABSCESS;  Surgeon: Fabio Bering, MD;  Location: [facility];  Service: General;  Laterality: Right;    History reviewed. No pertinent family history.  History  Substance Use Topics  . Smoking status: Current Every Day Smoker -- 1.0 packs/day for 3 years    Types: Cigarettes  . Smokeless tobacco: Not on file  . Alcohol Use: No    OB History    Grav Para Term Preterm Abortions TAB SAB Ect Mult Living                  Review of Systems  Constitutional: Negative for fever and chills.  Gastrointestinal: Negative for nausea, vomiting and diarrhea.  Musculoskeletal: Negative for joint swelling and arthralgias.  Skin: Positive for color change.       Abscess     Hematological: Negative for adenopathy.  All other systems reviewed and are negative.    Allergies  Review of patient's allergies indicates no known allergies.  Home Medications   Current Outpatient Rx  Name Route Sig Dispense Refill  . AMLODIPINE BESYLATE 10 MG PO TABS Oral Take 10 mg by mouth every morning.    . ENALAPRIL MALEATE 10 MG PO TABS Oral Take 10 mg by mouth 2 (two) times daily.    Marland Kitchen LOSARTAN POTASSIUM 50 MG PO TABS Oral Take 75 mg by mouth every morning.    . NORETHINDRONE ACETATE 5 MG PO TABS Oral Take 5 mg by mouth every morning.    . OXYCODONE-ACETAMINOPHEN 5-325 MG PO TABS Oral Take 1 tablet by mouth every 4 (four) hours as needed for pain. 6 tablet 0  . OXYCODONE-ACETAMINOPHEN 5-325 MG PO TABS Oral Take 1 tablet by mouth every 4 (four) hours as needed for pain. 15 tablet 0  . SULFAMETHOXAZOLE-TRIMETHOPRIM 800-160 MG PO TABS Oral Take 1 tablet by mouth 2 (two) times daily. For 10 days 20 tablet 0    BP 152/82  Pulse 105  Temp 98.4 F (36.9 C) (Oral)  Resp 20  Ht 5\' 8"  (1.727 m)  Wt 305 lb (138.347 kg)  BMI 46.38 kg/m2  SpO2 100%  LMP 08/02/2012  Physical Exam  Nursing note and vitals reviewed. Constitutional: She is oriented to person, place, and time. She appears  well-developed and well-nourished. No distress.       Morbidly obese  HENT:  Head: Normocephalic and atraumatic.  Cardiovascular: Normal rate, regular rhythm and normal heart sounds.   Pulmonary/Chest: Effort normal and breath sounds normal.  Neurological: She is alert and oriented to person, place, and time. She exhibits normal muscle tone. Coordination normal.  Skin: Skin is warm. There is erythema.          Abscess to the pubic region.  Mild to moderate induration and surrounding erythema also present.  No drainage    ED Course  Procedures (including critical care time)  Labs Reviewed - No data to display No results found.   1. Abscess       MDM    INCISION AND  DRAINAGE Performed by: Pauline Aus L. Consent: Verbal consent obtained. Risks and benefits: risks, benefits and alternatives were discussed Type: abscess  Body area:pubic region  Anesthesia: local infiltration  Local anesthetic: lidocaine1 % w/o epinephrine  Anesthetic total: 3  ml  Complexity: complex Blunt dissection to break up loculations  Drainage: purulent  Drainage amount: moderate  Packing material: 1/4 in iodoform gauze  Patient tolerance: Patient tolerated the procedure well with no immediate complications.      Agrees to warm water soaks, return here in 2 days for receck   Prescribed: Bactrim DS Percocet #15   Reagan Behlke L. [place], [place] 08/15/12 1228

## 2012-08-15 NOTE — ED Provider Notes (Signed)
Medical screening examination/treatment/procedure(s) were performed by non-physician practitioner and as supervising physician I was immediately available for consultation/collaboration. Blayze Haen, MD, FACEP   Caitriona Sundquist L Tysheem Accardo, MD 08/15/12 2026 

## 2012-08-16 ENCOUNTER — Encounter (HOSPITAL_COMMUNITY): Payer: Self-pay

## 2012-08-16 ENCOUNTER — Emergency Department (HOSPITAL_COMMUNITY)
Admission: EM | Admit: 2012-08-16 | Discharge: 2012-08-16 | Disposition: A | Discharge status: Home / Self Care | Payer: Self-pay | Attending: Emergency Medicine | Admitting: Emergency Medicine

## 2012-08-16 DIAGNOSIS — I10 Essential (primary) hypertension: Secondary | ICD-10-CM | POA: Insufficient documentation

## 2012-08-16 DIAGNOSIS — Z79899 Other long term (current) drug therapy: Secondary | ICD-10-CM | POA: Insufficient documentation

## 2012-08-16 DIAGNOSIS — F329 Major depressive disorder, single episode, unspecified: Secondary | ICD-10-CM | POA: Insufficient documentation

## 2012-08-16 DIAGNOSIS — L0291 Cutaneous abscess, unspecified: Secondary | ICD-10-CM | POA: Insufficient documentation

## 2012-08-16 DIAGNOSIS — L0390 Cellulitis, unspecified: Secondary | ICD-10-CM | POA: Insufficient documentation

## 2012-08-16 DIAGNOSIS — F411 Generalized anxiety disorder: Secondary | ICD-10-CM | POA: Insufficient documentation

## 2012-08-16 DIAGNOSIS — F3289 Other specified depressive episodes: Secondary | ICD-10-CM | POA: Insufficient documentation

## 2012-08-16 DIAGNOSIS — F17200 Nicotine dependence, unspecified, uncomplicated: Secondary | ICD-10-CM | POA: Insufficient documentation

## 2012-08-16 MED ORDER — OXYCODONE-ACETAMINOPHEN 5-325 MG PO TABS
1.0000 | ORAL_TABLET | Freq: Once | ORAL | Status: AC
  Administered 2012-08-16: 1 via ORAL
  Filled 2012-08-16: qty 1

## 2012-08-16 MED ORDER — NAPROXEN 500 MG PO TABS: 500.0000 mg | ORAL_TABLET | Freq: Two times a day (BID) | ORAL | Status: DC

## 2012-08-16 NOTE — ED Provider Notes (Signed)
History     CSN: [id number]  Arrival date & time 08/16/12  1533   First MD Initiated Contact with Patient 08/16/12 1615      Chief Complaint  Patient presents with  . Abscess    (Consider location/radiation/quality/duration/timing/severity/associated sxs/prior treatment) Patient is a 21 y.o. female presenting with wound check. The history is provided by the patient.  Wound Check  She was treated in the ED 2 to 3 days ago. Previous treatment in the ED includes I&D of abscess and oral antibiotics. Treatments since wound repair include oral antibiotics. Fever duration: no fever. There has been colored discharge from the wound. The redness has improved. The swelling has improved. The pain has not changed. She has no difficulty moving the affected extremity or digit.    Past Medical History  Diagnosis Date  . Hypertension   . Anxiety     per patient/family  . Depression     per patient/family    Past Surgical History  Procedure Date  . Foot surgery     left  . Tonsillectomy   . Adenoidectomy   . Irrigation and debridement abscess 01/08/2012    Procedure: IRRIGATION AND DEBRIDEMENT ABSCESS;  Surgeon: Fabio Bering, MD;  Location: [facility];  Service: General;  Laterality: Right;    No family history on file.  History  Substance Use Topics  . Smoking status: Current Every Day Smoker -- 1.0 packs/day for 3 years    Types: Cigarettes  . Smokeless tobacco: Not on file  . Alcohol Use: No    OB History    Grav Para Term Preterm Abortions TAB SAB Ect Mult Living                  Review of Systems  Constitutional: Negative for fever and chills.  Gastrointestinal: Negative for nausea and vomiting.  Musculoskeletal: Negative for joint swelling and arthralgias.  Skin: Positive for color change.       Abscess   Hematological: Negative for adenopathy.  All other systems reviewed and are negative.    Allergies  Review of patient's allergies indicates no known  allergies.  Home Medications   Current Outpatient Rx  Name Route Sig Dispense Refill  . AMLODIPINE BESYLATE 10 MG PO TABS Oral Take 10 mg by mouth every morning.    . ENALAPRIL MALEATE 10 MG PO TABS Oral Take 10 mg by mouth 2 (two) times daily.    Marland Kitchen LOSARTAN POTASSIUM 50 MG PO TABS Oral Take 75 mg by mouth every morning.    . NORETHINDRONE ACETATE 5 MG PO TABS Oral Take 5 mg by mouth every morning.    . OXYCODONE-ACETAMINOPHEN 5-325 MG PO TABS Oral Take 1 tablet by mouth every 4 (four) hours as needed for pain. 6 tablet 0  . OXYCODONE-ACETAMINOPHEN 5-325 MG PO TABS Oral Take 1 tablet by mouth every 4 (four) hours as needed for pain. 15 tablet 0  . SULFAMETHOXAZOLE-TRIMETHOPRIM 800-160 MG PO TABS Oral Take 1 tablet by mouth 2 (two) times daily. For 10 days 20 tablet 0    BP 152/104  Pulse 95  Temp 97.8 F (36.6 C) (Oral)  Resp 18  Ht 5\' 8"  (1.727 m)  Wt 305 lb (138.347 kg)  BMI 46.38 kg/m2  SpO2 100%  LMP 08/02/2012  Physical Exam  Nursing note and vitals reviewed. Constitutional: She is oriented to person, place, and time. She appears well-developed and well-nourished. No distress.  HENT:  Head: Normocephalic and atraumatic.  Cardiovascular: Normal  rate, regular rhythm and normal heart sounds.   Pulmonary/Chest: Effort normal and breath sounds normal.  Abdominal: Soft. She exhibits no distension. There is no tenderness.  Neurological: She is alert and oriented to person, place, and time. She exhibits normal muscle tone. Coordination normal.  Skin: Skin is warm. There is erythema.       Abscess to the suprapubic area with previous I&D.  Erythema improved.  Abscess appears to be healing.     ED Course  Procedures (including critical care time)  Labs Reviewed - No data to display No results found.  Packing to the abscess was removed by me w/o difficulty.  Area was re-bandaged.      MDM    Patient seen here by me 2 days ago for I&D.  Abscess appears to be improving.  Previous cellulitis improved.   Patient is currently taking bactrim.     Pt agrees to warm water soaks. Continue bactrim      Hannah Aro L. [place], PA 08/16/12 1638  Hannah Ryan, [place] 08/16/12 1638

## 2012-08-16 NOTE — ED Provider Notes (Signed)
Medical screening examination/treatment/procedure(s) were performed by non-physician practitioner and as supervising physician I was immediately available for consultation/collaboration.   Benny Lennert, MD 08/16/12 [phone]

## 2012-08-16 NOTE — ED Notes (Signed)
Pt here for recheck of abscess.  

## 2012-08-19 MED FILL — Oxycodone w/ Acetaminophen Tab 5-325 MG: ORAL | Qty: 6 | Status: AC

## 2012-09-05 ENCOUNTER — Encounter (HOSPITAL_COMMUNITY): Payer: Self-pay

## 2012-09-05 ENCOUNTER — Emergency Department (HOSPITAL_COMMUNITY)
Admission: EM | Admit: 2012-09-05 | Discharge: 2012-09-05 | Disposition: A | Discharge status: Home / Self Care | Payer: Self-pay | Attending: Emergency Medicine | Admitting: Emergency Medicine

## 2012-09-05 DIAGNOSIS — F329 Major depressive disorder, single episode, unspecified: Secondary | ICD-10-CM | POA: Insufficient documentation

## 2012-09-05 DIAGNOSIS — L600 Ingrowing nail: Secondary | ICD-10-CM | POA: Insufficient documentation

## 2012-09-05 DIAGNOSIS — F3289 Other specified depressive episodes: Secondary | ICD-10-CM | POA: Insufficient documentation

## 2012-09-05 DIAGNOSIS — F17200 Nicotine dependence, unspecified, uncomplicated: Secondary | ICD-10-CM | POA: Insufficient documentation

## 2012-09-05 DIAGNOSIS — F411 Generalized anxiety disorder: Secondary | ICD-10-CM | POA: Insufficient documentation

## 2012-09-05 DIAGNOSIS — I10 Essential (primary) hypertension: Secondary | ICD-10-CM | POA: Insufficient documentation

## 2012-09-05 LAB — GLUCOSE, CAPILLARY: Glucose-Capillary: 114 mg/dL — ABNORMAL HIGH (ref 70–99)

## 2012-09-05 MED ORDER — HYDROCODONE-ACETAMINOPHEN 5-325 MG PO TABS
1.0000 | ORAL_TABLET | Freq: Once | ORAL | Status: AC
  Administered 2012-09-05: 1 via ORAL
  Filled 2012-09-05: qty 1

## 2012-09-05 MED ORDER — HYDROCODONE-ACETAMINOPHEN 5-325 MG PO TABS: 1.0000 | ORAL_TABLET | Freq: Four times a day (QID) | ORAL | Status: AC | PRN

## 2012-09-05 MED ORDER — CEPHALEXIN 500 MG PO CAPS
500.0000 mg | ORAL_CAPSULE | Freq: Once | ORAL | Status: AC
Start: 2012-09-05 — End: 2012-09-05
  Administered 2012-09-05: 500 mg via ORAL
  Filled 2012-09-05: qty 1

## 2012-09-05 MED ORDER — CEPHALEXIN 500 MG PO CAPS: 500.0000 mg | ORAL_CAPSULE | Freq: Four times a day (QID) | ORAL | Status: DC

## 2012-09-05 NOTE — ED Provider Notes (Signed)
History     CSN: [id number]  Arrival date & time 09/05/12  1304   None     Chief Complaint  Patient presents with  . Toe Pain    (Consider location/radiation/quality/duration/timing/severity/associated sxs/prior treatment) HPI Comments: Pt has ingrown toenails to B great toes.  She has been digging at th cuticles with utensil and has noted green drainage from the L toe.  No fever or chills.    Patient is a 21 y.o. female presenting with toe pain. The history is provided by the patient. No language interpreter was used.  Toe Pain This is a new problem. Episode onset: 1 week ago. The problem occurs constantly. Pertinent negatives include no chills or fever. The symptoms are aggravated by walking and standing (palpation). She has tried nothing for the symptoms. The treatment provided no relief.    Past Medical History  Diagnosis Date  . Hypertension   . Anxiety     per patient/family  . Depression     per patient/family    Past Surgical History  Procedure Date  . Foot surgery     left  . Tonsillectomy   . Adenoidectomy   . Irrigation and debridement abscess 01/08/2012    Procedure: IRRIGATION AND DEBRIDEMENT ABSCESS;  Surgeon: Fabio Bering, MD;  Location: [facility];  Service: General;  Laterality: Right;    No family history on file.  History  Substance Use Topics  . Smoking status: Current Every Day Smoker -- 1.0 packs/day for 3 years    Types: Cigarettes  . Smokeless tobacco: Not on file  . Alcohol Use: No    OB History    Grav Para Term Preterm Abortions TAB SAB Ect Mult Living                  Review of Systems  Constitutional: Negative for fever and chills.  Skin: Negative for wound.  All other systems reviewed and are negative.    Allergies  Review of patient's allergies indicates no known allergies.  Home Medications   Current Outpatient Rx  Name  Route  Sig  Dispense  Refill  . CEPHALEXIN 500 MG PO CAPS   Oral   Take 1 capsule (500 mg  total) by mouth 4 (four) times daily.   28 capsule   0   . HYDROCODONE-ACETAMINOPHEN 5-325 MG PO TABS   Oral   Take 1 tablet by mouth every 6 (six) hours as needed for pain.   20 tablet   0     BP 133/99  Pulse 105  Temp 97.8 F (36.6 C) (Oral)  Resp 20  Ht 5\' 8"  (1.727 m)  Wt 393 lb 4 oz (178.377 kg)  BMI 59.79 kg/m2  SpO2 99%  LMP 09/02/2012  Physical Exam  Nursing note and vitals reviewed. Constitutional: She is oriented to person, place, and time. She appears well-developed and well-nourished. No distress.  HENT:  Head: Normocephalic and atraumatic.  Eyes: EOM are normal.  Neck: Normal range of motion.  Cardiovascular: Normal rate and regular rhythm.   Pulmonary/Chest: Effort normal.  Abdominal: Soft. She exhibits no distension. There is no tenderness.  Musculoskeletal: Normal range of motion. She exhibits tenderness.       Left foot: She exhibits tenderness. She exhibits normal range of motion, no bony tenderness, no swelling, normal capillary refill, no crepitus, no deformity and no laceration.       Feet:  Neurological: She is alert and oriented to person, place, and time.  Skin: Skin is warm and dry.  Psychiatric: She has a normal mood and affect. Judgment normal.    ED Course  Procedures (including critical care time)  Labs Reviewed  GLUCOSE, CAPILLARY - Abnormal; Notable for the following:    Glucose-Capillary 114 (*)     All other components within normal limits   No results found.   1. Ingrown toenail       MDM  rx-keflex 500 mg QID x 7 days. rx-hydrocodone, 20 Warm soaks F/u at [facility] as planned.        Evalina Field, [place] 09/05/12 [phone]

## 2012-09-05 NOTE — ED Notes (Signed)
Complain of infected left great toe

## 2012-09-05 NOTE — ED Provider Notes (Signed)
Medical screening examination/treatment/procedure(s) were performed by non-physician practitioner and as supervising physician I was immediately available for consultation/collaboration.   Charod Slawinski III, MD 09/05/12 1838 

## 2013-05-15 ENCOUNTER — Emergency Department (HOSPITAL_COMMUNITY)
Admission: EM | Admit: 2013-05-15 | Discharge: 2013-05-15 | Disposition: A | Discharge status: Home / Self Care | Payer: Self-pay | Attending: Emergency Medicine | Admitting: Emergency Medicine

## 2013-05-15 ENCOUNTER — Encounter (HOSPITAL_COMMUNITY): Payer: Self-pay | Admitting: *Deleted

## 2013-05-15 DIAGNOSIS — F17200 Nicotine dependence, unspecified, uncomplicated: Secondary | ICD-10-CM | POA: Insufficient documentation

## 2013-05-15 DIAGNOSIS — K611 Rectal abscess: Secondary | ICD-10-CM

## 2013-05-15 DIAGNOSIS — K612 Anorectal abscess: Secondary | ICD-10-CM | POA: Insufficient documentation

## 2013-05-15 DIAGNOSIS — I10 Essential (primary) hypertension: Secondary | ICD-10-CM | POA: Insufficient documentation

## 2013-05-15 DIAGNOSIS — Z8659 Personal history of other mental and behavioral disorders: Secondary | ICD-10-CM | POA: Insufficient documentation

## 2013-05-15 MED ORDER — LIDOCAINE HCL (PF) 1 % IJ SOLN
INTRAMUSCULAR | Status: AC
  Administered 2013-05-15: 10 mL
  Filled 2013-05-15: qty 10

## 2013-05-15 MED ORDER — HYDROCODONE-ACETAMINOPHEN 5-325 MG PO TABS: 1.0000 | ORAL_TABLET | ORAL | Status: DC | PRN

## 2013-05-15 NOTE — ED Notes (Signed)
Pt stable education done. Discharged to home.

## 2013-05-15 NOTE — ED Provider Notes (Signed)
History    CSN: [id number] Arrival date & time 05/15/13  4098  First MD Initiated Contact with Patient 05/15/13 [phone]     Chief Complaint  Patient presents with  . Abscess   (Consider location/radiation/quality/duration/timing/severity/associated sxs/prior Treatment) Patient is a 22 y.o. female presenting with abscess. The history is provided by the patient.  Abscess Location:  Ano-genital Ano-genital abscess location:  Perineum Size:  2 cm Abscess quality: draining, induration and painful   Abscess quality: no redness   Red streaking: no   Duration:  4 days Progression:  Worsening Pain details:    Quality:  Aching and shooting   Severity:  Severe   Duration:  2 days   Timing:  Constant   Progression:  Worsening Chronicity:  New Context: not diabetes and not skin injury   Relieved by:  Nothing Exacerbated by: palpation. Ineffective treatments:  Warm compresses Associated symptoms: no fever, no headaches, no nausea and no vomiting    Past Medical History  Diagnosis Date  . Hypertension   . Anxiety     per patient/family  . Depression     per patient/family   Past Surgical History  Procedure Laterality Date  . Foot surgery      left  . Tonsillectomy    . Adenoidectomy    . Irrigation and debridement abscess  01/08/2012    Procedure: IRRIGATION AND DEBRIDEMENT ABSCESS;  Surgeon: Fabio Bering, MD;  Location: [facility];  Service: General;  Laterality: Right;   No family history on file. History  Substance Use Topics  . Smoking status: Current Every Day Smoker -- 1.00 packs/day for 3 years    Types: Cigarettes  . Smokeless tobacco: Not on file  . Alcohol Use: No   OB History   Grav Para Term Preterm Abortions TAB SAB Ect Mult Living                 Review of Systems  Constitutional: Negative for fever and chills.  HENT: Negative for congestion, sore throat and neck pain.   Eyes: Negative.   Respiratory: Negative for chest tightness and shortness of  breath.   Cardiovascular: Negative for chest pain.  Gastrointestinal: Negative for nausea, vomiting and abdominal pain.  Genitourinary: Negative.   Musculoskeletal: Negative for joint swelling and arthralgias.  Skin: Negative for color change and rash.  Neurological: Negative for dizziness, weakness, light-headedness, numbness and headaches.  Psychiatric/Behavioral: Negative.     Allergies  Review of patient's allergies indicates no known allergies.  Home Medications   Current Outpatient Rx  Name  Route  Sig  Dispense  Refill  . HYDROcodone-acetaminophen (NORCO/VICODIN) 5-325 MG per tablet   Oral   Take 1 tablet by mouth every 4 (four) hours as needed for pain.   15 tablet   0    BP 159/88  Pulse 80  Temp(Src) 98.2 F (36.8 C) (Oral)  Resp 16  SpO2 99%  LMP 04/19/2013 Physical Exam  Constitutional: She appears well-developed and well-nourished.  HENT:  Head: Atraumatic.  Neck: Normal range of motion.  Cardiovascular:  Pulses equal bilaterally  Musculoskeletal: She exhibits no tenderness.  Neurological: She is alert. She has normal strength. She displays normal reflexes. No sensory deficit.  Equal strength  Skin: Skin is warm and dry.  2 cm induration right medial buttock near anus, no rectal pain.  Small central opening with no active drainage, no fluctuance.  Psychiatric: She has a normal mood and affect.    ED Course  Procedures (including critical care time)   INCISION AND DRAINAGE Performed by: Burgess Amor Consent: Verbal consent obtained. Risks and benefits: risks, benefits and alternatives were discussed Type: abscess  Body area: right buttock/perianal  Anesthesia: local infiltration  Incision was made with a scalpel.  Local anesthetic: lidocaine 1% without epinephrine  Anesthetic total: 2 ml injected,  Then remaining 8 ml used for cavity flush  Complexity: complex Blunt dissection to break up loculations  Drainage: purulent  Drainage  amount: small  Packing material: none Patient tolerance: Patient tolerated the procedure well with no immediate complications.    Labs Reviewed - No data to display No results found. 1. Perirectal abscess     MDM  Pt encouraged warm epsom salt soaks starting today.  Prescribed hydrocodone.  PRN f/u anticipated, advised to return here for any problems or progressive sx.  Burgess Amor, PA-C 05/15/13 1307

## 2013-05-15 NOTE — ED Provider Notes (Signed)
Medical screening examination/treatment/procedure(s) were performed by non-physician practitioner and as supervising physician I was immediately available for consultation/collaboration.   Denise Bramblett L Dareon Nunziato, MD 05/15/13 1450 

## 2013-05-15 NOTE — ED Notes (Signed)
Vaginal abscess x 4 days with clear drainage.  C/o pain.

## 2013-07-24 ENCOUNTER — Emergency Department (HOSPITAL_COMMUNITY)
Admission: EM | Admit: 2013-07-24 | Discharge: 2013-07-25 | Disposition: A | Discharge status: Home / Self Care | Payer: Self-pay | Attending: Emergency Medicine | Admitting: Emergency Medicine

## 2013-07-24 ENCOUNTER — Encounter (HOSPITAL_COMMUNITY): Payer: Self-pay | Admitting: Emergency Medicine

## 2013-07-24 DIAGNOSIS — E119 Type 2 diabetes mellitus without complications: Secondary | ICD-10-CM | POA: Insufficient documentation

## 2013-07-24 DIAGNOSIS — Z8659 Personal history of other mental and behavioral disorders: Secondary | ICD-10-CM | POA: Insufficient documentation

## 2013-07-24 DIAGNOSIS — J45909 Unspecified asthma, uncomplicated: Secondary | ICD-10-CM | POA: Insufficient documentation

## 2013-07-24 DIAGNOSIS — E669 Obesity, unspecified: Secondary | ICD-10-CM | POA: Insufficient documentation

## 2013-07-24 DIAGNOSIS — R079 Chest pain, unspecified: Secondary | ICD-10-CM | POA: Insufficient documentation

## 2013-07-24 DIAGNOSIS — F410 Panic disorder [episodic paroxysmal anxiety] without agoraphobia: Secondary | ICD-10-CM | POA: Insufficient documentation

## 2013-07-24 DIAGNOSIS — R0602 Shortness of breath: Secondary | ICD-10-CM | POA: Insufficient documentation

## 2013-07-24 DIAGNOSIS — F17200 Nicotine dependence, unspecified, uncomplicated: Secondary | ICD-10-CM | POA: Insufficient documentation

## 2013-07-24 DIAGNOSIS — I10 Essential (primary) hypertension: Secondary | ICD-10-CM | POA: Insufficient documentation

## 2013-07-24 HISTORY — DX: Obesity, unspecified: E66.9

## 2013-07-24 LAB — POCT I-STAT, CHEM 8
BUN: 14 mg/dL (ref 6–23)
Chloride: 107 mEq/L (ref 96–112)
Creatinine, Ser: 0.9 mg/dL (ref 0.50–1.10)
Glucose, Bld: 94 mg/dL (ref 70–99)
Potassium: 4.1 mEq/L (ref 3.5–5.1)
Sodium: 143 mEq/L (ref 135–145)

## 2013-07-24 MED ORDER — LORAZEPAM 2 MG/ML IJ SOLN
1.0000 mg | Freq: Once | INTRAMUSCULAR | Status: AC
  Administered 2013-07-24: 1 mg via INTRAMUSCULAR
  Filled 2013-07-24: qty 1

## 2013-07-24 MED ORDER — LORAZEPAM 1 MG PO TABS: 1.0000 mg | ORAL_TABLET | Freq: Four times a day (QID) | ORAL | Status: DC | PRN

## 2013-07-24 NOTE — ED Notes (Signed)
Pt. arrived with EMS [facility] area had a panic attack after learning that her family member had a stiil born baby this evening . Calm at arrival .

## 2013-07-24 NOTE — ED Provider Notes (Signed)
CSN: [id number]     Arrival date & time 07/24/13  2142 History   First MD Initiated Contact with Patient 07/24/13 2228     Chief Complaint  Patient presents with  . Panic Attack   (Consider location/radiation/quality/duration/timing/severity/associated sxs/prior Treatment) HPI  Hannah Ryan is a 22 y.o. female BIBEMS c/o panic attack with chest pain and SOB after learning that a family member had a miscarriage tonight. Pt states she has had panic attacks in the past and the episode tonight was similar. She denies any chest pain to SOB now, but states that the is upset and agitated. Denies FH of early cardiac death, h/o DVT/PE, calf pain leg swelling, recent immobilizations, N/V, SI, HI, AVH.   Past Medical History  Diagnosis Date  . Hypertension   . Anxiety     per patient/family  . Depression     per patient/family  . Obesity   . Diabetes mellitus without complication   . Asthma    Past Surgical History  Procedure Laterality Date  . Foot surgery      left  . Tonsillectomy    . Adenoidectomy    . Irrigation and debridement abscess  01/08/2012    Procedure: IRRIGATION AND DEBRIDEMENT ABSCESS;  Surgeon: Fabio Bering, MD;  Location: [facility];  Service: General;  Laterality: Right;   No family history on file. History  Substance Use Topics  . Smoking status: Current Every Day Smoker -- 1.00 packs/day for 3 years    Types: Cigarettes  . Smokeless tobacco: Not on file  . Alcohol Use: No   OB History   Grav Para Term Preterm Abortions TAB SAB Ect Mult Living                 Review of Systems 10 systems reviewed and found to be negative, except as noted in the HPI  Allergies  Review of patient's allergies indicates no known allergies.  Home Medications  No current outpatient prescriptions on file. BP 172/100  Pulse 82  Temp(Src) 98.2 F (36.8 C)  Resp 18  SpO2 98%  LMP 07/20/2013 Physical Exam  Nursing note and vitals reviewed. Constitutional: She is oriented  to person, place, and time. She appears well-developed and well-nourished.  Obese  HENT:  Head: Normocephalic and atraumatic.  Mouth/Throat: Oropharynx is clear and moist.  Eyes: Conjunctivae and EOM are normal.  Neck: Normal range of motion. No JVD present.  Cardiovascular: Normal rate, regular rhythm and intact distal pulses.   Pulmonary/Chest: Effort normal and breath sounds normal. No stridor. No respiratory distress. She has no wheezes. She has no rales. She exhibits no tenderness.  Abdominal: Soft. Bowel sounds are normal. She exhibits no distension and no mass. There is no tenderness. There is no rebound and no guarding.  Musculoskeletal: Normal range of motion. She exhibits no edema.  No calf asymmetry, superficial collaterals, palpable cords, edema, Homans sign negative bilaterally.    Neurological: She is alert and oriented to person, place, and time. She has normal reflexes.  Psychiatric: Her mood appears not anxious. Her affect is not inappropriate. Her speech is not rapid and/or pressured. She is not agitated, not aggressive, not hyperactive, not slowed, not withdrawn, not actively hallucinating and not combative. Thought content is not paranoid and not delusional. She does not express impulsivity or inappropriate judgment. She expresses no homicidal and no suicidal ideation. She is communicative.  Sad, crying occasionally She is attentive.    ED Course  Procedures (including critical care  time) Labs Review Labs Reviewed  POCT I-STAT, CHEM 8   Imaging Review No results found.   Date: 07/24/2013  Rate: 67  Rhythm: normal sinus rhythm  QRS Axis: normal  Intervals: normal  ST/T Wave abnormalities: normal  Conduction Disutrbances:none  Narrative Interpretation:   Old EKG Reviewed: unchanged   MDM   1. Panic attack as reaction to stress     Filed Vitals:   07/24/13 2146  BP: 172/100  Pulse: 82  Temp: 98.2 F (36.8 C)  Resp: 18  SpO2: 98%     Hannah C  Ryan is a 22 y.o. female with CP and SOB (now resolved) consistent with prior panic attacks after family member had a miscarriage. Appears to be resolved. ECG is non ischemic Blood work unremarkable.   Medications  LORazepam (ATIVAN) injection 1 mg (1 mg Intramuscular Given 07/24/13 2327)    Pt is hemodynamically stable, appropriate for, and amenable to discharge at this time. Pt verbalized understanding and agrees with care plan. All questions answered. Outpatient follow-up and specific return precautions discussed.    Discharge Medication List as of 07/24/2013 11:54 PM    START taking these medications   Details  LORazepam (ATIVAN) 1 MG tablet Take 1 tablet (1 mg total) by mouth every 6 (six) hours as needed for anxiety., Starting 07/24/2013, Until Discontinued, Print        Note: Portions of this report may have been transcribed using voice recognition software. Every effort was made to ensure accuracy; however, inadvertent computerized transcription errors may be present      Wynetta Emery, PA-C 07/25/13 0105

## 2013-07-25 NOTE — ED Notes (Signed)
1 mg Ativan witnessed waste by Lenora Boys RN

## 2013-07-25 NOTE — ED Provider Notes (Signed)
Medical screening examination/treatment/procedure(s) were performed by non-physician practitioner and as supervising physician I was immediately available for consultation/collaboration.  Kadra Kohan M Otoniel Myhand, MD 07/25/13 0527 

## 2013-07-25 NOTE — ED Notes (Cosign Needed)
1mg  waste ativan. Witnessed by Margit Banda RN

## 2015-01-07 ENCOUNTER — Encounter (HOSPITAL_COMMUNITY): Payer: Self-pay | Admitting: *Deleted

## 2015-01-07 ENCOUNTER — Emergency Department (HOSPITAL_COMMUNITY)
Admission: EM | Admit: 2015-01-07 | Discharge: 2015-01-07 | Disposition: A | Discharge status: Home / Self Care | Payer: Self-pay | Attending: Emergency Medicine | Admitting: Emergency Medicine

## 2015-01-07 DIAGNOSIS — R112 Nausea with vomiting, unspecified: Secondary | ICD-10-CM | POA: Insufficient documentation

## 2015-01-07 DIAGNOSIS — M5431 Sciatica, right side: Secondary | ICD-10-CM | POA: Insufficient documentation

## 2015-01-07 DIAGNOSIS — Z720 Tobacco use: Secondary | ICD-10-CM | POA: Insufficient documentation

## 2015-01-07 DIAGNOSIS — E119 Type 2 diabetes mellitus without complications: Secondary | ICD-10-CM | POA: Insufficient documentation

## 2015-01-07 DIAGNOSIS — I10 Essential (primary) hypertension: Secondary | ICD-10-CM | POA: Insufficient documentation

## 2015-01-07 DIAGNOSIS — J45909 Unspecified asthma, uncomplicated: Secondary | ICD-10-CM | POA: Insufficient documentation

## 2015-01-07 DIAGNOSIS — Z3202 Encounter for pregnancy test, result negative: Secondary | ICD-10-CM | POA: Insufficient documentation

## 2015-01-07 DIAGNOSIS — E669 Obesity, unspecified: Secondary | ICD-10-CM | POA: Insufficient documentation

## 2015-01-07 DIAGNOSIS — F329 Major depressive disorder, single episode, unspecified: Secondary | ICD-10-CM | POA: Insufficient documentation

## 2015-01-07 DIAGNOSIS — F419 Anxiety disorder, unspecified: Secondary | ICD-10-CM | POA: Insufficient documentation

## 2015-01-07 DIAGNOSIS — R1031 Right lower quadrant pain: Secondary | ICD-10-CM | POA: Insufficient documentation

## 2015-01-07 LAB — CBC WITH DIFFERENTIAL/PLATELET
Basophils Absolute: 0 10*3/uL (ref 0.0–0.1)
Basophils Relative: 0 % (ref 0–1)
Eosinophils Absolute: 0.2 10*3/uL (ref 0.0–0.7)
Eosinophils Relative: 4 % (ref 0–5)
HCT: 43.5 % (ref 36.0–46.0)
HEMOGLOBIN: 14.1 g/dL (ref 12.0–15.0)
LYMPHS PCT: 28 % (ref 12–46)
Lymphs Abs: 1.4 10*3/uL (ref 0.7–4.0)
MCH: 28.7 pg (ref 26.0–34.0)
MCHC: 32.4 g/dL (ref 30.0–36.0)
MCV: 88.4 fL (ref 78.0–100.0)
Monocytes Absolute: 0.6 10*3/uL (ref 0.1–1.0)
Monocytes Relative: 12 % (ref 3–12)
NEUTROS PCT: 56 % (ref 43–77)
Neutro Abs: 2.8 10*3/uL (ref 1.7–7.7)
Platelets: 216 10*3/uL (ref 150–400)
RBC: 4.92 MIL/uL (ref 3.87–5.11)
RDW: 13.3 % (ref 11.5–15.5)
WBC: 5.1 10*3/uL (ref 4.0–10.5)

## 2015-01-07 LAB — URINALYSIS, ROUTINE W REFLEX MICROSCOPIC
GLUCOSE, UA: NEGATIVE mg/dL
Hgb urine dipstick: NEGATIVE
KETONES UR: 40 mg/dL — AB
LEUKOCYTES UA: NEGATIVE
NITRITE: NEGATIVE
Protein, ur: NEGATIVE mg/dL
Urobilinogen, UA: 0.2 mg/dL (ref 0.0–1.0)
pH: 6 (ref 5.0–8.0)

## 2015-01-07 LAB — BASIC METABOLIC PANEL
ANION GAP: 5 (ref 5–15)
BUN: 9 mg/dL (ref 6–23)
CO2: 27 mmol/L (ref 19–32)
Calcium: 8.9 mg/dL (ref 8.4–10.5)
Chloride: 107 mmol/L (ref 96–112)
Creatinine, Ser: 0.87 mg/dL (ref 0.50–1.10)
Glucose, Bld: 97 mg/dL (ref 70–99)
Potassium: 3.9 mmol/L (ref 3.5–5.1)
SODIUM: 139 mmol/L (ref 135–145)

## 2015-01-07 LAB — POC URINE PREG, ED: Preg Test, Ur: NEGATIVE

## 2015-01-07 MED ORDER — CYCLOBENZAPRINE HCL 10 MG PO TABS: 10.0000 mg | ORAL_TABLET | Freq: Two times a day (BID) | ORAL | Status: DC | PRN

## 2015-01-07 MED ORDER — ONDANSETRON HCL 4 MG/2ML IJ SOLN
4.0000 mg | Freq: Once | INTRAMUSCULAR | Status: AC
  Administered 2015-01-07: 4 mg via INTRAVENOUS
  Filled 2015-01-07: qty 2

## 2015-01-07 MED ORDER — SODIUM CHLORIDE 0.9 % IV SOLN
INTRAVENOUS | Status: DC
  Administered 2015-01-07: 11:00:00 via INTRAVENOUS

## 2015-01-07 MED ORDER — NAPROXEN 500 MG PO TABS: 500.0000 mg | ORAL_TABLET | Freq: Two times a day (BID) | ORAL | Status: DC

## 2015-01-07 MED ORDER — KETOROLAC TROMETHAMINE 30 MG/ML IJ SOLN
30.0000 mg | Freq: Once | INTRAMUSCULAR | Status: AC
  Administered 2015-01-07: 30 mg via INTRAVENOUS
  Filled 2015-01-07: qty 1

## 2015-01-07 NOTE — ED Notes (Signed)
Pt alert & oriented x4, stable gait. Patient given discharge instructions, paperwork & prescription(s). Patient  instructed to stop at the registration desk to finish any additional paperwork. Patient verbalized understanding. Pt left department w/ no further questions. 

## 2015-01-07 NOTE — ED Notes (Signed)
Pt co rt lower back pain x2 days, no injury, pt woke up with pain that radiates down rt leg. Pt states the pain makes her feel nauseated and she cannot sleep.

## 2015-01-07 NOTE — ED Provider Notes (Signed)
CSN: [id number]     Arrival date & time 01/07/15  4098 History   First MD Initiated Contact with Patient 01/07/15 [phone]     Chief Complaint  Patient presents with  . Back Pain     (Consider location/radiation/quality/duration/timing/severity/associated sxs/prior Treatment) Patient is a 24 y.o. female presenting with flank pain. The history is provided by the patient.  Flank Pain This is a new problem. The current episode started in the past 7 days. The problem has been gradually worsening. Associated symptoms include abdominal pain, nausea and vomiting. Nothing aggravates the symptoms. She has tried NSAIDs for the symptoms. The treatment provided no relief.   Hannah Ryan is a 24 y.o. morbidly obese female who presents to the ED with low back pain that started 2 days ago. She reports difficulty with urination. The pain gets so bad that it causes nausea and vomiting. She has been taking ibuprofen for pain without relief. The pain is in the right flank area and radiates to the right lower abdomen and suprapubic area. She states the pain now goes down her right leg.  Past Medical History  Diagnosis Date  . Hypertension   . Anxiety     per patient/family  . Depression     per patient/family  . Obesity   . Diabetes mellitus without complication   . Asthma    Past Surgical History  Procedure Laterality Date  . Foot surgery      left  . Tonsillectomy    . Adenoidectomy    . Irrigation and debridement abscess  01/08/2012    Procedure: IRRIGATION AND DEBRIDEMENT ABSCESS;  Surgeon: Fabio Bering, MD;  Location: [facility];  Service: General;  Laterality: Right;   History reviewed. No pertinent family history. History  Substance Use Topics  . Smoking status: Current Every Day Smoker -- 1.00 packs/day for 3 years    Types: Cigarettes  . Smokeless tobacco: Not on file  . Alcohol Use: No   OB History    No data available     Review of Systems  Gastrointestinal: Positive for nausea,  vomiting and abdominal pain.  Genitourinary: Positive for flank pain.  Musculoskeletal: Positive for back pain.   All other systems negative   Allergies  Review of patient's allergies indicates no known allergies.  Home Medications   Prior to Admission medications   Medication Sig Start Date End Date Taking? Authorizing Provider  ibuprofen (ADVIL,MOTRIN) 200 MG tablet Take 800 mg by mouth every 6 (six) hours as needed for moderate pain.   Yes Historical Provider, MD  LORazepam (ATIVAN) 1 MG tablet Take 1 tablet (1 mg total) by mouth every 6 (six) hours as needed for anxiety. Patient not taking: Reported on 01/07/2015 07/24/13   Joni Reining Pisciotta, PA-C   BP 155/103 mmHg  Pulse 63  Temp(Src) 97.6 F (36.4 C) (Oral)  Resp 20  Ht  (1.727 m)  Wt 330 lb (149.687 kg)  BMI 50.19 kg/m2  SpO2 100%  LMP 12/31/2014 Physical Exam  Constitutional: She is oriented to person, place, and time. She appears well-developed and well-nourished. No distress.  HENT:  Head: Normocephalic and atraumatic.  Eyes: Conjunctivae and EOM are normal.  Neck: Neck supple.  Cardiovascular: Normal rate and regular rhythm.   Pulmonary/Chest: Effort normal and breath sounds normal.  Abdominal: Soft. Bowel sounds are normal. There is tenderness in the right lower quadrant. There is no rebound and no guarding. CVA tenderness: right flank tenderness.  Musculoskeletal:  Lumbar back: She exhibits tenderness and spasm. She exhibits normal pulse.       Back:  Pain with bending forward and side to side.   Neurological: She is alert and oriented to person, place, and time. No cranial nerve deficit.  Skin: Skin is warm and dry.  Psychiatric: She has a normal mood and affect. Her behavior is normal.  Nursing note and vitals reviewed.   ED Course  Procedures (including critical care time) Labs Review Results for orders placed or performed during the hospital encounter of 01/07/15 (from the past 24 hour(s))    CBC with Differential/Platelet     Status: None   Collection Time: 01/07/15 10:33 AM  Result Value Ref Range   WBC 5.1 4.0 - 10.5 K/uL   RBC 4.92 3.87 - 5.11 MIL/uL   Hemoglobin 14.1 12.0 - 15.0 g/dL   HCT 45.4 09.8 - 11.9 %   MCV 88.4 78.0 - 100.0 fL   MCH 28.7 26.0 - 34.0 pg   MCHC 32.4 30.0 - 36.0 g/dL   RDW 14.7 82.9 - 56.2 %   Platelets 216 150 - 400 K/uL   Neutrophils Relative % 56 43 - 77 %   Neutro Abs 2.8 1.7 - 7.7 K/uL   Lymphocytes Relative 28 12 - 46 %   Lymphs Abs 1.4 0.7 - 4.0 K/uL   Monocytes Relative 12 3 - 12 %   Monocytes Absolute 0.6 0.1 - 1.0 K/uL   Eosinophils Relative 4 0 - 5 %   Eosinophils Absolute 0.2 0.0 - 0.7 K/uL   Basophils Relative 0 0 - 1 %   Basophils Absolute 0.0 0.0 - 0.1 K/uL  Basic metabolic panel     Status: None   Collection Time: 01/07/15 10:33 AM  Result Value Ref Range   Sodium 139 135 - 145 mmol/L   Potassium 3.9 3.5 - 5.1 mmol/L   Chloride 107 96 - 112 mmol/L   CO2 27 19 - 32 mmol/L   Glucose, Bld 97 70 - 99 mg/dL   BUN 9 6 - 23 mg/dL   Creatinine, Ser 1.30 0.50 - 1.10 mg/dL   Calcium 8.9 8.4 - 86.5 mg/dL   GFR calc non Af Amer >90 >90 mL/min   GFR calc Af Amer >90 >90 mL/min   Anion gap 5 5 - 15  Urinalysis, Routine w reflex microscopic     Status: Abnormal   Collection Time: 01/07/15 10:46 AM  Result Value Ref Range   Color, Urine YELLOW YELLOW   APPearance HAZY (A) CLEAR   Specific Gravity, Urine >1.030 (H) 1.005 - 1.030   pH 6.0 5.0 - 8.0   Glucose, UA NEGATIVE NEGATIVE mg/dL   Hgb urine dipstick NEGATIVE NEGATIVE   Bilirubin Urine SMALL (A) NEGATIVE   Ketones, ur 40 (A) NEGATIVE mg/dL   Protein, ur NEGATIVE NEGATIVE mg/dL   Urobilinogen, UA 0.2 0.0 - 1.0 mg/dL   Nitrite NEGATIVE NEGATIVE   Leukocytes, UA NEGATIVE NEGATIVE  POC urine preg, ED (not at [facility])     Status: None   Collection Time: 01/07/15 11:31 AM  Result Value Ref Range   Preg Test, Ur NEGATIVE NEGATIVE    Fluids, Labs, pain management, medication for  nausea, patient symptoms improved.  MDM  24 y.o. female with back pain that increases with movement and palpation that radiates to the right side of abdomen.  Urine does not show infection or hematuria, blood work normal. Will treat for muscle spasm and patient will follow  up with her PCP. She will return here as needed for worsening symptoms. Stable for d/c without focal neuro deficits and no acute abdomen.  Discussed with the patient clinical and lab findings and plan of care. All questioned fully answered. She voices understanding and agrees with plan.       [address]Hope VernonM Imraan Wendell, [place]NP 01/09/15 [phone]  Benjiman CoreNathan Pickering, MD 01/11/15 1600

## 2017-02-13 ENCOUNTER — Encounter (HOSPITAL_COMMUNITY): Payer: Self-pay

## 2017-02-13 ENCOUNTER — Emergency Department (HOSPITAL_COMMUNITY)
Admission: EM | Admit: 2017-02-13 | Discharge: 2017-02-13 | Disposition: A | Discharge status: Home / Self Care | Payer: Self-pay | Attending: Emergency Medicine | Admitting: Emergency Medicine

## 2017-02-13 DIAGNOSIS — Z79899 Other long term (current) drug therapy: Secondary | ICD-10-CM | POA: Insufficient documentation

## 2017-02-13 DIAGNOSIS — N739 Female pelvic inflammatory disease, unspecified: Secondary | ICD-10-CM | POA: Insufficient documentation

## 2017-02-13 DIAGNOSIS — R112 Nausea with vomiting, unspecified: Secondary | ICD-10-CM

## 2017-02-13 DIAGNOSIS — A599 Trichomoniasis, unspecified: Secondary | ICD-10-CM

## 2017-02-13 DIAGNOSIS — I10 Essential (primary) hypertension: Secondary | ICD-10-CM | POA: Insufficient documentation

## 2017-02-13 DIAGNOSIS — E119 Type 2 diabetes mellitus without complications: Secondary | ICD-10-CM | POA: Insufficient documentation

## 2017-02-13 DIAGNOSIS — J45909 Unspecified asthma, uncomplicated: Secondary | ICD-10-CM | POA: Insufficient documentation

## 2017-02-13 DIAGNOSIS — F17210 Nicotine dependence, cigarettes, uncomplicated: Secondary | ICD-10-CM | POA: Insufficient documentation

## 2017-02-13 DIAGNOSIS — N730 Acute parametritis and pelvic cellulitis: Secondary | ICD-10-CM

## 2017-02-13 LAB — COMPREHENSIVE METABOLIC PANEL
ALT: 10 U/L — AB (ref 14–54)
AST: 21 U/L (ref 15–41)
Albumin: 3.5 g/dL (ref 3.5–5.0)
Alkaline Phosphatase: 45 U/L (ref 38–126)
Anion gap: 5 (ref 5–15)
BUN: 11 mg/dL (ref 6–20)
CHLORIDE: 108 mmol/L (ref 101–111)
CO2: 28 mmol/L (ref 22–32)
CREATININE: 0.72 mg/dL (ref 0.44–1.00)
Calcium: 8.7 mg/dL — ABNORMAL LOW (ref 8.9–10.3)
GFR calc Af Amer: >60 mL/min (ref >60)
GFR calc non Af Amer: >60 mL/min (ref >60)
Glucose, Bld: 100 mg/dL — ABNORMAL HIGH (ref 65–99)
Potassium: 4.3 mmol/L (ref 3.5–5.1)
SODIUM: 141 mmol/L (ref 135–145)
Total Bilirubin: 0.5 mg/dL (ref 0.3–1.2)
Total Protein: 6.2 g/dL — ABNORMAL LOW (ref 6.5–8.1)

## 2017-02-13 LAB — URINALYSIS, ROUTINE W REFLEX MICROSCOPIC
Bacteria, UA: NONE SEEN
Bilirubin Urine: NEGATIVE
GLUCOSE, UA: NEGATIVE mg/dL
Ketones, ur: NEGATIVE mg/dL
Leukocytes, UA: NEGATIVE
Nitrite: NEGATIVE
PROTEIN: NEGATIVE mg/dL
Specific Gravity, Urine: 1.019 (ref 1.005–1.030)
pH: 7 (ref 5.0–8.0)

## 2017-02-13 LAB — CBC
HCT: 38.6 % (ref 36.0–46.0)
Hemoglobin: 12.5 g/dL (ref 12.0–15.0)
MCH: 28.4 pg (ref 26.0–34.0)
MCHC: 32.4 g/dL (ref 30.0–36.0)
MCV: 87.7 fL (ref 78.0–100.0)
Platelets: 235 10*3/uL (ref 150–400)
RBC: 4.4 MIL/uL (ref 3.87–5.11)
RDW: 14 % (ref 11.5–15.5)
WBC: 3.9 10*3/uL — ABNORMAL LOW (ref 4.0–10.5)

## 2017-02-13 LAB — I-STAT BETA HCG BLOOD, ED (MC, WL, AP ONLY): I-stat hCG, quantitative: <5 m[IU]/mL (ref <5)

## 2017-02-13 LAB — WET PREP, GENITAL
Sperm: NONE SEEN
WBC WET PREP: NONE SEEN
Yeast Wet Prep HPF POC: NONE SEEN

## 2017-02-13 LAB — LIPASE, BLOOD: LIPASE: 26 U/L (ref 11–51)

## 2017-02-13 MED ORDER — ONDANSETRON HCL 4 MG PO TABS: 4.0000 mg | ORAL_TABLET | Freq: Four times a day (QID) | ORAL | 0 refills | Status: DC

## 2017-02-13 MED ORDER — CEFTRIAXONE SODIUM 250 MG IJ SOLR
250.0000 mg | Freq: Once | INTRAMUSCULAR | Status: AC
  Administered 2017-02-13: 250 mg via INTRAMUSCULAR
  Filled 2017-02-13: qty 250

## 2017-02-13 MED ORDER — DOXYCYCLINE HYCLATE 100 MG PO CAPS: 100.0000 mg | ORAL_CAPSULE | Freq: Two times a day (BID) | ORAL | 0 refills | Status: DC

## 2017-02-13 MED ORDER — ONDANSETRON 4 MG PO TBDP
8.0000 mg | ORAL_TABLET | Freq: Once | ORAL | Status: AC
  Administered 2017-02-13: 8 mg via ORAL
  Filled 2017-02-13: qty 2

## 2017-02-13 MED ORDER — ONDANSETRON 4 MG PO TBDP: 4.0000 mg | ORAL_TABLET | Freq: Once | ORAL | Status: DC | PRN

## 2017-02-13 MED ORDER — STERILE WATER FOR INJECTION IJ SOLN
INTRAMUSCULAR | Status: AC
  Administered 2017-02-13: 1 mL
  Filled 2017-02-13: qty 10

## 2017-02-13 MED ORDER — METRONIDAZOLE 500 MG PO TABS
2000.0000 mg | ORAL_TABLET | Freq: Once | ORAL | Status: AC
  Administered 2017-02-13: 2000 mg via ORAL
  Filled 2017-02-13: qty 4

## 2017-02-13 MED ORDER — KETOROLAC TROMETHAMINE 60 MG/2ML IM SOLN
30.0000 mg | Freq: Once | INTRAMUSCULAR | Status: AC
Start: 2017-02-13 — End: 2017-02-13
  Administered 2017-02-13: 30 mg via INTRAMUSCULAR
  Filled 2017-02-13: qty 2

## 2017-02-13 MED ORDER — DOXYCYCLINE HYCLATE 100 MG PO TABS
100.0000 mg | ORAL_TABLET | Freq: Once | ORAL | Status: AC
  Administered 2017-02-13: 100 mg via ORAL
  Filled 2017-02-13: qty 1

## 2017-02-13 NOTE — ED Triage Notes (Signed)
Pt presents for evaluation of generalized abd pain and lower back pain with N/V x 3 days. Pt reports began menstrual cycle 4/20 and has had some brown discharge. Pt concerned over possible std exposure. Reports constipation but stated took stool softners and had normal BM this AM.

## 2017-02-13 NOTE — ED Provider Notes (Signed)
[facility] DEPT Provider Note   CSN: [id number] Arrival date & time: 02/13/17  1220     History   Chief Complaint Chief Complaint  Patient presents with  . Abdominal Pain    HPI Hannah Ryan is a 26 y.o. female.  HPI Hannah Ryan is a 26 y.o. female with history of hypertension, diabetes, asthma, anxiety, obesity, presents to emergency department with complaint of abdominal pain, nausea, vomiting, vaginal discomfort. Patient states her symptoms started 3 days ago. She states her abdominal pain that radiates into the back. Reports she is currently on her menstrual cycle but also reports some discharge and discomfort in the vaginal area. States she may have been exposed to STDs. She reports associated nausea and vomiting, states unable to keep anything down. She reports initial constipation, states she took a laxative and now having diarrhea. Denies any fever or chills. Reports some urinary frequency and urgency. 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 She has been taking Tylenol and Motrin for her symptoms, but states unsure if she kept anything down. Denies pregnancy. No other complaints.  Past Medical History:  Diagnosis Date  . Anxiety    per patient/family  . Asthma   . Depression    per patient/family  . Diabetes mellitus without complication (HCC)   . Hypertension   . Obesity     Patient Active Problem List   Diagnosis Date Noted  . Cellulitis and abscess of foot 01/05/2012  . Benign hypertension 01/05/2012  . Morbid obesity (HCC) 01/05/2012    Past Surgical History:  Procedure Laterality Date  . ADENOIDECTOMY    . FOOT SURGERY     left  . IRRIGATION AND DEBRIDEMENT ABSCESS  01/08/2012   Procedure: IRRIGATION AND DEBRIDEMENT ABSCESS;  Surgeon: Fabio Bering, MD;  Location: [facility];  Service: General;  Laterality: Right;  . TONSILLECTOMY      OB History    No data available       Home  Medications    Prior to Admission medications   Medication Sig Start Date End Date Taking? Authorizing Provider  cyclobenzaprine (FLEXERIL) 10 MG tablet Take 1 tablet (10 mg total) by mouth 2 (two) times daily as needed for muscle spasms. 01/07/15   Hope Orlene Och, NP  LORazepam (ATIVAN) 1 MG tablet Take 1 tablet (1 mg total) by mouth every 6 (six) hours as needed for anxiety. Patient not taking: Reported on 01/07/2015 07/24/13   Joni Reining Pisciotta, PA-C  naproxen (NAPROSYN) 500 MG tablet Take 1 tablet (500 mg total) by mouth 2 (two) times daily. 01/07/15   Hope Orlene Och, NP    Family History No family history on file.  Social History Social History  Substance Use Topics  . Smoking status: Current Every Day Smoker    Packs/day: 1.00    Years: 3.00    Types: Cigarettes  . Smokeless tobacco: Not on file  . Alcohol use No     Allergies   Patient has no known allergies.   Review of Systems Review of Systems  Constitutional: Negative for chills and fever.  Respiratory: Negative for cough, chest tightness and shortness of breath.   Cardiovascular: Negative for chest pain, palpitations and leg swelling.  Gastrointestinal: Positive for abdominal pain, constipation, diarrhea, nausea and vomiting. Negative for blood in stool.  Genitourinary: Positive for dysuria, frequency, pelvic pain, vaginal bleeding, vaginal discharge and vaginal pain. Negative for flank pain.  Musculoskeletal: Positive for back pain. Negative for arthralgias, myalgias, neck pain and neck stiffness.  Skin: Negative for rash.  Neurological: Negative for dizziness, weakness and headaches.  All other systems reviewed and are negative.    Physical Exam Updated Vital Signs BP 132/86 (BP Location: Right Arm)   Pulse 74   Temp 97.9 F (36.6 C) (Oral)   Resp 18   LMP 02/09/2017 (Exact Date)   SpO2 100%   Physical Exam  Constitutional: She appears well-developed and well-nourished. No distress.  HENT:  Head:  Normocephalic.  Eyes: Conjunctivae are normal.  Neck: Neck supple.  Cardiovascular: Normal rate, regular rhythm and normal heart sounds.   Pulmonary/Chest: Effort normal and breath sounds normal. No respiratory distress. She has no wheezes. She has no rales.  Abdominal: Soft. Bowel sounds are normal. She exhibits no distension. There is tenderness. There is no rebound.  Suprapubic and bilateral adnexal tenderness  Genitourinary:  Genitourinary Comments: Normal external genitalia. Small bleeding and vaginal canal. Cervix is normal. Close. Positive cervical motion tenderness. Uterine tenderness and bilateral external tenderness. Exam is limited by body habitus.  Musculoskeletal: She exhibits no edema.  Neurological: She is alert.  Skin: Skin is warm and dry.  Psychiatric: She has a normal mood and affect. Her behavior is normal.  Nursing note and vitals reviewed.    ED Treatments / Results  Labs (all labs ordered are listed, but only abnormal results are displayed) Labs Reviewed  WET PREP, GENITAL - Abnormal; Notable for the following:       Result Value   Trich, Wet Prep PRESENT (*)    Clue Cells Wet Prep HPF POC PRESENT (*)    All other components within normal limits  COMPREHENSIVE METABOLIC PANEL - Abnormal; Notable for the following:    Glucose, Bld 100 (*)    Calcium 8.7 (*)    Total Protein 6.2 (*)    ALT 10 (*)    All other components within normal limits  CBC - Abnormal; Notable for the following:    WBC 3.9 (*)    All other components within normal limits  URINALYSIS, ROUTINE W REFLEX MICROSCOPIC - Abnormal; Notable for the following:    APPearance HAZY (*)    Hgb urine dipstick LARGE (*)    Squamous Epithelial / LPF 0-5 (*)    All other components within normal limits  LIPASE, BLOOD  I-STAT BETA HCG BLOOD, ED ([facility], [facility], [facility] ONLY)  GC/CHLAMYDIA PROBE AMP ([place]) NOT AT [facility]    EKG  EKG Interpretation None       Radiology No results  found.  Procedures Procedures (including critical care time)  Medications Ordered in ED Medications  ondansetron (ZOFRAN-ODT) disintegrating tablet 8 mg (not administered)  ketorolac (TORADOL) injection 30 mg (not administered)     Initial Impression / Assessment and Plan / ED Course  I have reviewed the triage vital signs and the nursing notes.  Pertinent labs & imaging results that were available during my care of the patient were reviewed by me and considered in my medical decision making (see chart for details).     Patient seen and examined. Patient with pelvic pain, nausea, vomiting, constipation and diabetes. Patient is morbidly obese. Exam limited by body habitus. We will try Zofran for nausea, Toradol for pain, initial blood work with no significant findings to explain her pain, pregnancy test negative. We will add urinalysis and perform pelvic exam. Vital signs are normal here, she is afebrile, not tachycardic or  hypotensive, no concern for sepsis or acute abdomen.  5:38 PM Patient states her nausea improved. Her pelvic exam concerning for PID. Wet prep shows no WBCs, however is states that sample is too diluted. It also shows trichomonas. Will treat with Rocephin 250 mg IM, doxycycline 100 mg, Flagyl 2 g by mouth. Discussed results with patient. Advised all partners to be treated.  6:08 PM Tolerating PO. Will dc home with doxycycline and zofran. Follow up with pcp. Return precautions discussed.   Vitals:   02/13/17 1245  BP: 132/86  Pulse: 74  Resp: 18  Temp: 97.9 F (36.6 C)  TempSrc: Oral  SpO2: 100%     Final Clinical Impressions(s) / ED Diagnoses   Final diagnoses:  PID (acute pelvic inflammatory disease)  Nausea and vomiting, intractability of vomiting not specified, unspecified vomiting type  Trichimoniasis    New Prescriptions New Prescriptions   DOXYCYCLINE (VIBRAMYCIN) 100 MG CAPSULE    Take 1 capsule (100 mg total) by mouth 2 (two) times daily.    ONDANSETRON (ZOFRAN) 4 MG TABLET    Take 1 tablet (4 mg total) by mouth every 6 (six) hours.     Jaynie Crumble, PA-C 02/13/17 1808    Nira Conn, MD 02/14/17 [phone]

## 2017-02-13 NOTE — Discharge Instructions (Signed)
Take a Zofran is prescribed as needed for nausea. Take doxycycline until all gone for pelvic infection. Follow up with family doctor. Return if worsening symptoms.

## 2017-02-14 LAB — GC/CHLAMYDIA PROBE AMP (~~LOC~~) NOT AT ARMC
Chlamydia: NEGATIVE
Neisseria Gonorrhea: NEGATIVE

## 2017-04-10 ENCOUNTER — Other Ambulatory Visit: Payer: Self-pay

## 2017-04-10 ENCOUNTER — Emergency Department (HOSPITAL_COMMUNITY)
Admission: EM | Admit: 2017-04-10 | Discharge: 2017-04-10 | Disposition: A | Discharge status: Home / Self Care | Payer: Self-pay | Attending: Emergency Medicine | Admitting: Emergency Medicine

## 2017-04-10 ENCOUNTER — Encounter (HOSPITAL_COMMUNITY): Payer: Self-pay

## 2017-04-10 ENCOUNTER — Emergency Department (HOSPITAL_COMMUNITY): Payer: Self-pay

## 2017-04-10 DIAGNOSIS — E119 Type 2 diabetes mellitus without complications: Secondary | ICD-10-CM | POA: Insufficient documentation

## 2017-04-10 DIAGNOSIS — R0981 Nasal congestion: Secondary | ICD-10-CM | POA: Insufficient documentation

## 2017-04-10 DIAGNOSIS — J45909 Unspecified asthma, uncomplicated: Secondary | ICD-10-CM | POA: Insufficient documentation

## 2017-04-10 DIAGNOSIS — F17210 Nicotine dependence, cigarettes, uncomplicated: Secondary | ICD-10-CM | POA: Insufficient documentation

## 2017-04-10 DIAGNOSIS — J40 Bronchitis, not specified as acute or chronic: Secondary | ICD-10-CM | POA: Insufficient documentation

## 2017-04-10 DIAGNOSIS — R079 Chest pain, unspecified: Secondary | ICD-10-CM | POA: Insufficient documentation

## 2017-04-10 DIAGNOSIS — I10 Essential (primary) hypertension: Secondary | ICD-10-CM | POA: Insufficient documentation

## 2017-04-10 DIAGNOSIS — R062 Wheezing: Secondary | ICD-10-CM | POA: Insufficient documentation

## 2017-04-10 LAB — BASIC METABOLIC PANEL
Anion gap: 7 (ref 5–15)
BUN: 7 mg/dL (ref 6–20)
CO2: 27 mmol/L (ref 22–32)
CREATININE: 0.7 mg/dL (ref 0.44–1.00)
Calcium: 8.9 mg/dL (ref 8.9–10.3)
Chloride: 107 mmol/L (ref 101–111)
GFR calc non Af Amer: >60 mL/min (ref >60)
Glucose, Bld: 98 mg/dL (ref 65–99)
Potassium: 3.9 mmol/L (ref 3.5–5.1)
Sodium: 141 mmol/L (ref 135–145)

## 2017-04-10 LAB — CBC
HCT: 37.1 % (ref 36.0–46.0)
HEMOGLOBIN: 11.9 g/dL — AB (ref 12.0–15.0)
MCH: 27.8 pg (ref 26.0–34.0)
MCHC: 32.1 g/dL (ref 30.0–36.0)
MCV: 86.7 fL (ref 78.0–100.0)
PLATELETS: 355 10*3/uL (ref 150–400)
RBC: 4.28 MIL/uL (ref 3.87–5.11)
RDW: 13.7 % (ref 11.5–15.5)
WBC: 9.4 10*3/uL (ref 4.0–10.5)

## 2017-04-10 LAB — I-STAT TROPONIN, ED: Troponin i, poc: 0.01 ng/mL (ref 0.00–0.08)

## 2017-04-10 MED ORDER — AZITHROMYCIN 250 MG PO TABS: 250.0000 mg | ORAL_TABLET | Freq: Every day | ORAL | 0 refills | Status: DC

## 2017-04-10 MED ORDER — ALBUTEROL SULFATE HFA 108 (90 BASE) MCG/ACT IN AERS: 1.0000 | INHALATION_SPRAY | Freq: Four times a day (QID) | RESPIRATORY_TRACT | 0 refills | Status: DC | PRN

## 2017-04-10 MED ORDER — IPRATROPIUM BROMIDE 0.02 % IN SOLN
0.5000 mg | Freq: Once | RESPIRATORY_TRACT | Status: AC
  Administered 2017-04-10: 0.5 mg via RESPIRATORY_TRACT
  Filled 2017-04-10: qty 2.5

## 2017-04-10 MED ORDER — PREDNISONE 20 MG PO TABS: ORAL_TABLET | ORAL | 0 refills | Status: DC

## 2017-04-10 MED ORDER — PREDNISONE 20 MG PO TABS
60.0000 mg | ORAL_TABLET | Freq: Once | ORAL | Status: AC
  Administered 2017-04-10: 60 mg via ORAL
  Filled 2017-04-10: qty 3

## 2017-04-10 MED ORDER — ALBUTEROL SULFATE (2.5 MG/3ML) 0.083% IN NEBU
5.0000 mg | INHALATION_SOLUTION | Freq: Once | RESPIRATORY_TRACT | Status: AC
  Administered 2017-04-10: 5 mg via RESPIRATORY_TRACT
  Filled 2017-04-10: qty 6

## 2017-04-10 NOTE — ED Triage Notes (Signed)
Per Pt, Pt is coming from home with complaints of right sided chest pain that radiates to her back that woke her from her sleep this morning. Reports it to be sharp and stabbing. Complains of congestion and cough x 3 days.

## 2017-04-10 NOTE — ED Provider Notes (Signed)
[facility] Provider Note   CSN: [id number] Arrival date & time: 04/10/17  1757  By signing my name below, I, Hannah Ryan, attest that this documentation has been prepared under the direction and in the presence of Sharilyn Sites, PA-C.  Electronically Signed: Rosario Ryan, ED Scribe. 04/10/17. 7:46 PM.  History   Chief Complaint Chief Complaint  Patient presents with  . Chest Pain   The history is provided by the patient. No language interpreter was used.    HPI Comments: Hannah Ryan is a 26 y.o. female with a PMHx of DM, obesity, HTN, and asthma, who presents to the Emergency Department complaining of persistent, worsening right-sided chest pain beginning two days ago. She notes associated congestion, wheezing, mild shortness of breath, and productive cough of mucus. Her chest pain is acutely worse secondary to coughing. Pt has been using her albuterol inhaler at home without significant relief of her symptoms. She is a current everyday smoker. One of her children has recently been sick with similar cough and congestion as well. No h/o CAD. She denies leg swelling, fever, or any other associated symptoms.   Past Medical History:  Diagnosis Date  . Anxiety    per patient/family  . Asthma   . Depression    per patient/family  . Diabetes mellitus without complication (HCC)   . Hypertension   . Obesity    Patient Active Problem List   Diagnosis Date Noted  . Cellulitis and abscess of foot 01/05/2012  . Benign hypertension 01/05/2012  . Morbid obesity (HCC) 01/05/2012   Past Surgical History:  Procedure Laterality Date  . ADENOIDECTOMY    . FOOT SURGERY     left  . IRRIGATION AND DEBRIDEMENT ABSCESS  01/08/2012   Procedure: IRRIGATION AND DEBRIDEMENT ABSCESS;  Surgeon: Fabio Bering, MD;  Location: [facility];  Service: General;  Laterality: Right;  . TONSILLECTOMY     OB History    No data available     Home Medications    Prior to Admission  medications   Medication Sig Start Date End Date Taking? Authorizing Provider  cyclobenzaprine (FLEXERIL) 10 MG tablet Take 1 tablet (10 mg total) by mouth 2 (two) times daily as needed for muscle spasms. 01/07/15   Janne Napoleon, NP  doxycycline (VIBRAMYCIN) 100 MG capsule Take 1 capsule (100 mg total) by mouth 2 (two) times daily. 02/13/17   Kirichenko, Tatyana, PA-C  LORazepam (ATIVAN) 1 MG tablet Take 1 tablet (1 mg total) by mouth every 6 (six) hours as needed for anxiety. Patient not taking: Reported on 01/07/2015 07/24/13   Pisciotta, Joni Reining, PA-C  naproxen (NAPROSYN) 500 MG tablet Take 1 tablet (500 mg total) by mouth 2 (two) times daily. 01/07/15   Janne Napoleon, NP  ondansetron (ZOFRAN) 4 MG tablet Take 1 tablet (4 mg total) by mouth every 6 (six) hours. 02/13/17   Jaynie Crumble, PA-C   Family History No family history on file.  Social History Social History  Substance Use Topics  . Smoking status: Current Every Day Smoker    Packs/day: 1.00    Years: 3.00    Types: Cigarettes  . Smokeless tobacco: Never Used  . Alcohol use Yes     Comment: occasionally    Allergies   Patient has no known allergies.   Review of Systems Review of Systems  Constitutional: Negative for fever.  HENT: Positive for congestion.   Respiratory: Positive for cough, shortness of breath and wheezing.  Cardiovascular: Positive for chest pain. Negative for leg swelling.  All other systems reviewed and are negative.  Physical Exam Updated Vital Signs BP 138/86 (BP Location: Left Arm)   Pulse 85   Temp 97.9 F (36.6 C) (Oral)   Resp 18   Ht 5' 8.5" (1.74 m)   Wt (!) 350 lb (158.8 kg)   LMP 04/04/2017   SpO2 99%   BMI 52.44 kg/m   Physical Exam  Constitutional: She appears well-developed and well-nourished. No distress.  HENT:  Head: Normocephalic and atraumatic.  Right Ear: Tympanic membrane and ear canal normal.  Left Ear: Tympanic membrane and ear canal normal.  Nose: Mucosal edema  present.  Mouth/Throat: Uvula is midline, oropharynx is clear and moist and mucous membranes are normal.  + congestion with PND  Eyes: Conjunctivae are normal.  Neck: Normal range of motion.  Cardiovascular: Normal rate, regular rhythm and normal heart sounds.   No murmur heard. Pulmonary/Chest: Effort normal. No respiratory distress. She has wheezes. She has no rales.  Expiratory wheezes noted, worse in the upper lung fields. No distress. Able to speak in full sentences without difficulty.   Abdominal: She exhibits no distension.  Musculoskeletal: Normal range of motion.  Neurological: She is alert.  Skin: No pallor.  Psychiatric: She has a normal mood and affect. Her behavior is normal.  Nursing note and vitals reviewed.  ED Treatments / Results  DIAGNOSTIC STUDIES: Oxygen Saturation is 99% on RA, normal by my interpretation.   COORDINATION OF CARE: 7:46 PM-Discussed next steps with pt. Pt verbalized understanding and is agreeable with the plan.   Labs (all labs ordered are listed, but only abnormal results are displayed) Labs Reviewed  CBC - Abnormal; Notable for the following:       Result Value   Hemoglobin 11.9 (*)    All other components within normal limits  BASIC METABOLIC PANEL  I-STAT TROPOININ, ED   EKG  EKG Interpretation None      Radiology Dg Chest 2 View  Result Date: 04/10/2017 CLINICAL DATA:  Stabbing RIGHT-side chest pain since yesterday, so intense it is hard to breathe, gasping for air, head congestion and cough, sore throat, uncontrolled hypertension, diabetes mellitus, asthma EXAM: CHEST  2 VIEW COMPARISON:  08/05/2008 FINDINGS: Normal heart size, mediastinal contours, and pulmonary vascularity. Mild peribronchial thickening. No pulmonary infiltrate, pleural effusion, or pneumothorax. Bones unremarkable. IMPRESSION: Mild peribronchial thickening which could reflect bronchitis or asthma. No acute infiltrate. Electronically Signed   By: Ulyses SouthwardMark  Boles  M.D.   On: 04/10/2017 19:04   Procedures Procedures   Medications Ordered in ED Medications - No data to display  Initial Impression / Assessment and Plan / ED Course  I have reviewed the triage vital signs and the nursing notes.  Pertinent labs & imaging results that were available during my care of the patient were reviewed by me and considered in my medical decision making (see chart for details).  26-year-old female with right-sided chest pain ongoing for the past 2 days. She is afebrile and nontoxic. EKG sinus rhythm without acute ischemic changes. Lab work including troponin reassuring. Chest x-ray with some bronchitic changes. On exam patient does have some expiratory wheezes. She is in no acute respiratory distress. Patient feeling much better and lung sounds cleared after neb treatment here. He'll likely etiology of her pain due to bronchitis. Lower suspicion for acute cardiac etiology such as ACS, dissection, PE, etc at this time.  We'll plan to discharge home with azithromycin,  prednisone, albuterol inhaler.  Work note provided.  Patient instructed to follow-up with PCP or wellness clinic for follow-up.  Discussed plan with patient, she acknowledged understanding and agreed with plan of care.  Return precautions given for new or worsening symptoms.  Final Clinical Impressions(s) / ED Diagnoses   Final diagnoses:  Chest pain, unspecified type  Bronchitis   New Prescriptions New Prescriptions   ALBUTEROL (PROVENTIL HFA;VENTOLIN HFA) 108 (90 BASE) MCG/ACT INHALER    Inhale 1-2 puffs into the lungs every 6 (six) hours as needed for wheezing.   AZITHROMYCIN (ZITHROMAX) 250 MG TABLET    Take 1 tablet (250 mg total) by mouth daily. Take first 2 tablets together, then 1 every day until finished.   PREDNISONE (DELTASONE) 20 MG TABLET    Take 40 mg by mouth daily for 3 days, then 20mg  by mouth daily for 3 days, then 10mg  daily for 3 days   I personally performed the services described in  this documentation, which was scribed in my presence. The recorded information has been reviewed and is accurate.   Garlon Hatchet, PA-C 04/10/17 2037    Mancel Bale, MD 04/10/17 2322

## 2017-04-10 NOTE — Discharge Instructions (Signed)
Take the prescribed medication as directed. Follow-up with your primary care doctor. If you do not have one, can try to see the wellness clinic. Return to the ED for new or worsening symptoms.

## 2017-10-04 ENCOUNTER — Encounter: Payer: Self-pay | Admitting: Adult Health

## 2017-10-04 ENCOUNTER — Other Ambulatory Visit: Payer: Self-pay

## 2017-10-04 ENCOUNTER — Ambulatory Visit: Payer: Medicaid Other | Admitting: Adult Health

## 2017-10-04 VITALS — BP 124/86 | HR 97 | Ht 69.0 in | Wt 353.0 lb

## 2017-10-04 DIAGNOSIS — R112 Nausea with vomiting, unspecified: Secondary | ICD-10-CM

## 2017-10-04 DIAGNOSIS — Z3201 Encounter for pregnancy test, result positive: Secondary | ICD-10-CM | POA: Diagnosis not present

## 2017-10-04 DIAGNOSIS — O3680X0 Pregnancy with inconclusive fetal viability, not applicable or unspecified: Secondary | ICD-10-CM

## 2017-10-04 DIAGNOSIS — N926 Irregular menstruation, unspecified: Secondary | ICD-10-CM | POA: Diagnosis not present

## 2017-10-04 DIAGNOSIS — Z3A01 Less than 8 weeks gestation of pregnancy: Secondary | ICD-10-CM | POA: Insufficient documentation

## 2017-10-04 LAB — POCT URINE PREGNANCY: PREG TEST UR: POSITIVE — AB

## 2017-10-04 MED ORDER — PRENATAL PLUS 27-1 MG PO TABS: 1.0000 | ORAL_TABLET | Freq: Every day | ORAL | 12 refills | Status: DC

## 2017-10-04 MED ORDER — PROMETHAZINE HCL 25 MG PO TABS: 25.0000 mg | ORAL_TABLET | Freq: Four times a day (QID) | ORAL | 1 refills | Status: DC | PRN

## 2017-10-04 NOTE — Progress Notes (Signed)
Subjective:     Patient ID: Hannah Ryan, female   DOB: 1991-03-12, 26 y.o.   MRN: [id number]  HPI Hannah Ryan is a 26 year old black female in for UPT,has missed a period and is having nausea and vomiting.  Review of Systems +missed period +nasuea and vomiting  Reviewed past medical,surgical, social and family history. Reviewed medications and allergies.     Objective:   Physical Exam BP 124/86 (BP Location: Left Arm, Patient Position: Sitting, Cuff Size: Large)   Pulse 97   Ht 5\' 9"  (1.753 m)   Wt (!) 353 lb (160.1 kg)   LMP 08/13/2017   BMI 52.13 kg/m UPT +, about 7+3 weeks by LMP with EDD 05/20/18.Skin warm and dry. Neck: mid line trachea, normal thyroid, good ROM, no lymphadenopathy noted. Lungs: clear to ausculation bilaterally. Cardiovascular: regular rate and rhythm. Abdomen is soft and non tender, PHQ 9 score 7, denies being suicidal or homicidal, and declines meds at this time. Pt aware of after hours call service and that [facility] and are part of [facility].     Assessment:     1. Positive pregnancy test   2. Less than [redacted] weeks gestation of pregnancy   3. Encounter to determine fetal viability of pregnancy, single or unspecified fetus       Plan:     Meds ordered this encounter  Medications  . prenatal vitamin w/FE, FA (PRENATAL 1 + 1) 27-1 MG TABS tablet    Sig: Take 1 tablet by mouth daily at 12 noon.    Dispense:  30 each    Refill:  12    Order Specific Question:   Supervising Provider    Answer:   Despina HiddenEURE, LUTHER H [[id number]]  . promethazine (PHENERGAN) 25 MG tablet    Sig: Take 1 tablet (25 mg total) by mouth every 6 (six) hours as needed for nausea or vomiting.    Dispense:  30 tablet    Refill:  1    Order Specific Question:   Supervising Provider    Answer:   Duane LopeEURE, LUTHER H [[id number]]  Can take 2 flintstone vitamins till medicaid approved Eat often Return in 1 week for dating US Review handouts on First trimester and by [facility]

## 2017-10-04 NOTE — Patient Instructions (Signed)
First Trimester of Pregnancy The first trimester of pregnancy is from week 1 until the end of week 13 (months 1 through 3). A week after a sperm fertilizes an egg, the egg will implant on the wall of the uterus. This embryo will begin to develop into a baby. Genes from you and your partner will form the baby. The female genes will determine whether the baby will be a boy or a girl. At 6-8 weeks, the eyes and face will be formed, and the heartbeat can be seen on ultrasound. At the end of 12 weeks, all the baby's organs will be formed. Now that you are pregnant, you will want to do everything you can to have a healthy baby. Two of the most important things are to get good prenatal care and to follow your health care provider's instructions. Prenatal care is all the medical care you receive before the baby's birth. This care will help prevent, find, and treat any problems during the pregnancy and childbirth. Body changes during your first trimester Your body goes through many changes during pregnancy. The changes vary from woman to woman.  You may gain or lose a couple of pounds at first.  You may feel sick to your stomach (nauseous) and you may throw up (vomit). If the vomiting is uncontrollable, call your health care provider.  You may tire easily.  You may develop headaches that can be relieved by medicines. All medicines should be approved by your health care provider.  You may urinate more often. Painful urination may mean you have a bladder infection.  You may develop heartburn as a result of your pregnancy.  You may develop constipation because certain hormones are causing the muscles that push stool through your intestines to slow down.  You may develop hemorrhoids or swollen veins (varicose veins).  Your breasts may begin to grow larger and become tender. Your nipples may stick out more, and the tissue that surrounds them (areola) may become darker.  Your gums may bleed and may be  sensitive to brushing and flossing.  Dark spots or blotches (chloasma, mask of pregnancy) may develop on your face. This will likely fade after the baby is born.  Your menstrual periods will stop.  You may have a loss of appetite.  You may develop cravings for certain kinds of food.  You may have changes in your emotions from day to day, such as being excited to be pregnant or being concerned that something may go wrong with the pregnancy and baby.  You may have more vivid and strange dreams.  You may have changes in your hair. These can include thickening of your hair, rapid growth, and changes in texture. Some women also have hair loss during or after pregnancy, or hair that feels dry or thin. Your hair will most likely return to normal after your baby is born.  What to expect at prenatal visits During a routine prenatal visit:  You will be weighed to make sure you and the baby are growing normally.  Your blood pressure will be taken.  Your abdomen will be measured to track your baby's growth.  The fetal heartbeat will be listened to between weeks 10 and 14 of your pregnancy.  Test results from any previous visits will be discussed.  Your health care provider may ask you:  How you are feeling.  If you are feeling the baby move.  If you have had any abnormal symptoms, such as leaking fluid, bleeding, severe headaches,   or abdominal cramping.  If you are using any tobacco products, including cigarettes, chewing tobacco, and electronic cigarettes.  If you have any questions.  Other tests that may be performed during your first trimester include:  Blood tests to find your blood type and to check for the presence of any previous infections. The tests will also be used to check for low iron levels (anemia) and protein on red blood cells (Rh antibodies). Depending on your risk factors, or if you previously had diabetes during pregnancy, you may have tests to check for high blood  sugar that affects pregnant women (gestational diabetes).  Urine tests to check for infections, diabetes, or protein in the urine.  An ultrasound to confirm the proper growth and development of the baby.  Fetal screens for spinal cord problems (spina bifida) and Down syndrome.  HIV (human immunodeficiency virus) testing. Routine prenatal testing includes screening for HIV, unless you choose not to have this test.  You may need other tests to make sure you and the baby are doing well.  Follow these instructions at home: Medicines  Follow your health care provider's instructions regarding medicine use. Specific medicines may be either safe or unsafe to take during pregnancy.  Take a prenatal vitamin that contains at least 600 micrograms (mcg) of folic acid.  If you develop constipation, try taking a stool softener if your health care provider approves. Eating and drinking  Eat a balanced diet that includes fresh fruits and vegetables, whole grains, good sources of protein such as meat, eggs, or tofu, and low-fat dairy. Your health care provider will help you determine the amount of weight gain that is right for you.  Avoid raw meat and uncooked cheese. These carry germs that can cause birth defects in the baby.  Eating four or five small meals rather than three large meals a day may help relieve nausea and vomiting. If you start to feel nauseous, eating a few soda crackers can be helpful. Drinking liquids between meals, instead of during meals, also seems to help ease nausea and vomiting.  Limit foods that are high in fat and processed sugars, such as fried and sweet foods.  To prevent constipation: ? Eat foods that are high in fiber, such as fresh fruits and vegetables, whole grains, and beans. ? Drink enough fluid to keep your urine clear or pale yellow. Activity  Exercise only as directed by your health care provider. Most women can continue their usual exercise routine during  pregnancy. Try to exercise for 30 minutes at least 5 days a week. Exercising will help you: ? Control your weight. ? Stay in shape. ? Be prepared for labor and delivery.  Experiencing pain or cramping in the lower abdomen or lower back is a good sign that you should stop exercising. Check with your health care provider before continuing with normal exercises.  Try to avoid standing for long periods of time. Move your legs often if you must stand in one place for a long time.  Avoid heavy lifting.  Wear low-heeled shoes and practice good posture.  You may continue to have sex unless your health care provider tells you not to. Relieving pain and discomfort  Wear a good support bra to relieve breast tenderness.  Take warm sitz baths to soothe any pain or discomfort caused by hemorrhoids. Use hemorrhoid cream if your health care provider approves.  Rest with your legs elevated if you have leg cramps or low back pain.  If you develop   varicose veins in your legs, wear support hose. Elevate your feet for 15 minutes, 3-4 times a day. Limit salt in your diet. Prenatal care  Schedule your prenatal visits by the twelfth week of pregnancy. They are usually scheduled monthly at first, then more often in the last 2 months before delivery.  Write down your questions. Take them to your prenatal visits.  Keep all your prenatal visits as told by your health care provider. This is important. Safety  Wear your seat belt at all times when driving.  Make a list of emergency phone numbers, including numbers for family, friends, the hospital, and police and fire departments. General instructions  Ask your health care provider for a referral to a local prenatal education class. Begin classes no later than the beginning of month 6 of your pregnancy.  Ask for help if you have counseling or nutritional needs during pregnancy. Your health care provider can offer advice or refer you to specialists for help  with various needs.  Do not use hot tubs, steam rooms, or saunas.  Do not douche or use tampons or scented sanitary pads.  Do not cross your legs for long periods of time.  Avoid cat litter boxes and soil used by cats. These carry germs that can cause birth defects in the baby and possibly loss of the fetus by miscarriage or stillbirth.  Avoid all smoking, herbs, alcohol, and medicines not prescribed by your health care provider. Chemicals in these products affect the formation and growth of the baby.  Do not use any products that contain nicotine or tobacco, such as cigarettes and e-cigarettes. If you need help quitting, ask your health care provider. You may receive counseling support and other resources to help you quit.  Schedule a dentist appointment. At home, brush your teeth with a soft toothbrush and be gentle when you floss. Contact a health care provider if:  You have dizziness.  You have mild pelvic cramps, pelvic pressure, or nagging pain in the abdominal area.  You have persistent nausea, vomiting, or diarrhea.  You have a bad smelling vaginal discharge.  You have pain when you urinate.  You notice increased swelling in your face, hands, legs, or ankles.  You are exposed to fifth disease or chickenpox.  You are exposed to German measles (rubella) and have never had it. Get help right away if:  You have a fever.  You are leaking fluid from your vagina.  You have spotting or bleeding from your vagina.  You have severe abdominal cramping or pain.  You have rapid weight gain or loss.  You vomit blood or material that looks like coffee grounds.  You develop a severe headache.  You have shortness of breath.  You have any kind of trauma, such as from a fall or a car accident. Summary  The first trimester of pregnancy is from week 1 until the end of week 13 (months 1 through 3).  Your body goes through many changes during pregnancy. The changes vary from  woman to woman.  You will have routine prenatal visits. During those visits, your health care provider will examine you, discuss any test results you may have, and talk with you about how you are feeling. This information is not intended to replace advice given to you by your health care provider. Make sure you discuss any questions you have with your health care provider. Document Released: 10/03/2001 Document Revised: 09/20/2016 Document Reviewed: 09/20/2016 Elsevier Interactive Patient Education  2017 Elsevier   Inc.  

## 2017-10-10 ENCOUNTER — Other Ambulatory Visit: Payer: Self-pay | Admitting: Obstetrics & Gynecology

## 2017-10-10 DIAGNOSIS — O3680X0 Pregnancy with inconclusive fetal viability, not applicable or unspecified: Secondary | ICD-10-CM

## 2017-10-11 ENCOUNTER — Other Ambulatory Visit: Payer: Self-pay | Admitting: Obstetrics & Gynecology

## 2017-10-11 ENCOUNTER — Ambulatory Visit (INDEPENDENT_AMBULATORY_CARE_PROVIDER_SITE_OTHER): Payer: Medicaid Other

## 2017-10-11 DIAGNOSIS — Z3A08 8 weeks gestation of pregnancy: Secondary | ICD-10-CM | POA: Diagnosis not present

## 2017-10-11 DIAGNOSIS — O3680X0 Pregnancy with inconclusive fetal viability, not applicable or unspecified: Secondary | ICD-10-CM

## 2017-10-11 NOTE — Progress Notes (Signed)
US TA/TV: 7+3 wks,single IUP w/ys,positive fht 148 bpm,normal ovaries bilat,crl 12 mm,EDD 05/27/2018 by [place]

## 2017-10-30 ENCOUNTER — Ambulatory Visit: Payer: Self-pay | Admitting: *Deleted

## 2017-10-30 ENCOUNTER — Encounter: Payer: Self-pay | Admitting: Women's Health

## 2017-11-08 ENCOUNTER — Encounter: Payer: Self-pay | Admitting: Advanced Practice Midwife

## 2017-11-08 ENCOUNTER — Ambulatory Visit: Payer: Self-pay | Admitting: *Deleted

## 2017-11-20 ENCOUNTER — Ambulatory Visit (INDEPENDENT_AMBULATORY_CARE_PROVIDER_SITE_OTHER): Payer: Medicaid Other | Admitting: Advanced Practice Midwife

## 2017-11-20 ENCOUNTER — Other Ambulatory Visit (HOSPITAL_COMMUNITY)
Admission: RE | Admit: 2017-11-20 | Discharge: 2017-11-20 | Disposition: A | Discharge status: Home / Self Care | Payer: Medicaid Other | Source: Ambulatory Visit | Attending: Advanced Practice Midwife | Admitting: Advanced Practice Midwife

## 2017-11-20 ENCOUNTER — Encounter: Payer: Self-pay | Admitting: Advanced Practice Midwife

## 2017-11-20 ENCOUNTER — Ambulatory Visit: Payer: Medicaid Other | Admitting: *Deleted

## 2017-11-20 VITALS — BP 110/60 | HR 108 | Wt 348.8 lb

## 2017-11-20 DIAGNOSIS — Z124 Encounter for screening for malignant neoplasm of cervix: Secondary | ICD-10-CM

## 2017-11-20 DIAGNOSIS — F419 Anxiety disorder, unspecified: Secondary | ICD-10-CM | POA: Diagnosis not present

## 2017-11-20 DIAGNOSIS — Z3A13 13 weeks gestation of pregnancy: Secondary | ICD-10-CM | POA: Diagnosis not present

## 2017-11-20 DIAGNOSIS — Z3402 Encounter for supervision of normal first pregnancy, second trimester: Secondary | ICD-10-CM

## 2017-11-20 DIAGNOSIS — O0990 Supervision of high risk pregnancy, unspecified, unspecified trimester: Secondary | ICD-10-CM | POA: Insufficient documentation

## 2017-11-20 DIAGNOSIS — O99341 Other mental disorders complicating pregnancy, first trimester: Secondary | ICD-10-CM

## 2017-11-20 DIAGNOSIS — F1410 Cocaine abuse, uncomplicated: Secondary | ICD-10-CM | POA: Diagnosis not present

## 2017-11-20 DIAGNOSIS — Z1389 Encounter for screening for other disorder: Secondary | ICD-10-CM

## 2017-11-20 DIAGNOSIS — O99321 Drug use complicating pregnancy, first trimester: Secondary | ICD-10-CM

## 2017-11-20 DIAGNOSIS — Z3401 Encounter for supervision of normal first pregnancy, first trimester: Secondary | ICD-10-CM | POA: Diagnosis not present

## 2017-11-20 DIAGNOSIS — I10 Essential (primary) hypertension: Secondary | ICD-10-CM

## 2017-11-20 DIAGNOSIS — Z331 Pregnant state, incidental: Secondary | ICD-10-CM

## 2017-11-20 DIAGNOSIS — F1910 Other psychoactive substance abuse, uncomplicated: Secondary | ICD-10-CM | POA: Diagnosis not present

## 2017-11-20 LAB — POCT URINALYSIS DIPSTICK
Glucose, UA: NEGATIVE
Ketones, UA: NEGATIVE
LEUKOCYTES UA: NEGATIVE
NITRITE UA: NEGATIVE
PROTEIN UA: NEGATIVE
RBC UA: NEGATIVE

## 2017-11-20 MED ORDER — ASPIRIN EC 81 MG PO TBEC: 81.0000 mg | DELAYED_RELEASE_TABLET | Freq: Every day | ORAL | 6 refills | Status: DC

## 2017-11-20 MED ORDER — PNV PRENATAL PLUS MULTIVITAMIN 27-1 MG PO TABS: 1.0000 | ORAL_TABLET | Freq: Every day | ORAL | 11 refills | Status: DC

## 2017-11-20 NOTE — Progress Notes (Signed)
Subjective:    Hannah Ryan is a G1P0 [redacted]w[redacted]d being seen today for her first obstetrical visit.  Her obstetrical history is significant for past cocaine and THC use.  Says does not have a problem quitting either.  Pregnancy history fully reviewed. She had HTN at one time (on meds) and lost >100# (d/t cocaine) and hasn't been on meds in a while Patient reports no complaints. Has hx dep/anxiety, wants to restart therapy.  FOB has been engaged and trying to get his fiance (not the pt) pregnant for a while and she really wants to tell her--is anxious about this   Vitals:   11/20/17 1424  BP: 110/60  Pulse: (!) 108  Weight: (!) 348 lb 12.8 oz (158.2 kg)    HISTORY: OB History  Gravida Para Term Preterm AB Living  1            SAB TAB Ectopic Multiple Live Births               # Outcome Date GA Lbr Len/2nd Weight Sex Delivery Anes PTL Lv  1 Current              Past Medical History:  Diagnosis Date  . Anxiety    per patient/family  . Chlamydia   . Depression    per patient/family  . Gonorrhea   . Obesity   . Trichomonas infection    Past Surgical History:  Procedure Laterality Date  . ADENOIDECTOMY    . FOOT SURGERY     left  . IRRIGATION AND DEBRIDEMENT ABSCESS  01/08/2012   Procedure: IRRIGATION AND DEBRIDEMENT ABSCESS;  Surgeon: Fabio Bering, MD;  Location: [facility];  Service: General;  Laterality: Right;  . TONSILLECTOMY     Family History  Problem Relation Age of Onset  . Diabetes Maternal Grandmother   . Hypertension Maternal Grandmother   . Hypertension Father   . Hypertension Mother   . Diabetes Mother   . Autism Brother   . Lupus Paternal Aunt   . Lupus Cousin      Exam       Pelvic Exam:    Perineum: Normal Perineum   Vulva: normal   Vagina:  normal mucosa, normal discharge, no palpable nodules   Uterus Normal, Gravid, FH: 13     Cervix: normal   Adnexa: Not palpable   Urinary:  urethral meatus normal    System:     Skin: normal  coloration and turgor, no rashes    Neurologic: oriented, normal, normal mood   Extremities: normal strength, tone, and muscle mass   HEENT PERRLA   Mouth/Teeth mucous membranes moist, normal dentition   Neck supple and no masses   Cardiovascular: regular rate and rhythm   Respiratory:  appears well, vitals normal, no respiratory distress, acyanotic   Abdomen: soft, non-tender;  FHR: 150 [place]        The nature of [facility] with multiple MDs and other Advanced Practice Providers was explained to patient; also emphasized that residents, students are part of our team.  Assessment:    Pregnancy: G1P0 Patient Active Problem List   Diagnosis Date Noted  . Supervision of normal first pregnancy 11/20/2017  . Polysubstance abuse (HCC) 11/20/2017  . Encounter to determine fetal viability of pregnancy 10/04/2017  . Less than [redacted] weeks gestation of pregnancy 10/04/2017  . Positive pregnancy test 10/04/2017  . Cellulitis and abscess of foot 01/05/2012  . Benign hypertension 01/05/2012  .  Morbid obesity (HCC) 01/05/2012        Plan:     Initial labs drawn. Continue prenatal vitamins  Problem list reviewed and updated  Plans to restart therapy w/[facility]. [facility].  Reviewed n/v relief measures and warning s/s to report  Reviewed recommended weight gain based on pre-gravid BMI  Encouraged well-balanced diet Genetic Screening discussed Integrated Screen: requested.  Ultrasound discussed; fetal survey: requested.  No Follow-up on file.  CRESENZO-DISHMAN,Corrado Hymon 11/21/2017

## 2017-11-21 ENCOUNTER — Other Ambulatory Visit: Payer: Self-pay | Admitting: Advanced Practice Midwife

## 2017-11-21 ENCOUNTER — Other Ambulatory Visit: Payer: Medicaid Other

## 2017-11-21 ENCOUNTER — Ambulatory Visit (INDEPENDENT_AMBULATORY_CARE_PROVIDER_SITE_OTHER): Payer: Medicaid Other

## 2017-11-21 ENCOUNTER — Other Ambulatory Visit (HOSPITAL_COMMUNITY): Payer: Self-pay | Admitting: Advanced Practice Midwife

## 2017-11-21 DIAGNOSIS — Z3A13 13 weeks gestation of pregnancy: Secondary | ICD-10-CM | POA: Diagnosis not present

## 2017-11-21 DIAGNOSIS — Z3682 Encounter for antenatal screening for nuchal translucency: Secondary | ICD-10-CM

## 2017-11-21 DIAGNOSIS — Z3402 Encounter for supervision of normal first pregnancy, second trimester: Secondary | ICD-10-CM | POA: Diagnosis not present

## 2017-11-21 DIAGNOSIS — F1910 Other psychoactive substance abuse, uncomplicated: Secondary | ICD-10-CM

## 2017-11-21 NOTE — Progress Notes (Signed)
US 13+2 wks,crl 76.19 mm,NB present,NT 1.7 mm,posterior pl gr 0,normal ovaries bilat,fhr 146 bpm

## 2017-11-22 ENCOUNTER — Encounter: Payer: Self-pay | Admitting: Advanced Practice Midwife

## 2017-11-22 LAB — MED LIST OPTION NOT SELECTED

## 2017-11-22 LAB — OBSTETRIC PANEL, INCLUDING HIV
Antibody Screen: NEGATIVE
Basophils Absolute: 0 10*3/uL (ref 0.0–0.2)
Basos: 0 %
EOS (ABSOLUTE): 0.2 10*3/uL (ref 0.0–0.4)
EOS: 3 %
HEMATOCRIT: 37.7 % (ref 34.0–46.6)
HEMOGLOBIN: 12.3 g/dL (ref 11.1–15.9)
HEP B S AG: NEGATIVE
HIV SCREEN 4TH GENERATION: NONREACTIVE
Immature Grans (Abs): 0 10*3/uL (ref 0.0–0.1)
Immature Granulocytes: 0 %
LYMPHS ABS: 3 10*3/uL (ref 0.7–3.1)
Lymphs: 45 %
MCH: 27.5 pg (ref 26.6–33.0)
MCHC: 32.6 g/dL (ref 31.5–35.7)
MCV: 84 fL (ref 79–97)
MONOS ABS: 0.5 10*3/uL (ref 0.1–0.9)
Monocytes: 7 %
Neutrophils Absolute: 3 10*3/uL (ref 1.4–7.0)
Neutrophils: 45 %
Platelets: 238 10*3/uL (ref 150–379)
RBC: 4.48 x10E6/uL (ref 3.77–5.28)
RDW: 14.4 % (ref 12.3–15.4)
RPR: NONREACTIVE
Rh Factor: POSITIVE
Rubella Antibodies, IGG: 3.06 index (ref >0.99)
WBC: 6.8 10*3/uL (ref 3.4–10.8)

## 2017-11-22 LAB — URINALYSIS, ROUTINE W REFLEX MICROSCOPIC
Bilirubin, UA: NEGATIVE
GLUCOSE, UA: NEGATIVE
Ketones, UA: NEGATIVE
Nitrite, UA: NEGATIVE
PROTEIN UA: NEGATIVE
RBC, UA: NEGATIVE
Specific Gravity, UA: 1.02 (ref 1.005–1.030)
Urobilinogen, Ur: 0.2 mg/dL (ref 0.2–1.0)
pH, UA: 6 (ref 5.0–7.5)

## 2017-11-22 LAB — PMP SCREEN PROFILE (10S), URINE
AMPHETAMINE SCREEN URINE: NEGATIVE ng/mL
BARBITURATE SCREEN URINE: NEGATIVE ng/mL
BENZODIAZEPINE SCREEN, URINE: NEGATIVE ng/mL
CANNABINOIDS UR QL SCN: POSITIVE ng/mL — AB
CREATININE(CRT), U: 215.2 mg/dL (ref 20.0–300.0)
Cocaine (Metab) Scrn, Ur: POSITIVE ng/mL — AB
METHADONE SCREEN, URINE: NEGATIVE ng/mL
OXYCODONE+OXYMORPHONE UR QL SCN: POSITIVE ng/mL — AB
Opiate Scrn, Ur: POSITIVE ng/mL — AB
PH UR, DRUG SCRN: 5.7 (ref 4.5–8.9)
PHENCYCLIDINE QUANTITATIVE URINE: NEGATIVE ng/mL
Propoxyphene Scrn, Ur: NEGATIVE ng/mL

## 2017-11-22 LAB — MICROSCOPIC EXAMINATION
BACTERIA UA: NONE SEEN
Casts: NONE SEEN /lpf

## 2017-11-22 LAB — CYTOLOGY - PAP
Chlamydia: NEGATIVE
Diagnosis: NEGATIVE
Neisseria Gonorrhea: NEGATIVE

## 2017-11-22 LAB — SICKLE CELL SCREEN: SICKLE CELL SCREEN: NEGATIVE

## 2017-11-23 LAB — URINE CULTURE

## 2017-11-27 LAB — INTEGRATED 1
Crown Rump Length: 76.2 mm
GEST. AGE ON COLLECTION DATE: 13.4 wk
Maternal Age at EDD: 27.1 yr
NUCHAL TRANSLUCENCY (NT): 1.7 mm
NUMBER OF FETUSES: 1
PAPP-A VALUE: 1263.4 ng/mL
Weight: 348 [lb_av]

## 2017-11-28 LAB — CYSTIC FIBROSIS MUTATION 97: Interpretation: NOT DETECTED

## 2017-12-11 ENCOUNTER — Encounter: Payer: Self-pay | Admitting: Obstetrics & Gynecology

## 2017-12-11 ENCOUNTER — Ambulatory Visit (INDEPENDENT_AMBULATORY_CARE_PROVIDER_SITE_OTHER): Payer: Medicaid Other | Admitting: Obstetrics & Gynecology

## 2017-12-11 VITALS — BP 110/70 | HR 98 | Wt 361.4 lb

## 2017-12-11 DIAGNOSIS — Z3402 Encounter for supervision of normal first pregnancy, second trimester: Secondary | ICD-10-CM

## 2017-12-11 DIAGNOSIS — Z1389 Encounter for screening for other disorder: Secondary | ICD-10-CM

## 2017-12-11 DIAGNOSIS — Z1379 Encounter for other screening for genetic and chromosomal anomalies: Secondary | ICD-10-CM

## 2017-12-11 DIAGNOSIS — Z3A16 16 weeks gestation of pregnancy: Secondary | ICD-10-CM

## 2017-12-11 DIAGNOSIS — Z331 Pregnant state, incidental: Secondary | ICD-10-CM

## 2017-12-11 LAB — POCT URINALYSIS DIPSTICK
Blood, UA: NEGATIVE
GLUCOSE UA: NEGATIVE
Ketones, UA: NEGATIVE
Leukocytes, UA: NEGATIVE
NITRITE UA: NEGATIVE
PROTEIN UA: NEGATIVE

## 2017-12-11 NOTE — Progress Notes (Signed)
G1P0 8742w1d Estimated Date of Delivery: 05/27/18  Blood pressure 110/70, pulse 98, weight (!) 361 lb 6.4 oz (163.9 kg), last menstrual period 08/13/2017.   BP weight and urine results all reviewed and noted.  Please refer to the obstetrical flow sheet for the fundal height and fetal heart rate documentation:  Patient reports good fetal movement, denies any bleeding and no rupture of membranes symptoms or regular contractions. Patient is without complaints. All questions were answered.  Orders Placed This Encounter  Procedures  . US OB Comp + 14 Wk  . INTEGRATED 2  . POCT urinalysis dipstick    Plan:  Continued routine obstetrical care, 2nd IT today  Return in about 4 weeks (around 01/08/2018) for 20 week sono, LROB.

## 2017-12-14 LAB — INTEGRATED 2
AFP MARKER: 23.6 ng/mL
AFP MOM: 1.61
CROWN RUMP LENGTH: 76.2 mm
DIA MoM: 1.71
DIA Value: 187.3 pg/mL
ESTRIOL UNCONJUGATED: 0.47 ng/mL
GEST. AGE ON COLLECTION DATE: 13.4 wk
GESTATIONAL AGE: 16.3 wk
HCG MOM: 2.14
Maternal Age at EDD: 27.1 yr
Nuchal Translucency (NT): 1.7 mm
Nuchal Translucency MoM: 0.91
Number of Fetuses: 1
PAPP-A MOM: 2.81
PAPP-A Value: 1263.4 ng/mL
Test Results:: NEGATIVE
WEIGHT: 348 [lb_av]
Weight: 348 [lb_av]
hCG Value: 31.9 IU/mL
uE3 MoM: 0.77

## 2018-01-07 ENCOUNTER — Other Ambulatory Visit: Payer: Self-pay | Admitting: Obstetrics & Gynecology

## 2018-01-07 DIAGNOSIS — Z3402 Encounter for supervision of normal first pregnancy, second trimester: Secondary | ICD-10-CM

## 2018-01-07 DIAGNOSIS — Z363 Encounter for antenatal screening for malformations: Secondary | ICD-10-CM

## 2018-01-08 ENCOUNTER — Encounter: Payer: Self-pay | Admitting: Advanced Practice Midwife

## 2018-01-08 ENCOUNTER — Ambulatory Visit (INDEPENDENT_AMBULATORY_CARE_PROVIDER_SITE_OTHER): Payer: Medicaid Other

## 2018-01-08 ENCOUNTER — Other Ambulatory Visit: Payer: Self-pay

## 2018-01-08 ENCOUNTER — Ambulatory Visit (INDEPENDENT_AMBULATORY_CARE_PROVIDER_SITE_OTHER): Payer: Medicaid Other | Admitting: Advanced Practice Midwife

## 2018-01-08 VITALS — BP 128/86 | HR 88 | Wt 353.0 lb

## 2018-01-08 DIAGNOSIS — F1910 Other psychoactive substance abuse, uncomplicated: Secondary | ICD-10-CM

## 2018-01-08 DIAGNOSIS — O0992 Supervision of high risk pregnancy, unspecified, second trimester: Secondary | ICD-10-CM

## 2018-01-08 DIAGNOSIS — I10 Essential (primary) hypertension: Secondary | ICD-10-CM

## 2018-01-08 DIAGNOSIS — IMO0002 Reserved for concepts with insufficient information to code with codable children: Secondary | ICD-10-CM

## 2018-01-08 DIAGNOSIS — Z3402 Encounter for supervision of normal first pregnancy, second trimester: Secondary | ICD-10-CM

## 2018-01-08 DIAGNOSIS — Z363 Encounter for antenatal screening for malformations: Secondary | ICD-10-CM | POA: Diagnosis not present

## 2018-01-08 DIAGNOSIS — Z1389 Encounter for screening for other disorder: Secondary | ICD-10-CM

## 2018-01-08 DIAGNOSIS — Z0489 Encounter for examination and observation for other specified reasons: Secondary | ICD-10-CM

## 2018-01-08 DIAGNOSIS — O99322 Drug use complicating pregnancy, second trimester: Secondary | ICD-10-CM

## 2018-01-08 DIAGNOSIS — Z3A20 20 weeks gestation of pregnancy: Secondary | ICD-10-CM

## 2018-01-08 DIAGNOSIS — Z331 Pregnant state, incidental: Secondary | ICD-10-CM

## 2018-01-08 DIAGNOSIS — Z23 Encounter for immunization: Secondary | ICD-10-CM

## 2018-01-08 LAB — POCT URINALYSIS DIPSTICK
Blood, UA: NEGATIVE
GLUCOSE UA: NEGATIVE
Ketones, UA: NEGATIVE
Nitrite, UA: NEGATIVE
Protein, UA: NEGATIVE

## 2018-01-08 NOTE — Progress Notes (Signed)
LOW-RISK PREGNANCY VISIT Patient name: Hannah Ryan MRN [id number]  Date of birth: 1991-04-17 Chief Complaint:   Routine Prenatal Visit (u/s today)  History of Present Illness:   Hannah Ryan is a 27 y.o. G1P0 female at [redacted]w[redacted]d with an Estimated Date of Delivery: 05/27/18 being seen today for ongoing management of a low-risk pregnancy.  Today she reports no complaints.  . Vag. Bleeding: None.   . denies leaking of fluid. Review of Systems:   Pertinent items are noted in HPI Denies abnormal vaginal discharge w/ itching/odor/irritation, headaches, visual changes, shortness of breath, chest pain, abdominal pain, severe nausea/vomiting, or problems with urination or bowel movements unless otherwise stated above.  Pertinent History Reviewed:  Medical & Surgical Hx:   Past Medical History:  Diagnosis Date  . Anxiety    per patient/family  . Chlamydia   . Depression    per patient/family  . Gonorrhea   . Obesity   . Trichomonas infection    Past Surgical History:  Procedure Laterality Date  . ADENOIDECTOMY    . FOOT SURGERY     left  . IRRIGATION AND DEBRIDEMENT ABSCESS  01/08/2012   Procedure: IRRIGATION AND DEBRIDEMENT ABSCESS;  Surgeon: Fabio Bering, MD;  Location: [facility];  Service: General;  Laterality: Right;  . TONSILLECTOMY     Family History  Problem Relation Age of Onset  . Diabetes Maternal Grandmother   . Hypertension Maternal Grandmother   . Hypertension Father   . Hypertension Mother   . Diabetes Mother   . Autism Brother   . Lupus Paternal Aunt   . Lupus Cousin     Current Outpatient Medications:  .  aspirin EC 81 MG tablet, Take 1 tablet (81 mg total) by mouth daily., Disp: 30 tablet, Rfl: 6 .  HYDROcodone-acetaminophen (NORCO/VICODIN) 5-325 MG tablet, Take 1 tablet by mouth every 6 (six) hours as needed for moderate pain., Disp: , Rfl:  .  Prenatal Vit-Fe Fumarate-FA (PNV PRENATAL PLUS MULTIVITAMIN) 27-1 MG TABS, Take 1 tablet by mouth daily., Disp:  30 tablet, Rfl: 11 .  promethazine (PHENERGAN) 25 MG tablet, Take 1 tablet (25 mg total) by mouth every 6 (six) hours as needed for nausea or vomiting., Disp: 30 tablet, Rfl: 1 Social History: Reviewed -  reports that she has been smoking cigarettes.  She has a 0.75 pack-year smoking history. she has never used smokeless tobacco.  Physical Assessment:   Vitals:   01/08/18 1649  BP: 128/86  Pulse: 88  Weight: (!) 353 lb (160.1 kg)  Body mass index is 52.13 kg/m.        Physical Examination:   General appearance: Well appearing, and in no distress  Mental status: Alert, oriented to person, place, and time  Skin: Warm & dry  Cardiovascular: Normal heart rate noted  Respiratory: Normal respiratory effort, no distress  Abdomen: Soft, gravid, nontender  Pelvic: Cervical exam deferred         Extremities: Edema: Trace  Fetal Status: Fetal Heart Rate (bpm): 140        Results for orders placed or performed in visit on 01/08/18 (from the past 24 hour(s))  POCT urinalysis dipstick   Collection Time: 01/08/18  4:51 PM  Result Value Ref Range   Color, UA     Clarity, UA     Glucose, UA neg    Bilirubin, UA     Ketones, UA neg    Spec Grav, UA  1.010 - 1.025   Blood,  UA neg    pH, UA  5.0 - 8.0   Protein, UA neg    Urobilinogen, UA  0.2 or 1.0 E.U./dL   Nitrite, UA neg    Leukocytes, UA Small (1+) (A) Negative   Appearance     Odor      Assessment & Plan:  1) High risk pregnancy G1P0 at 7848w1d with an Estimated Date of Delivery: 05/27/18   2) Polysubstance abuse, , denies current use 3: Hx Htn, lost 100#, BP normal now. Continue ASA 81 mg   Labs/procedures/US today: US 20+1 wks,cephalic,cx 3.8 cm,posterior pl gr 0,normal ovaries bilat,svp of fluid 4.8 cm,fhr 140 bpm,efw 384 g 83%,limited view of heart and face because of fetal position and body habitus,no obvious abnormalities     Plan:  Continue routine obstetrical care  Flu shot today  Follow-up: Return in about 4 weeks  (around 02/05/2018) for LROB, US:OB F/U: anatomy not seen.  Orders Placed This Encounter  Procedures  . Pain Management Screening Profile (10S)  . POCT urinalysis dipstick   Jacklyn ShellFrances Cresenzo-Dishmon CNM 01/08/2018 5:01 PM

## 2018-01-08 NOTE — Patient Instructions (Signed)
Hannah Ryan, I greatly value your feedback.  If you receive a survey following your visit with us today, we appreciate you taking the time to fill it out.  Thanks, Hannah Ryan, CNM     Second Trimester of Pregnancy The second trimester is from week 14 through week 27 (months 4 through 6). The second trimester is often a time when you feel your best. Your body has adjusted to being pregnant, and you begin to feel better physically. Usually, morning sickness has lessened or quit completely, you may have more energy, and you may have an increase in appetite. The second trimester is also a time when the fetus is growing rapidly. At the end of the sixth month, the fetus is about 9 inches long and weighs about 1 pounds. You will likely begin to feel the baby move (quickening) between 16 and 20 weeks of pregnancy. Body changes during your second trimester Your body continues to go through many changes during your second trimester. The changes vary from woman to woman.  Your weight will continue to increase. You will notice your lower abdomen bulging out.  You may begin to get stretch marks on your hips, abdomen, and breasts.  You may develop headaches that can be relieved by medicines. The medicines should be approved by your health care provider.  You may urinate more often because the fetus is pressing on your bladder.  You may develop or continue to have heartburn as a result of your pregnancy.  You may develop constipation because certain hormones are causing the muscles that push waste through your intestines to slow down.  You may develop hemorrhoids or swollen, bulging veins (varicose veins).  You may have back pain. This is caused by: ? Weight gain. ? Pregnancy hormones that are relaxing the joints in your pelvis. ? A shift in weight and the muscles that support your balance.  Your breasts will continue to grow and they will continue to become tender.  Your gums may  bleed and may be sensitive to brushing and flossing.  Dark spots or blotches (chloasma, mask of pregnancy) may develop on your face. This will likely fade after the baby is born.  A dark line from your belly button to the pubic area (linea nigra) may appear. This will likely fade after the baby is born.  You may have changes in your hair. These can include thickening of your hair, rapid growth, and changes in texture. Some women also have hair loss during or after pregnancy, or hair that feels dry or thin. Your hair will most likely return to normal after your baby is born.  What to expect at prenatal visits During a routine prenatal visit:  You will be weighed to make sure you and the fetus are growing normally.  Your blood pressure will be taken.  Your abdomen will be measured to track your baby's growth.  The fetal heartbeat will be listened to.  Any test results from the previous visit will be discussed.  Your health care provider may ask you:  How you are feeling.  If you are feeling the baby move.  If you have had any abnormal symptoms, such as leaking fluid, bleeding, severe headaches, or abdominal cramping.  If you are using any tobacco products, including cigarettes, chewing tobacco, and electronic cigarettes.  If you have any questions.  Other tests that may be performed during your second trimester include:  Blood tests that check for: ? Low iron levels (anemia). ?  High blood sugar that affects pregnant women (gestational diabetes) between 20 and 28 weeks. ? Rh antibodies. This is to check for a protein on red blood cells (Rh factor).  Urine tests to check for infections, diabetes, or protein in the urine.  An ultrasound to confirm the proper growth and development of the baby.  An amniocentesis to check for possible genetic problems.  Fetal screens for spina bifida and Down syndrome.  HIV (human immunodeficiency virus) testing. Routine prenatal testing  includes screening for HIV, unless you choose not to have this test.  Follow these instructions at home: Medicines  Follow your health care provider's instructions regarding medicine use. Specific medicines may be either safe or unsafe to take during pregnancy.  Take a prenatal vitamin that contains at least 600 micrograms (mcg) of folic acid.  If you develop constipation, try taking a stool softener if your health care provider approves. Eating and drinking  Eat a balanced diet that includes fresh fruits and vegetables, whole grains, good sources of protein such as meat, eggs, or tofu, and low-fat dairy. Your health care provider will help you determine the amount of weight gain that is right for you.  Avoid raw meat and uncooked cheese. These carry germs that can cause birth defects in the baby.  If you have low calcium intake from food, talk to your health care provider about whether you should take a daily calcium supplement.  Limit foods that are high in fat and processed sugars, such as fried and sweet foods.  To prevent constipation: ? Drink enough fluid to keep your urine clear or pale yellow. ? Eat foods that are high in fiber, such as fresh fruits and vegetables, whole grains, and beans. Activity  Exercise only as directed by your health care provider. Most women can continue their usual exercise routine during pregnancy. Try to exercise for 30 minutes at least 5 days a week. Stop exercising if you experience uterine contractions.  Avoid heavy lifting, wear low heel shoes, and practice good posture.  A sexual relationship may be continued unless your health care provider directs you otherwise. Relieving pain and discomfort  Wear a good support bra to prevent discomfort from breast tenderness.  Take warm sitz baths to soothe any pain or discomfort caused by hemorrhoids. Use hemorrhoid cream if your health care provider approves.  Rest with your legs elevated if you have  leg cramps or low back pain.  If you develop varicose veins, wear support hose. Elevate your feet for 15 minutes, 3-4 times a day. Limit salt in your diet. Prenatal Care  Write down your questions. Take them to your prenatal visits.  Keep all your prenatal visits as told by your health care provider. This is important. Safety  Wear your seat belt at all times when driving.  Make a list of emergency phone numbers, including numbers for family, friends, the hospital, and police and fire departments. General instructions  Ask your health care provider for a referral to a local prenatal education class. Begin classes no later than the beginning of month 6 of your pregnancy.  Ask for help if you have counseling or nutritional needs during pregnancy. Your health care provider can offer advice or refer you to specialists for help with various needs.  Do not use hot tubs, steam rooms, or saunas.  Do not douche or use tampons or scented sanitary pads.  Do not cross your legs for long periods of time.  Avoid cat litter boxes and  soil used by cats. These carry germs that can cause birth defects in the baby and possibly loss of the fetus by miscarriage or stillbirth.  Avoid all smoking, herbs, alcohol, and unprescribed drugs. Chemicals in these products can affect the formation and growth of the baby.  Do not use any products that contain nicotine or tobacco, such as cigarettes and e-cigarettes. If you need help quitting, ask your health care provider.  Visit your dentist if you have not gone yet during your pregnancy. Use a soft toothbrush to brush your teeth and be gentle when you floss. Contact a health care provider if:  You have dizziness.  You have mild pelvic cramps, pelvic pressure, or nagging pain in the abdominal area.  You have persistent nausea, vomiting, or diarrhea.  You have a bad smelling vaginal discharge.  You have pain when you urinate. Get help right away if:  You  have a fever.  You are leaking fluid from your vagina.  You have spotting or bleeding from your vagina.  You have severe abdominal cramping or pain.  You have rapid weight gain or weight loss.  You have shortness of breath with chest pain.  You notice sudden or extreme swelling of your face, hands, ankles, feet, or legs.  You have not felt your baby move in over an hour.  You have severe headaches that do not go away when you take medicine.  You have vision changes. Summary  The second trimester is from week 14 through week 27 (months 4 through 6). It is also a time when the fetus is growing rapidly.  Your body goes through many changes during pregnancy. The changes vary from woman to woman.  Avoid all smoking, herbs, alcohol, and unprescribed drugs. These chemicals affect the formation and growth your baby.  Do not use any tobacco products, such as cigarettes, chewing tobacco, and e-cigarettes. If you need help quitting, ask your health care provider.  Contact your health care provider if you have any questions. Keep all prenatal visits as told by your health care provider. This is important. This information is not intended to replace advice given to you by your health care provider. Make sure you discuss any questions you have with your health care provider.      CHILDBIRTH CLASSES [phone] is the phone number for Pregnancy Classes or hospital tours at [place] will be referred to  [email or web address] for more information on childbirth classes  At this site you may register for classes. You may sign up for a waiting list if classes are full. Please SIGN UP FOR THIS!.   When the waiting list becomes long, sometimes new classes can be added.

## 2018-01-08 NOTE — Progress Notes (Signed)
US 20+1 wks,cephalic,cx 3.8 cm,posterior pl gr 0,normal ovaries bilat,svp of fluid 4.8 cm,fhr 140 bpm,efw 384 g 83%,limited view of heart and face because of fetal position and body habitus,no obvious abnormalities

## 2018-01-09 ENCOUNTER — Encounter: Payer: Self-pay | Admitting: Advanced Practice Midwife

## 2018-01-09 LAB — PMP SCREEN PROFILE (10S), URINE
Amphetamine Scrn, Ur: NEGATIVE ng/mL
BARBITURATE SCREEN URINE: NEGATIVE ng/mL
BENZODIAZEPINE SCREEN, URINE: NEGATIVE ng/mL
CANNABINOIDS UR QL SCN: POSITIVE ng/mL — AB
CREATININE(CRT), U: 170.6 mg/dL (ref 20.0–300.0)
Cocaine (Metab) Scrn, Ur: POSITIVE ng/mL — AB
Methadone Screen, Urine: NEGATIVE ng/mL
OPIATE SCREEN URINE: POSITIVE ng/mL — AB
OXYCODONE+OXYMORPHONE UR QL SCN: POSITIVE ng/mL — AB
Ph of Urine: 5.9 (ref 4.5–8.9)
Phencyclidine Qn, Ur: NEGATIVE ng/mL
Propoxyphene Scrn, Ur: NEGATIVE ng/mL

## 2018-01-09 LAB — MED LIST OPTION NOT SELECTED

## 2018-02-04 ENCOUNTER — Other Ambulatory Visit (HOSPITAL_COMMUNITY): Payer: Self-pay | Admitting: Advanced Practice Midwife

## 2018-02-04 ENCOUNTER — Other Ambulatory Visit: Payer: Self-pay | Admitting: Advanced Practice Midwife

## 2018-02-04 DIAGNOSIS — IMO0002 Reserved for concepts with insufficient information to code with codable children: Secondary | ICD-10-CM

## 2018-02-04 DIAGNOSIS — Z0489 Encounter for examination and observation for other specified reasons: Secondary | ICD-10-CM

## 2018-02-05 ENCOUNTER — Encounter: Payer: Self-pay | Admitting: Women's Health

## 2018-02-05 ENCOUNTER — Ambulatory Visit (INDEPENDENT_AMBULATORY_CARE_PROVIDER_SITE_OTHER): Payer: Medicaid Other

## 2018-02-05 ENCOUNTER — Ambulatory Visit (INDEPENDENT_AMBULATORY_CARE_PROVIDER_SITE_OTHER): Payer: Medicaid Other | Admitting: Women's Health

## 2018-02-05 VITALS — BP 152/60 | HR 101 | Wt 358.0 lb

## 2018-02-05 DIAGNOSIS — O99322 Drug use complicating pregnancy, second trimester: Secondary | ICD-10-CM

## 2018-02-05 DIAGNOSIS — Z1389 Encounter for screening for other disorder: Secondary | ICD-10-CM

## 2018-02-05 DIAGNOSIS — F1210 Cannabis abuse, uncomplicated: Secondary | ICD-10-CM | POA: Diagnosis not present

## 2018-02-05 DIAGNOSIS — R030 Elevated blood-pressure reading, without diagnosis of hypertension: Secondary | ICD-10-CM

## 2018-02-05 DIAGNOSIS — Z3402 Encounter for supervision of normal first pregnancy, second trimester: Secondary | ICD-10-CM

## 2018-02-05 DIAGNOSIS — Z3A24 24 weeks gestation of pregnancy: Secondary | ICD-10-CM

## 2018-02-05 DIAGNOSIS — O0992 Supervision of high risk pregnancy, unspecified, second trimester: Secondary | ICD-10-CM

## 2018-02-05 DIAGNOSIS — Z331 Pregnant state, incidental: Secondary | ICD-10-CM

## 2018-02-05 DIAGNOSIS — F1910 Other psychoactive substance abuse, uncomplicated: Secondary | ICD-10-CM

## 2018-02-05 DIAGNOSIS — IMO0002 Reserved for concepts with insufficient information to code with codable children: Secondary | ICD-10-CM

## 2018-02-05 DIAGNOSIS — O10912 Unspecified pre-existing hypertension complicating pregnancy, second trimester: Secondary | ICD-10-CM | POA: Diagnosis not present

## 2018-02-05 DIAGNOSIS — O10919 Unspecified pre-existing hypertension complicating pregnancy, unspecified trimester: Secondary | ICD-10-CM

## 2018-02-05 DIAGNOSIS — Z0489 Encounter for examination and observation for other specified reasons: Secondary | ICD-10-CM

## 2018-02-05 DIAGNOSIS — O0990 Supervision of high risk pregnancy, unspecified, unspecified trimester: Secondary | ICD-10-CM

## 2018-02-05 DIAGNOSIS — O99320 Drug use complicating pregnancy, unspecified trimester: Secondary | ICD-10-CM

## 2018-02-05 LAB — POCT URINALYSIS DIPSTICK
Blood, UA: NEGATIVE
Glucose, UA: NEGATIVE
Ketones, UA: NEGATIVE
NITRITE UA: NEGATIVE
PROTEIN UA: NEGATIVE

## 2018-02-05 NOTE — Progress Notes (Signed)
US 24+1 wks,breech,cx 3.4 cm,posterior pl gr 0,normal ovaries bilat,fhr 150 bpm,svp of fluid 5.7 cm,EFW 739 g 72%,anatomy of the face and heart are complete,no obvious abnormalities

## 2018-02-05 NOTE — Patient Instructions (Addendum)
Hannah Ryan, I greatly value your feedback.  If you receive a survey following your visit with [place] today, we appreciate you taking the time to fill it out.  Thanks, [name], CNM, [facility]  [facility] [phone] Dr. Cathey Endow [phone]    Call the office [phone]) or go to [facility] if:  You begin to have strong, frequent contractions  Your water breaks.  Sometimes it is a big gush of fluid, sometimes it is just a trickle that keeps getting your panties wet or running down your legs  You have vaginal bleeding.  It is normal to have a small amount of spotting if your cervix was checked.   You don't feel your baby moving like normal.  If you don't, get you something to eat and drink and lay down and focus on feeling your baby move.   If your baby is still not moving like normal, you should call the office or go to [facility].  Second Trimester of Pregnancy The second trimester is from week 13 through week 28, months 4 through 6. The second trimester is often a time when you feel your best. Your body has also adjusted to being pregnant, and you begin to feel better physically. Usually, morning sickness has lessened or quit completely, you may have more energy, and you may have an increase in appetite. The second trimester is also a time when the fetus is growing rapidly. At the end of the sixth month, the fetus is about 9 inches long and weighs about 1 pounds. You will likely begin to feel the baby move (quickening) between 18 and 20 weeks of the pregnancy. BODY CHANGES Your body goes through many changes during pregnancy. The changes vary from woman to woman.   Your weight will continue to increase. You will notice your lower abdomen bulging out.  You may begin to get stretch marks on your hips, abdomen, and breasts.  You may develop headaches that can be relieved by medicines approved by your health care provider.  You may urinate more often because  the fetus is pressing on your bladder.  You may develop or continue to have heartburn as a result of your pregnancy.  You may develop constipation because certain hormones are causing the muscles that push waste through your intestines to slow down.  You may develop hemorrhoids or swollen, bulging veins (varicose veins).  You may have back pain because of the weight gain and pregnancy hormones relaxing your joints between the bones in your pelvis and as a result of a shift in weight and the muscles that support your balance.  Your breasts will continue to grow and be tender.  Your gums may bleed and may be sensitive to brushing and flossing.  Dark spots or blotches (chloasma, mask of pregnancy) may develop on your face. This will likely fade after the baby is born.  A dark line from your belly button to the pubic area (linea nigra) may appear. This will likely fade after the baby is born.  You may have changes in your hair. These can include thickening of your hair, rapid growth, and changes in texture. Some women also have hair loss during or after pregnancy, or hair that feels dry or thin. Your hair will most likely return to normal after your baby is born. WHAT TO EXPECT AT YOUR PRENATAL VISITS During a routine prenatal visit:  You will be weighed to make sure you and the fetus are growing normally.  Your blood pressure will be taken.  Your abdomen will be measured to track your baby's growth.  The fetal heartbeat will be listened to.  Any test results from the previous visit will be discussed. Your health care provider may ask you:  How you are feeling.  If you are feeling the baby move.  If you have had any abnormal symptoms, such as leaking fluid, bleeding, severe headaches, or abdominal cramping.  If you have any questions. Other tests that may be performed during your second trimester include:  Blood tests that check for:  Low iron levels (anemia).  Gestational  diabetes (between 24 and 28 weeks).  Rh antibodies.  Urine tests to check for infections, diabetes, or protein in the urine.  An ultrasound to confirm the proper growth and development of the baby.  An amniocentesis to check for possible genetic problems.  Fetal screens for spina bifida and Down syndrome. HOME CARE INSTRUCTIONS   Avoid all smoking, herbs, alcohol, and unprescribed drugs. These chemicals affect the formation and growth of the baby.  Follow your health care provider's instructions regarding medicine use. There are medicines that are either safe or unsafe to take during pregnancy.  Exercise only as directed by your health care provider. Experiencing uterine cramps is a good sign to stop exercising.  Continue to eat regular, healthy meals.  Wear a good support bra for breast tenderness.  Do not use hot tubs, steam rooms, or saunas.  Wear your seat belt at all times when driving.  Avoid raw meat, uncooked cheese, cat litter boxes, and soil used by cats. These carry germs that can cause birth defects in the baby.  Take your prenatal vitamins.  Try taking a stool softener (if your health care provider approves) if you develop constipation. Eat more high-fiber foods, such as fresh vegetables or fruit and whole grains. Drink plenty of fluids to keep your urine clear or pale yellow.  Take warm sitz baths to soothe any pain or discomfort caused by hemorrhoids. Use hemorrhoid cream if your health care provider approves.  If you develop varicose veins, wear support hose. Elevate your feet for 15 minutes, 3-4 times a day. Limit salt in your diet.  Avoid heavy lifting, wear low heel shoes, and practice good posture.  Rest with your legs elevated if you have leg cramps or low back pain.  Visit your dentist if you have not gone yet during your pregnancy. Use a soft toothbrush to brush your teeth and be gentle when you floss.  A sexual relationship may be continued unless  your health care provider directs you otherwise.  Continue to go to all your prenatal visits as directed by your health care provider. SEEK MEDICAL CARE IF:   You have dizziness.  You have mild pelvic cramps, pelvic pressure, or nagging pain in the abdominal area.  You have persistent nausea, vomiting, or diarrhea.  You have a bad smelling vaginal discharge.  You have pain with urination. SEEK IMMEDIATE MEDICAL CARE IF:   You have a fever.  You are leaking fluid from your vagina.  You have spotting or bleeding from your vagina.  You have severe abdominal cramping or pain.  You have rapid weight gain or loss.  You have shortness of breath with chest pain.  You notice sudden or extreme swelling of your face, hands, ankles, feet, or legs.  You have not felt your baby move in over an hour.  You have severe headaches that do not go away  with medicine.  You have vision changes. Document Released: 10/03/2001 Document Revised: 10/14/2013 Document Reviewed: 12/10/2012 [facility] Patient Information 2015 [place], [place]. This information is not intended to replace advice given to you by your health care provider. Make sure you discuss any questions you have with your health care provider.

## 2018-02-05 NOTE — Progress Notes (Signed)
HIGH-RISK PREGNANCY VISIT Patient name: Hannah Ryan MRN [id number]  Date of birth: [birth year] Chief Complaint:   High Risk Gestation (US today; feet s[place]ide [place])  History of Present Illness:   Hannah Ryan is a 27 y.o. G1P0 female at 2468w1d with an Estimated Date of Delivery: 05/27/18 being seen today for ongoing management of a high-risk pregnancy complicated by h/o CHTN no meds, polysubstance abuse.  Today she reports some swelling in feet. Discussed last UDS still positive for cocaine, THC, opiates, and oxycodone. Reports no cocaine use since beginning of pregnancy, has touched it since- last about a week ago. THC use yesterday for appetite. Getting oxycodone and hydrocodone from a friend, takes 1/2 pill/day to help w/ bone pain. BP elevated today for first time this pregnancy, denies any cocaine use/touching today. .Denies ha, visual changes, ruq/epigastric pain, n/v.   Contractions: Not present. Vag. Bleeding: None.  Movement: Present. denies leaking of fluid.  Review of Systems:   Pertinent items are noted in HPI Denies abnormal vaginal discharge w/ itching/odor/irritation, headaches, visual changes, shortness of breath, chest pain, abdominal pain, severe nausea/vomiting, or problems with urination or bowel movements unless otherwise stated above. Pertinent History Reviewed:  Reviewed past medical,surgical, social, obstetrical and family history.  Reviewed problem list, medications and allergies. Physical Assessment:   Vitals:   02/05/18 1451  BP: (!) 152/60  Pulse: (!) 101  Weight: (!) 358 lb (162.4 kg)  Body mass index is 52.87 kg/m.           Physical Examination:   General appearance: alert, well appearing, and in no distress  Mental status: alert, oriented to person, place, and time  Skin: warm & dry   Extremities: Edema: Trace    Cardiovascular: normal heart rate noted  Respiratory: normal respiratory effort, no distress  Abdomen: gravid, soft, non-tender  Pelvic:  Cervical exam deferred         Fetal Status:     Movement: Present    Fetal Surveillance Testing today: US 24+1 wks,breech,cx 3.4 cm,posterior pl gr 0,normal ovaries bilat,fhr 150 bpm,svp of fluid 5.7 cm,EFW 739 g 72%,anatomy of the face and heart are complete,no obvious abnormalities    No results found for this or any previous visit (from the past 24 hour(s)).  Assessment & Plan:  1) High-risk pregnancy G1P0 at 2168w1d with an Estimated Date of Delivery: 05/27/18   2) CHTN-no meds, BP elevated for first time today, denies recent cocaine use, asymptomatic, no proteinuria, will get baseline pre-e labs. Continue baby asa  3) Polysubstance abuse, discussed in detail today. Strongly advised no more being near/touching/using cocaine, reviewed potential for fetal/maternal death, loss of custody of child. Also advised complete cessation of THC- discussed potential long-term effects to baby. Recommended she make appt w/ addiction specialist to discuss all substance abuse as well as opiate use. She reports a very low dosage per day right now, so should be ok for her to wean herself off, however I feel she would benefit from counseling/recommendations from specialist. Gave numbers to Dr. Cathey EndowBowen and [facility]asmine[facility] and they got her an appt w/ Dr. Cathey EndowBowen for this week. Repeat UDS today.   4) BMI 52.87  Meds: No orders of the defined types were placed in this encounter.  Labs/procedures today: CMP, P:C ratio  Treatment Plan:  Will f/u in 2 weeks, if BP still elevated will plan [facility], monitor for need for meds  Reviewed: Preterm  labor symptoms and general obstetric precautions including but not limited to vaginal bleeding, contractions, leaking of fluid and fetal movement were reviewed in detail with the patient.  All questions were answered.  Follow-up: Return in about 2 weeks (around 02/19/2018) for HROB.  Orders Placed This  Encounter  Procedures  . Pain Management Screening Profile (10S)  . Comprehensive metabolic panel  . Protein / creatinine ratio, urine  . POCT urinalysis dipstick   Cheral Marker CNM, [facility] 02/05/2018 3:29 PM

## 2018-02-06 LAB — COMPREHENSIVE METABOLIC PANEL
ALT: 6 IU/L (ref 0–32)
AST: 10 IU/L (ref 0–40)
Albumin/Globulin Ratio: 1.5 (ref 1.2–2.2)
Albumin: 3.4 g/dL — ABNORMAL LOW (ref 3.5–5.5)
Alkaline Phosphatase: 51 IU/L (ref 39–117)
BUN / CREAT RATIO: 9 (ref 9–23)
BUN: 4 mg/dL — ABNORMAL LOW (ref 6–20)
Bilirubin Total: <0.2 mg/dL (ref 0.0–1.2)
CALCIUM: 8.8 mg/dL (ref 8.7–10.2)
CO2: 25 mmol/L (ref 20–29)
CREATININE: 0.46 mg/dL — AB (ref 0.57–1.00)
Chloride: 105 mmol/L (ref 96–106)
GFR calc Af Amer: 159 mL/min/{1.73_m2} (ref >59)
GFR, EST NON AFRICAN AMERICAN: 138 mL/min/{1.73_m2} (ref >59)
GLOBULIN, TOTAL: 2.3 g/dL (ref 1.5–4.5)
Glucose: 64 mg/dL — ABNORMAL LOW (ref 65–99)
Potassium: 3.3 mmol/L — ABNORMAL LOW (ref 3.5–5.2)
SODIUM: 141 mmol/L (ref 134–144)
Total Protein: 5.7 g/dL — ABNORMAL LOW (ref 6.0–8.5)

## 2018-02-06 LAB — PMP SCREEN PROFILE (10S), URINE
Amphetamine Scrn, Ur: NEGATIVE ng/mL
BARBITURATE SCREEN URINE: NEGATIVE ng/mL
BENZODIAZEPINE SCREEN, URINE: NEGATIVE ng/mL
CANNABINOIDS UR QL SCN: POSITIVE ng/mL — AB
COCAINE(METAB.)SCREEN, URINE: POSITIVE ng/mL — AB
Creatinine(Crt), U: 123.6 mg/dL (ref 20.0–300.0)
Methadone Screen, Urine: NEGATIVE ng/mL
OXYCODONE+OXYMORPHONE UR QL SCN: POSITIVE ng/mL — AB
Opiate Scrn, Ur: POSITIVE ng/mL — AB
PH UR, DRUG SCRN: 6.3 (ref 4.5–8.9)
Phencyclidine Qn, Ur: NEGATIVE ng/mL
Propoxyphene Scrn, Ur: NEGATIVE ng/mL

## 2018-02-06 LAB — PROTEIN / CREATININE RATIO, URINE
Creatinine, Urine: 114 mg/dL
Protein, Ur: 16.9 mg/dL
Protein/Creat Ratio: 148 mg/g creat (ref 0–200)

## 2018-02-10 ENCOUNTER — Inpatient Hospital Stay (HOSPITAL_COMMUNITY)
Admission: EM | Admit: 2018-02-10 | Discharge: 2018-02-10 | Disposition: A | Discharge status: Home / Self Care | Payer: Medicaid Other | Attending: Family Medicine | Admitting: Family Medicine

## 2018-02-10 ENCOUNTER — Inpatient Hospital Stay (HOSPITAL_COMMUNITY): Payer: Medicaid Other

## 2018-02-10 ENCOUNTER — Other Ambulatory Visit: Payer: Self-pay

## 2018-02-10 ENCOUNTER — Encounter (HOSPITAL_COMMUNITY): Payer: Self-pay | Admitting: Emergency Medicine

## 2018-02-10 DIAGNOSIS — Z3A24 24 weeks gestation of pregnancy: Secondary | ICD-10-CM | POA: Diagnosis not present

## 2018-02-10 DIAGNOSIS — Z79899 Other long term (current) drug therapy: Secondary | ICD-10-CM | POA: Insufficient documentation

## 2018-02-10 DIAGNOSIS — Z7982 Long term (current) use of aspirin: Secondary | ICD-10-CM | POA: Diagnosis not present

## 2018-02-10 DIAGNOSIS — O4592 Premature separation of placenta, unspecified, second trimester: Secondary | ICD-10-CM | POA: Insufficient documentation

## 2018-02-10 DIAGNOSIS — O99342 Other mental disorders complicating pregnancy, second trimester: Secondary | ICD-10-CM | POA: Diagnosis not present

## 2018-02-10 DIAGNOSIS — F17210 Nicotine dependence, cigarettes, uncomplicated: Secondary | ICD-10-CM | POA: Insufficient documentation

## 2018-02-10 DIAGNOSIS — F329 Major depressive disorder, single episode, unspecified: Secondary | ICD-10-CM | POA: Insufficient documentation

## 2018-02-10 DIAGNOSIS — Z3A25 25 weeks gestation of pregnancy: Secondary | ICD-10-CM | POA: Insufficient documentation

## 2018-02-10 DIAGNOSIS — O4692 Antepartum hemorrhage, unspecified, second trimester: Secondary | ICD-10-CM | POA: Diagnosis not present

## 2018-02-10 DIAGNOSIS — F419 Anxiety disorder, unspecified: Secondary | ICD-10-CM | POA: Insufficient documentation

## 2018-02-10 DIAGNOSIS — O99212 Obesity complicating pregnancy, second trimester: Secondary | ICD-10-CM | POA: Insufficient documentation

## 2018-02-10 DIAGNOSIS — O99332 Smoking (tobacco) complicating pregnancy, second trimester: Secondary | ICD-10-CM | POA: Diagnosis not present

## 2018-02-10 DIAGNOSIS — Z3492 Encounter for supervision of normal pregnancy, unspecified, second trimester: Secondary | ICD-10-CM

## 2018-02-10 DIAGNOSIS — N939 Abnormal uterine and vaginal bleeding, unspecified: Secondary | ICD-10-CM

## 2018-02-10 DIAGNOSIS — F1910 Other psychoactive substance abuse, uncomplicated: Secondary | ICD-10-CM

## 2018-02-10 DIAGNOSIS — O4590 Premature separation of placenta, unspecified, unspecified trimester: Secondary | ICD-10-CM | POA: Diagnosis present

## 2018-02-10 LAB — COMPREHENSIVE METABOLIC PANEL
ALT: 8 U/L — AB (ref 14–54)
AST: 14 U/L — AB (ref 15–41)
Albumin: 2.7 g/dL — ABNORMAL LOW (ref 3.5–5.0)
Alkaline Phosphatase: 44 U/L (ref 38–126)
Anion gap: 8 (ref 5–15)
CHLORIDE: 107 mmol/L (ref 101–111)
CO2: 24 mmol/L (ref 22–32)
CREATININE: 0.43 mg/dL — AB (ref 0.44–1.00)
Calcium: 8.6 mg/dL — ABNORMAL LOW (ref 8.9–10.3)
GFR calc Af Amer: >60 mL/min (ref >60)
GFR calc non Af Amer: >60 mL/min (ref >60)
Glucose, Bld: 94 mg/dL (ref 65–99)
Potassium: 3.5 mmol/L (ref 3.5–5.1)
SODIUM: 139 mmol/L (ref 135–145)
Total Bilirubin: 0.3 mg/dL (ref 0.3–1.2)
Total Protein: 5.7 g/dL — ABNORMAL LOW (ref 6.5–8.1)

## 2018-02-10 LAB — URINALYSIS, ROUTINE W REFLEX MICROSCOPIC
BILIRUBIN URINE: NEGATIVE
BILIRUBIN URINE: NEGATIVE
GLUCOSE, UA: NEGATIVE mg/dL
Glucose, UA: NEGATIVE mg/dL
Hgb urine dipstick: NEGATIVE
Ketones, ur: NEGATIVE mg/dL
Ketones, ur: 5 mg/dL — AB
NITRITE: NEGATIVE
Nitrite: NEGATIVE
PH: 8 (ref 5.0–8.0)
Protein, ur: NEGATIVE mg/dL
Protein, ur: NEGATIVE mg/dL
SPECIFIC GRAVITY, URINE: 1.008 (ref 1.005–1.030)
SPECIFIC GRAVITY, URINE: 1.011 (ref 1.005–1.030)
pH: 9 — ABNORMAL HIGH (ref 5.0–8.0)

## 2018-02-10 LAB — CBC
HCT: 32.8 % — ABNORMAL LOW (ref 36.0–46.0)
HEMATOCRIT: 33.1 % — AB (ref 36.0–46.0)
Hemoglobin: 10.9 g/dL — ABNORMAL LOW (ref 12.0–15.0)
Hemoglobin: 10.8 g/dL — ABNORMAL LOW (ref 12.0–15.0)
MCH: 29.1 pg (ref 26.0–34.0)
MCH: 29 pg (ref 26.0–34.0)
MCHC: 32.9 g/dL (ref 30.0–36.0)
MCHC: 32.9 g/dL (ref 30.0–36.0)
MCV: 88.3 fL (ref 78.0–100.0)
MCV: 87.9 fL (ref 78.0–100.0)
PLATELETS: 226 10*3/uL (ref 150–400)
Platelets: 214 10*3/uL (ref 150–400)
RBC: 3.75 MIL/uL — ABNORMAL LOW (ref 3.87–5.11)
RBC: 3.73 MIL/uL — ABNORMAL LOW (ref 3.87–5.11)
RDW: 13.8 % (ref 11.5–15.5)
RDW: 13.8 % (ref 11.5–15.5)
WBC: 8.1 10*3/uL (ref 4.0–10.5)
WBC: 8.4 10*3/uL (ref 4.0–10.5)

## 2018-02-10 LAB — WET PREP, GENITAL
CLUE CELLS WET PREP: NONE SEEN
Sperm: NONE SEEN
Trich, Wet Prep: NONE SEEN
Yeast Wet Prep HPF POC: NONE SEEN

## 2018-02-10 LAB — PROTEIN / CREATININE RATIO, URINE
Creatinine, Urine: 71 mg/dL
Protein Creatinine Ratio: 0.18 mg/mg{Cre} — ABNORMAL HIGH (ref 0.00–0.15)
Total Protein, Urine: 13 mg/dL

## 2018-02-10 LAB — RAPID URINE DRUG SCREEN, HOSP PERFORMED
Amphetamines: NOT DETECTED
Barbiturates: NOT DETECTED
Benzodiazepines: NOT DETECTED
COCAINE: POSITIVE — AB
OPIATES: NOT DETECTED
TETRAHYDROCANNABINOL: POSITIVE — AB

## 2018-02-10 MED ORDER — BETAMETHASONE SOD PHOS & ACET 6 (3-3) MG/ML IJ SUSP
12.0000 mg | Freq: Once | INTRAMUSCULAR | Status: AC
  Administered 2018-02-10: 12 mg via INTRAMUSCULAR
  Filled 2018-02-10: qty 2

## 2018-02-10 NOTE — Progress Notes (Signed)
Spoke with [facility] RN. [facility] has not been tracing fior the last 12 min. Says she will readjust pt's FM. Says [facility] is difficult to trace due to pt's body habitus.

## 2018-02-10 NOTE — Progress Notes (Signed)
Pt was wanting to leave AMA and signed papers. Second RN came in to reposition fetal heart monitor and talk to pt again about being transferred. Pt asked more questions and stated she wanted to go outside for a few minutes and talk with her mom. Pt stated she would be back and that she just wanted some fresh air. Risks discussed with pt again and pt stated she would be right back.

## 2018-02-10 NOTE — Discharge Instructions (Signed)
Placental Abruption °Placental abruption is a condition in which the placenta partly or completely separates from the uterus before the baby is born. The placenta is the organ that nourishes the unborn baby (fetus). The baby gets his or her blood supply and nutrients through the placenta. It is the baby’s life support system. The placenta is attached to the inside of the uterus until after the baby is born. °Placental abruption is rare, but it can happen any time after 20 weeks of pregnancy. A small separation may not cause problems, but a large separation may be dangerous for you and your baby. A large separation is usually an emergency. It requires treatment right away. °What are the causes? °In most cases, the cause of this condition is not known. °What increases the risk? °This condition is more likely to develop in women who: °· Have experienced a recent trauma such as a fall, an abdominal injury, or a car accident. °· Have a previous placental abruption. °· Have high blood pressure (hypertension). °· Smoke cigarettes, use alcohol, or use illegal drugs such as cocaine. °· Have blood clotting problems. °· Experience preterm premature rupture of membranes (PPROM). °· Have multiples (twins, triplets, or more). °· Have had children before. °· Are 35 years of age or older. ° °What are the signs or symptoms? °Symptoms of this condition can vary from mild to severe. °A small placental abruption may not cause symptoms, or it may cause mild symptoms, which may include: °· Mild abdominal pain or lower back pain. °· Slight vaginal bleeding. ° °A severe placental abruption will cause symptoms. The symptoms will depend on the size of the separation and the stage of pregnancy. They may include: °· Abdominal pain or lower back pain. °· Vaginal bleeding. °· Tender and hard uterus. °· Severe abdominal pain with tenderness. °· Continual contractions of your uterus. °· Weakness and light-headedness. ° °How is this  diagnosed? °This condition may be diagnosed based on: °· Your symptoms. °· A physical exam. °· Ultrasound. °· Blood work. This will be done to make sure that there are enough healthy red blood cells and that there are no clotting problems or signs of too much blood loss. ° °How is this treated? °Treatment for placental abruption depends on the severity of the condition. °For mild cases, treatment may involve monitoring your condition and managing your symptoms. This may involve: °· Bed rest and close observation. ° °For more severe cases, emergency treatment is needed. This may involve: °· Staying in the hospital until you and your baby are stabilized. °· Cesarean delivery of your baby. °· A blood transfusion or other fluids given through an IV tube. °· Other treatments, depending on: °? The amount of bleeding you have. °? Whether you or your baby are in distress. °? The stage of your pregnancy. °? The maturity of the baby. ° °Follow these instructions at home: °· Take over-the-counter and prescription medicines only as told by your health care provider. Do not take any medicines that your health care provider has not approved. °· Arrange for help at home before and after you deliver your baby, especially if you had a cesarean delivery or if you lost a lot of blood. °· Get plenty of rest and sleep. °· Do not use illegal drugs. °· Do not drink alcohol. °· Do not have sexual intercourse until your health care provider says it is okay. °· Do not use tampons or douche unless your health care provider says it is okay. °·   Do not use any products that contain nicotine or tobacco, such as cigarettes and e-cigarettes. If you need help quitting, ask your health care provider. °Get help right away if: °· You have vaginal bleeding or spotting. °· You have any type of trauma, such as a fall, abdominal trauma, or a car accident. °· You have abdominal pain. °· You have continuous uterine contractions. °· You have a hard, tender  uterus. °· You do not feel the baby move, or the baby moves very little. °This information is not intended to replace advice given to you by your health care provider. Make sure you discuss any questions you have with your health care provider. °Document Released: 10/09/2005 Document Revised: 06/08/2016 Document Reviewed: 04/30/2016 °Elsevier Interactive Patient Education © 2018 [employer] ° °

## 2018-02-10 NOTE — MAU Note (Signed)
Was at [facility], reports seeing 2 spots of blood this AM, no pain, bleeding has stopped, had intercourse on Friday.

## 2018-02-10 NOTE — ED Notes (Addendum)
[facility] called related to possibly transporting pt to [facility] as a heads up. Per [facility] as soon as a truck is available they will transport.

## 2018-02-10 NOTE — MAU Provider Note (Addendum)
History     CSN: [id number]  Arrival date and time: 02/10/18 1117   First Provider Initiated Contact with Patient 02/10/18 1556      Chief Complaint  Patient presents with  . Vaginal Bleeding    [redacted] wks pregnant   Hannah Ryan is a 27 y.o. G1P0 at 2631w6d who presents today as transfer from [facility] for vaginal bleeding.   Vaginal Bleeding  The patient's primary symptoms include vaginal bleeding. The patient's pertinent negatives include no pelvic pain or vaginal discharge. This is a new problem. The current episode started today. The problem occurs intermittently. The problem has been unchanged. The patient is experiencing no pain. She is pregnant. Associated symptoms include frequency. Pertinent negatives include no abdominal pain, chills, dysuria, fever, nausea or vomiting. Vaginal bleeding amount: had some blood in the toilet around 0500, and on her sheets as well. Since around 0800 it has been brown spotting.  She has not been passing clots. She has not been passing tissue. Nothing (last intercourse 2 days ago. ) aggravates the symptoms. She has tried nothing for the symptoms. Menstrual history: LMP 08/13/18    Past Medical History:  Diagnosis Date  . Anxiety    per patient/family  . Chlamydia   . Depression    per patient/family  . Gonorrhea   . Obesity   . Trichomonas infection     Past Surgical History:  Procedure Laterality Date  . ADENOIDECTOMY    . FOOT SURGERY     left  . IRRIGATION AND DEBRIDEMENT ABSCESS  01/08/2012   Procedure: IRRIGATION AND DEBRIDEMENT ABSCESS;  Surgeon: Fabio BeringBrent C Ziegler, MD;  Location: [facility];  Service: General;  Laterality: Right;  . TONSILLECTOMY      Family History  Problem Relation Age of Onset  . Diabetes Maternal Grandmother   . Hypertension Maternal Grandmother   . Hypertension Father   . Hypertension Mother   . Diabetes Mother   . Autism Brother   . Lupus Paternal Aunt   . Lupus Cousin     Social History   Tobacco  Use  . Smoking status: Current Some Day Smoker    Packs/day: 0.25    Years: 3.00    Pack years: 0.75    Types: Cigarettes  . Smokeless tobacco: Never Used  Substance Use Topics  . Alcohol use: No    Frequency: Never  . Drug use: Yes    Types: Marijuana, Cocaine, Hydrocodone    Comment: not during pregnancy per pt    Allergies: No Known Allergies  Medications Prior to Admission  Medication Sig Dispense Refill Last Dose  . aspirin EC 81 MG tablet Take 1 tablet (81 mg total) by mouth daily. 30 tablet 6 Past Week at Unknown time  . Prenatal Vit-Fe Fumarate-FA (PNV PRENATAL PLUS MULTIVITAMIN) 27-1 MG TABS Take 1 tablet by mouth daily. 30 tablet 11 Past Week at Unknown time  . promethazine (PHENERGAN) 25 MG tablet Take 1 tablet (25 mg total) by mouth every 6 (six) hours as needed for nausea or vomiting. (Patient not taking: Reported on 02/10/2018) 30 tablet 1 Not Taking at Unknown time    Review of Systems  Constitutional: Negative for chills and fever.  Gastrointestinal: Negative for abdominal pain, nausea and vomiting.  Genitourinary: Positive for frequency and vaginal bleeding. Negative for dysuria, pelvic pain and vaginal discharge.   Physical Exam   Blood pressure 111/62, pulse 79, temperature 97.9 F (36.6 C), temperature source Oral, resp. rate 16, weight Marland Kitchen(!)  358 lb (162.4 kg), last menstrual period 08/13/2017, SpO2 100 %.  Physical Exam  Nursing note and vitals reviewed. Constitutional: She is oriented to person, place, and time. She appears well-developed and well-nourished. No distress.  HENT:  Head: Normocephalic.  Cardiovascular: Normal rate.  Respiratory: Effort normal.  GI: Soft. There is no tenderness. There is no rebound.  Genitourinary:  Genitourinary Comments:  External: no lesion Vagina: small amount of brown/tan discharge Cervix: pink, smooth, no CMT, closed/thick/firm Uterus: AGA   Neurological: She is alert and oriented to person, place, and time.   Skin: Skin is warm and dry.  Psychiatric: She has a normal mood and affect.   FHT: 155, moderate, with 10x10 accels, no decels, but difficult to trace 2/2 maternal body habitus.  Toco: no UCs   [place]: 4.1 x 2.3 x 4.3 cm ?clot or marginal abruption v previa/low lying placenta.  MAU Course  Procedures  MDM  1829: DW Dr. Shawnie Pons, and she needs to be admitted at this time. NICU is full. She will call NICU.   DW patient at length that we recommend admission and obs for abruption. She does not want to be admitted at this time. She is wiggling all over the bed with her clothes mostly off. She says that she just can't stay here right now because she needs to get home to take care of some stuff (I suspect she is looking to use cocaine as that has been + on multiple visits). Patient advised that she could further abrupt, and the fetus could die. She verbalizes understanding, but states that she must go at this time. She says she will come back if she has any bleeding.  DW Dr. Shawnie Pons, will give BMZ today and will have her return to FT tomorrow after noon for repeat BMZ.   1930: Patient left the unit in her gown. She returned, and states that she will now stay for admission at [facility]. Dr. Shawnie Pons notified, and she spoke with MFM. Dr. Otho Perl is accepting at [facility].   Assessment and Plan   1. Vaginal bleeding   2. Second trimester pregnancy   3. [redacted] weeks gestation of pregnancy   4. Second trimester bleeding   5. Antepartum placental abruption    Transfer to [facility] 02/10/2018, 3:59 PM

## 2018-02-10 NOTE — MAU Note (Signed)
[facility] that it will be 2 hours before they can transport patient to their facility. [facility] called for availability.

## 2018-02-10 NOTE — ED Provider Notes (Signed)
[facility] EMERGENCY DEPARTMENT Provider Note   CSN: [id number] Arrival date & time: 02/10/18  1117     History   Chief Complaint Chief Complaint  Patient presents with  . Vaginal Bleeding    [redacted] wks pregnant    HPI Hannah Ryan is a 27 y.o. female.  She is a G1, P0 [redacted] weeks pregnant.  She is complaining of some vaginal bleeding that occurred this morning.  She states she went to the bathroom around 5 AM 1 pack to bed and that her roommate noticed later in the morning that there was blood on the toilet.  Patient also noticed that there was blood on the sheets.  The patient has had no further bleeding since then.  There is been no problems with this pregnancy other than her drug screens being positive.  She saw her OB last week and was positive for opiates and cocaine and has not used any drugs since then.  She had an ultrasound at that time that was normal with a posterior placenta.  She is complaining of no abdominal pain.  There is been no fever no nausea no vomiting no urinary symptoms and normal bowel movements.  There is no blood in her bowel movement.  She did have vaginal intercourse on Friday but nothing since then.  The history is provided by the patient.  Vaginal Bleeding  Primary symptoms include vaginal bleeding.  Primary symptoms include no dysuria. There has been no fever. This is a new problem. The current episode started 6 to 12 hours ago. The problem has been resolved. The symptoms occur spontaneously. She is pregnant. Her LMP was months ago. The discharge was normal. Pertinent negatives include no abdominal pain, no constipation, no diarrhea, no nausea, no vomiting, no frequency, no light-headedness and no dizziness. She has tried nothing for the symptoms. Sexual activity: sexually active.    Past Medical History:  Diagnosis Date  . Anxiety    per patient/family  . Chlamydia   . Depression    per patient/family  . Gonorrhea   . Obesity   . Trichomonas  infection     Patient Active Problem List   Diagnosis Date Noted  . Supervision of high risk pregnancy, antepartum 11/20/2017  . Polysubstance abuse (HCC) 11/20/2017  . Cellulitis and abscess of foot 01/05/2012  . Chronic hypertension during pregnancy, antepartum 01/05/2012  . Morbid obesity (HCC) 01/05/2012    Past Surgical History:  Procedure Laterality Date  . ADENOIDECTOMY    . FOOT SURGERY     left  . IRRIGATION AND DEBRIDEMENT ABSCESS  01/08/2012   Procedure: IRRIGATION AND DEBRIDEMENT ABSCESS;  Surgeon: Fabio Bering, MD;  Location: [facility];  Service: General;  Laterality: Right;  . TONSILLECTOMY       OB History    Gravida  1   Para      Term      Preterm      AB      Living        SAB      TAB      Ectopic      Multiple      Live Births               Home Medications    Prior to Admission medications   Medication Sig Start Date End Date Taking? Authorizing Provider  aspirin EC 81 MG tablet Take 1 tablet (81 mg total) by mouth daily. 11/20/17   Jacklyn Shell, CNM  HYDROcodone-acetaminophen (NORCO/VICODIN) 5-325 MG tablet Take 1 tablet by mouth every 6 (six) hours as needed for moderate pain.    [provider]  Prenatal Vit-Fe Fumarate-FA (PNV PRENATAL PLUS MULTIVITAMIN) 27-1 MG TABS Take 1 tablet by mouth daily. 11/20/17   Cresenzo-Dishmon, Scarlette CalicoFrances, CNM  promethazine (PHENERGAN) 25 MG tablet Take 1 tablet (25 mg total) by mouth every 6 (six) hours as needed for nausea or vomiting. 10/04/17   Adline PotterGriffin, Jennifer A, NP    Family History Family History  Problem Relation Age of Onset  . Diabetes Maternal Grandmother   . Hypertension Maternal Grandmother   . Hypertension Father   . Hypertension Mother   . Diabetes Mother   . Autism Brother   . Lupus Paternal Aunt   . Lupus Cousin     Social History Social History   Tobacco Use  . Smoking status: Current Some Day Smoker    Packs/day: 0.25    Years: 3.00    Pack  years: 0.75    Types: Cigarettes  . Smokeless tobacco: Never Used  Substance Use Topics  . Alcohol use: No    Frequency: Never  . Drug use: Yes    Types: Marijuana, Cocaine, Hydrocodone    Comment: not during pregnancy per pt     Allergies   Patient has no known allergies.   Review of Systems Review of Systems  Constitutional: Negative for chills and fever.  HENT: Negative for ear pain and sore throat.   Eyes: Negative for pain and visual disturbance.  Respiratory: Negative for cough and shortness of breath.   Cardiovascular: Negative for chest pain and palpitations.  Gastrointestinal: Negative for abdominal pain, constipation, diarrhea, nausea and vomiting.  Genitourinary: Positive for vaginal bleeding. Negative for dysuria, frequency and hematuria.  Musculoskeletal: Negative for arthralgias and back pain.  Skin: Negative for color change and rash.  Neurological: Negative for dizziness, seizures, syncope and light-headedness.  All other systems reviewed and are negative.    Physical Exam Updated Vital Signs BP (!) 143/71 (BP Location: Right Arm)   Pulse (!) 105   Temp 97.9 F (36.6 C) (Oral)   Resp 20   Wt (!) 162.4 kg (358 lb)   LMP 08/13/2017   SpO2 98%   BMI 52.87 kg/m   Physical Exam  Constitutional: She appears well-developed and well-nourished. No distress.  HENT:  Head: Normocephalic and atraumatic.  Eyes: Conjunctivae are normal.  Neck: Neck supple.  Cardiovascular: Normal rate and regular rhythm.  No murmur heard. Pulmonary/Chest: Effort normal and breath sounds normal. No respiratory distress.  Abdominal: Soft. There is no tenderness.  Musculoskeletal: She exhibits no edema, tenderness or deformity.  Neurological: She is alert.  Skin: Skin is warm and dry. Capillary refill takes less than 2 seconds.  Psychiatric: She has a normal mood and affect.  Nursing note and vitals reviewed.    ED Treatments / Results  Labs (all labs ordered are  listed, but only abnormal results are displayed) Labs Reviewed  WET PREP, GENITAL - Abnormal; Notable for the following components:      Result Value   WBC, Wet Prep HPF POC FEW (*)    All other components within normal limits  URINALYSIS, ROUTINE W REFLEX MICROSCOPIC - Abnormal; Notable for the following components:   APPearance CLOUDY (*)    Hgb urine dipstick MODERATE (*)    Leukocytes, UA MODERATE (*)    Bacteria, UA RARE (*)    Squamous Epithelial / LPF 6-30 (*)  All other components within normal limits  CBC - Abnormal; Notable for the following components:   RBC 3.75 (*)    Hemoglobin 10.9 (*)    HCT 33.1 (*)    All other components within normal limits  URINALYSIS, ROUTINE W REFLEX MICROSCOPIC - Abnormal; Notable for the following components:   APPearance HAZY (*)    pH 9.0 (*)    Ketones, ur 5 (*)    Leukocytes, UA TRACE (*)    Bacteria, UA RARE (*)    Squamous Epithelial / LPF 0-5 (*)    All other components within normal limits  RAPID URINE DRUG SCREEN, HOSP PERFORMED - Abnormal; Notable for the following components:   Cocaine POSITIVE (*)    Tetrahydrocannabinol POSITIVE (*)    All other components within normal limits  CBC - Abnormal; Notable for the following components:   RBC 3.73 (*)    Hemoglobin 10.8 (*)    HCT 32.8 (*)    All other components within normal limits  COMPREHENSIVE METABOLIC PANEL - Abnormal; Notable for the following components:   BUN <5 (*)    Creatinine, Ser 0.43 (*)    Calcium 8.6 (*)    Total Protein 5.7 (*)    Albumin 2.7 (*)    AST 14 (*)    ALT 8 (*)    All other components within normal limits  PROTEIN / CREATININE RATIO, URINE - Abnormal; Notable for the following components:   Protein Creatinine Ratio 0.18 (*)    All other components within normal limits  GC/CHLAMYDIA PROBE AMP ([place]) NOT AT [facility]    EKG None  Radiology No results found.  Procedures Procedures (including critical care time)  Medications  Ordered in ED Medications - No data to display   Initial Impression / Assessment and Plan / ED Course  I have reviewed the triage vital signs and the nursing notes.  Pertinent labs & imaging results that were available during my care of the patient were reviewed by me and considered in my medical decision making (see chart for details).  Clinical Course as of Feb 13 1016  Sun Feb 10, 2018  1205 Fetal monitoring reviewed by [facility] and reviewed by their attending.  They are recommending transfer for further evaluation down there.   [MB]    Clinical Course User Index [MB] Terrilee Files, MD     Final Clinical Impressions(s) / ED Diagnoses   Final diagnoses:  Vaginal bleeding  Second trimester pregnancy  Antepartum placental abruption  Polysubstance abuse [facility])    ED Discharge Orders    None       Terrilee Files, MD 02/12/18 1018

## 2018-02-10 NOTE — Progress Notes (Signed)
Received call from [facility] RN. Pt is a G1P0 at 24 6/[redacted] weeks gestation with c/o vaginal bleeding this morning No vaginal bleeding or pain at this time. [facility] She has an ongoing hx of drug abuse. Pt was positive for opiates and  Cocaine. Ultrasound showed posterior placenta.Will admit pt into [facility].

## 2018-02-10 NOTE — MAU Note (Signed)
Pt arrived [facility] with c/o vaginal bleeding.

## 2018-02-10 NOTE — ED Notes (Signed)
OB rapid response notified about pt and complaint. Placing pt on the monitor. [facility], first [facility] without complications. Last intercourse Friday. Last OB/GYN appointment was 02/05/18.

## 2018-02-10 NOTE — ED Notes (Signed)
[facility] at bedside 

## 2018-02-10 NOTE — ED Triage Notes (Signed)
Pt reports getting up to use the bathroom at 5am, but did not cut the light on.  Her roommate came home and asked if she was bleeding because there was blood on the toilet.  Pt states her bed had blood in it, but denies currently bleeding.  Endorses positive fetal movement last night and only 1 kick this morning.

## 2018-02-10 NOTE — Progress Notes (Signed)
Spoke with [facility] RN. [facility] for further [facility]. Admit to Dr. Shawnie PonsPratt.

## 2018-02-10 NOTE — ED Notes (Signed)
[facility] called to transport [facility],  [facility] on the way.

## 2018-02-10 NOTE — Progress Notes (Signed)
Pt back on unit with mom.

## 2018-02-10 NOTE — ED Notes (Signed)
Pt ambulated to restroom with steady and even gait. Denies vaginal bleeding or spotting at this time.

## 2018-02-10 NOTE — MAU Note (Addendum)
Received report from off going RN. RN met with Pt outside. She was smoking a cigarette and talking with her mom and friends. Pt states that she wants to be transferred to [facility] for further care. Pt agreeable to come back to be monitored.

## 2018-02-10 NOTE — ED Notes (Signed)
Gave report to [facility] 1335

## 2018-02-10 NOTE — Progress Notes (Signed)
Spoke with Dr. Shawnie PonsPratt. Pt is a G1P0 at 24 6/[redacted] weeks gestation presenting to [facility] with c/o vaginal bleeding this morning. Pt has a hx of THC, cocaine, and opioid use during this pregnancy. Ultrasound last week showed posterior placenta. Pt has just been placed on FM. [facility]. ED RN reports that pt is not having any active bleeding now.

## 2018-02-11 LAB — GC/CHLAMYDIA PROBE AMP (~~LOC~~) NOT AT ARMC
CHLAMYDIA, DNA PROBE: NEGATIVE
Neisseria Gonorrhea: NEGATIVE

## 2018-02-18 ENCOUNTER — Telehealth: Payer: Self-pay | Admitting: *Deleted

## 2018-02-18 NOTE — Telephone Encounter (Signed)
Called to verify that patient is going to need to finish OB at [place].  Mother states that is correct and will no longer be coming to our office.

## 2018-02-19 ENCOUNTER — Encounter: Payer: Medicaid Other | Admitting: Women's Health

## 2018-04-26 DIAGNOSIS — Z7689 Persons encountering health services in other specified circumstances: Secondary | ICD-10-CM | POA: Diagnosis not present

## 2018-05-06 DIAGNOSIS — Z7689 Persons encountering health services in other specified circumstances: Secondary | ICD-10-CM | POA: Diagnosis not present

## 2018-05-08 DIAGNOSIS — O411230 Chorioamnionitis, third trimester, not applicable or unspecified: Secondary | ICD-10-CM | POA: Diagnosis not present

## 2018-05-08 DIAGNOSIS — Z3A37 37 weeks gestation of pregnancy: Secondary | ICD-10-CM | POA: Diagnosis not present

## 2018-05-08 DIAGNOSIS — O114 Pre-existing hypertension with pre-eclampsia, complicating childbirth: Secondary | ICD-10-CM | POA: Diagnosis not present

## 2018-05-08 DIAGNOSIS — O1002 Pre-existing essential hypertension complicating childbirth: Secondary | ICD-10-CM | POA: Diagnosis not present

## 2018-05-17 DIAGNOSIS — R102 Pelvic and perineal pain: Secondary | ICD-10-CM | POA: Diagnosis not present

## 2018-05-17 DIAGNOSIS — O1003 Pre-existing essential hypertension complicating the puerperium: Secondary | ICD-10-CM | POA: Diagnosis not present

## 2018-05-20 ENCOUNTER — Telehealth: Payer: Self-pay | Admitting: Obstetrics & Gynecology

## 2018-05-20 ENCOUNTER — Encounter: Payer: Medicaid Other | Admitting: Pediatrics

## 2018-05-20 NOTE — Telephone Encounter (Signed)
Patient states she feels like her incision has opened up.  Say she is having clear to very light pink drainage from the incision. Patient will send mychart message with image.

## 2018-05-21 ENCOUNTER — Encounter: Payer: Self-pay | Admitting: *Deleted

## 2018-05-22 ENCOUNTER — Ambulatory Visit (INDEPENDENT_AMBULATORY_CARE_PROVIDER_SITE_OTHER): Payer: Medicaid Other | Admitting: Obstetrics and Gynecology

## 2018-05-22 ENCOUNTER — Encounter: Payer: Self-pay | Admitting: Obstetrics and Gynecology

## 2018-05-22 VITALS — BP 128/72 | HR 91 | Ht 68.0 in | Wt 343.0 lb

## 2018-05-22 DIAGNOSIS — O9089 Other complications of the puerperium, not elsewhere classified: Secondary | ICD-10-CM

## 2018-05-22 DIAGNOSIS — Z9889 Other specified postprocedural states: Secondary | ICD-10-CM

## 2018-05-22 MED ORDER — DOXYCYCLINE HYCLATE 100 MG PO CAPS: 100.0000 mg | ORAL_CAPSULE | Freq: Two times a day (BID) | ORAL | 0 refills | Status: DC

## 2018-05-22 MED ORDER — HYDROCODONE-ACETAMINOPHEN 5-325 MG PO TABS: 1.0000 | ORAL_TABLET | Freq: Four times a day (QID) | ORAL | 0 refills | Status: DC | PRN

## 2018-05-22 NOTE — Addendum Note (Signed)
Addended by: Sherre LainASH, Izick Gasbarro A on: 05/22/2018 04:32 PM   Modules accepted: Orders

## 2018-05-22 NOTE — Progress Notes (Signed)
Patient ID: [name], female   DOB: November 02, 1990, 27 y.o.   MRN: [id number]    [facility] Clinic Visit  @DATE @            Patient name: Hannah Ryan MRN [id number]  Date of birth: April 23, 1991  CC & HPI:  Hannah Ryan is a 27 y.o. female presenting today for leaking and hardness at incision site. Lamiya had a c-section 05/08/18. Her baby was 3 weeks early and she had III. She went to [facility] when her water broke 7/16 with BP of 164/112 and fever of 104. She was shipped directly to [facility].  There was a lot of drainage when first started but now there is only a little drainage. She bottle feeds her baby.  ROS:  ROS +leakage and hardness at incision site -no fever at this tio  Pertinent History Reviewed:   Reviewed: Significant for C-section Medical         Past Medical History:  Diagnosis Date  . Anxiety    per patient/family  . Chlamydia   . Depression    per patient/family  . Gonorrhea   . Obesity   . Trichomonas infection                               Surgical Hx:    Past Surgical History:  Procedure Laterality Date  . ADENOIDECTOMY    . FOOT SURGERY     left  . IRRIGATION AND DEBRIDEMENT ABSCESS  01/08/2012   Procedure: IRRIGATION AND DEBRIDEMENT ABSCESS;  Surgeon: Fabio Bering, MD;  Location: [facility];  Service: General;  Laterality: Right;  . TONSILLECTOMY     Medications: Reviewed & Updated - see associated section                       Current Outpatient Medications:  .  acetaminophen (TYLENOL) 500 MG tablet, Take 500 mg by mouth every 6 (six) hours as needed., Disp: , Rfl:  .  ibuprofen (ADVIL,MOTRIN) 800 MG tablet, Take 800 mg by mouth every 8 (eight) hours as needed., Disp: , Rfl:  .  aspirin EC 81 MG tablet, Take 1 tablet (81 mg total) by mouth daily. (Patient not taking: Reported on 05/22/2018), Disp: 30 tablet, Rfl: 6 .  Prenatal Vit-Fe Fumarate-FA (PNV PRENATAL PLUS MULTIVITAMIN) 27-1 MG TABS, Take 1 tablet by mouth daily. (Patient not  taking: Reported on 05/22/2018), Disp: 30 tablet, Rfl: 11 .  promethazine (PHENERGAN) 25 MG tablet, Take 1 tablet (25 mg total) by mouth every 6 (six) hours as needed for nausea or vomiting. (Patient not taking: Reported on 02/10/2018), Disp: 30 tablet, Rfl: 1   Social History: Reviewed -  reports that she has been smoking cigarettes.  She has a 0.75 pack-year smoking history. She has never used smokeless tobacco.  Objective Findings:  Vitals: Blood pressure 128/72, pulse 91, height 5\' 8"  (1.727 m), weight (!) 343 lb (155.6 kg), last menstrual period 08/13/2017.  PHYSICAL EXAMINATION General appearance - alert, well appearing, and in no distress, oriented to person, place, and time and in mild distress Mental status - alert, oriented to person, place, and time, normal mood, behavior, speech, dress, motor activity, and thought processes, affect appropriate to mood PELVIC  Incision Site- clear drainage and hardness at incision site  Assessment & Plan:   A:  1. Draining Wound seroma, r/o wound infection 2 day 14 s/p  cesarean for III, PPROM, [facility] P:  1. Wound Culture 2. Rx Doxycycline 10 days 3. Rx Hydrocodone/Vicodone    By signing my name below, I, Arnette NorrisMari Johnson, attest that this documentation has been prepared under the direction and in the presence of Tilda BurrowFerguson, Raevin Wierenga V, MD. Electronically Signed: Arnette NorrisMari Johnson Medical Scribe. 05/22/18. 2:55 PM.  I personally performed the services described in this documentation, which was SCRIBED in my presence. The recorded information has been reviewed and considered accurate. It has been edited as necessary during review. Tilda BurrowJohn V Kolby Myung, MD

## 2018-05-25 LAB — WOUND CULTURE: ORGANISM ID, BACTERIA: NONE SEEN

## 2018-05-27 ENCOUNTER — Telehealth: Payer: Self-pay | Admitting: Obstetrics and Gynecology

## 2018-05-27 MED ORDER — CEPHALEXIN 500 MG PO CAPS
500.0000 mg | ORAL_CAPSULE | Freq: Four times a day (QID) | ORAL | 0 refills | Status: DC
Start: 2018-05-27 — End: 2019-02-21

## 2018-05-27 NOTE — Telephone Encounter (Signed)
Placed on keflex as culture of wound is + GBS S to all pcn's and ampicillin.

## 2018-06-05 ENCOUNTER — Encounter: Payer: Self-pay | Admitting: *Deleted

## 2018-06-05 ENCOUNTER — Encounter: Payer: Medicaid Other | Admitting: Obstetrics and Gynecology

## 2018-06-13 ENCOUNTER — Encounter: Payer: Medicaid Other | Admitting: Obstetrics & Gynecology

## 2018-07-12 ENCOUNTER — Ambulatory Visit (INDEPENDENT_AMBULATORY_CARE_PROVIDER_SITE_OTHER): Payer: Medicaid Other | Admitting: Women's Health

## 2018-07-12 ENCOUNTER — Encounter: Payer: Self-pay | Admitting: Women's Health

## 2018-07-12 DIAGNOSIS — Z113 Encounter for screening for infections with a predominantly sexual mode of transmission: Secondary | ICD-10-CM

## 2018-07-12 DIAGNOSIS — I10 Essential (primary) hypertension: Secondary | ICD-10-CM

## 2018-07-12 DIAGNOSIS — O1490 Unspecified pre-eclampsia, unspecified trimester: Secondary | ICD-10-CM | POA: Insufficient documentation

## 2018-07-12 DIAGNOSIS — Z98891 History of uterine scar from previous surgery: Secondary | ICD-10-CM

## 2018-07-12 NOTE — Patient Instructions (Addendum)
No sex for 2 weeks before you come get the nexplanon
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
## 2018-07-16 LAB — GC/CHLAMYDIA PROBE AMP
CHLAMYDIA, DNA PROBE: NEGATIVE
NEISSERIA GONORRHOEAE BY PCR: NEGATIVE

## 2018-08-02 ENCOUNTER — Encounter: Payer: Medicaid Other | Admitting: Women's Health

## 2018-08-09 ENCOUNTER — Emergency Department (HOSPITAL_COMMUNITY): Payer: Medicaid Other

## 2018-08-09 ENCOUNTER — Emergency Department (HOSPITAL_COMMUNITY)
Admission: EM | Admit: 2018-08-09 | Discharge: 2018-08-09 | Disposition: A | Discharge status: Other Acute Inpatient Hospital | Payer: Medicaid Other | Attending: Emergency Medicine | Admitting: Emergency Medicine

## 2018-08-09 ENCOUNTER — Other Ambulatory Visit: Payer: Self-pay

## 2018-08-09 ENCOUNTER — Encounter (HOSPITAL_COMMUNITY): Payer: Self-pay

## 2018-08-09 DIAGNOSIS — S0990XA Unspecified injury of head, initial encounter: Secondary | ICD-10-CM | POA: Diagnosis not present

## 2018-08-09 DIAGNOSIS — Y939 Activity, unspecified: Secondary | ICD-10-CM | POA: Insufficient documentation

## 2018-08-09 DIAGNOSIS — Z049 Encounter for examination and observation for unspecified reason: Secondary | ICD-10-CM | POA: Diagnosis not present

## 2018-08-09 DIAGNOSIS — S060X0A Concussion without loss of consciousness, initial encounter: Secondary | ICD-10-CM | POA: Diagnosis not present

## 2018-08-09 DIAGNOSIS — F17210 Nicotine dependence, cigarettes, uncomplicated: Secondary | ICD-10-CM | POA: Insufficient documentation

## 2018-08-09 DIAGNOSIS — Z79899 Other long term (current) drug therapy: Secondary | ICD-10-CM | POA: Diagnosis not present

## 2018-08-09 DIAGNOSIS — F1910 Other psychoactive substance abuse, uncomplicated: Secondary | ICD-10-CM | POA: Insufficient documentation

## 2018-08-09 DIAGNOSIS — Y999 Unspecified external cause status: Secondary | ICD-10-CM | POA: Insufficient documentation

## 2018-08-09 DIAGNOSIS — S0081XA Abrasion of other part of head, initial encounter: Secondary | ICD-10-CM | POA: Diagnosis not present

## 2018-08-09 DIAGNOSIS — S199XXA Unspecified injury of neck, initial encounter: Secondary | ICD-10-CM | POA: Diagnosis not present

## 2018-08-09 DIAGNOSIS — Y929 Unspecified place or not applicable: Secondary | ICD-10-CM | POA: Insufficient documentation

## 2018-08-09 DIAGNOSIS — Z7982 Long term (current) use of aspirin: Secondary | ICD-10-CM | POA: Insufficient documentation

## 2018-08-09 DIAGNOSIS — S02831A Fracture of medial orbital wall, right side, initial encounter for closed fracture: Secondary | ICD-10-CM | POA: Diagnosis not present

## 2018-08-09 DIAGNOSIS — S0285XA Fracture of orbit, unspecified, initial encounter for closed fracture: Secondary | ICD-10-CM | POA: Diagnosis not present

## 2018-08-09 DIAGNOSIS — S0993XA Unspecified injury of face, initial encounter: Secondary | ICD-10-CM | POA: Diagnosis not present

## 2018-08-09 DIAGNOSIS — Z23 Encounter for immunization: Secondary | ICD-10-CM | POA: Diagnosis not present

## 2018-08-09 DIAGNOSIS — S01411A Laceration without foreign body of right cheek and temporomandibular area, initial encounter: Secondary | ICD-10-CM | POA: Diagnosis not present

## 2018-08-09 DIAGNOSIS — F419 Anxiety disorder, unspecified: Secondary | ICD-10-CM | POA: Diagnosis not present

## 2018-08-09 DIAGNOSIS — S00211A Abrasion of right eyelid and periocular area, initial encounter: Secondary | ICD-10-CM | POA: Diagnosis not present

## 2018-08-09 LAB — COMPREHENSIVE METABOLIC PANEL
ALT: 8 U/L (ref 0–44)
AST: 15 U/L (ref 15–41)
Albumin: 4.1 g/dL (ref 3.5–5.0)
Alkaline Phosphatase: 75 U/L (ref 38–126)
Anion gap: 8 (ref 5–15)
BUN: 9 mg/dL (ref 6–20)
CHLORIDE: 105 mmol/L (ref 98–111)
CO2: 26 mmol/L (ref 22–32)
CREATININE: 0.78 mg/dL (ref 0.44–1.00)
Calcium: 9 mg/dL (ref 8.9–10.3)
GFR calc non Af Amer: >60 mL/min (ref >60)
Glucose, Bld: 98 mg/dL (ref 70–99)
POTASSIUM: 3.7 mmol/L (ref 3.5–5.1)
Sodium: 139 mmol/L (ref 135–145)
Total Bilirubin: 0.3 mg/dL (ref 0.3–1.2)
Total Protein: 7.5 g/dL (ref 6.5–8.1)

## 2018-08-09 LAB — RAPID URINE DRUG SCREEN, HOSP PERFORMED
AMPHETAMINES: NOT DETECTED
BARBITURATES: NOT DETECTED
BENZODIAZEPINES: NOT DETECTED
Cocaine: POSITIVE — AB
Opiates: NOT DETECTED
TETRAHYDROCANNABINOL: POSITIVE — AB

## 2018-08-09 LAB — I-STAT CHEM 8, ED
BUN: 10 mg/dL (ref 6–20)
Calcium, Ion: 1.17 mmol/L (ref 1.15–1.40)
Chloride: 101 mmol/L (ref 98–111)
Creatinine, Ser: 0.8 mg/dL (ref 0.44–1.00)
GLUCOSE: 101 mg/dL — AB (ref 70–99)
HCT: 36 % (ref 36.0–46.0)
HEMOGLOBIN: 12.2 g/dL (ref 12.0–15.0)
POTASSIUM: 3.4 mmol/L — AB (ref 3.5–5.1)
Sodium: 139 mmol/L (ref 135–145)
TCO2: 26 mmol/L (ref 22–32)

## 2018-08-09 LAB — CBC WITH DIFFERENTIAL/PLATELET
ABS IMMATURE GRANULOCYTES: 0.04 10*3/uL (ref 0.00–0.07)
Basophils Absolute: 0 10*3/uL (ref 0.0–0.1)
Basophils Relative: 0 %
Eosinophils Absolute: 0.2 10*3/uL (ref 0.0–0.5)
Eosinophils Relative: 2 %
HEMATOCRIT: 40 % (ref 36.0–46.0)
HEMOGLOBIN: 11.8 g/dL — AB (ref 12.0–15.0)
Immature Granulocytes: 0 %
LYMPHS ABS: 1.4 10*3/uL (ref 0.7–4.0)
LYMPHS PCT: 14 %
MCH: 25.2 pg — ABNORMAL LOW (ref 26.0–34.0)
MCHC: 29.5 g/dL — ABNORMAL LOW (ref 30.0–36.0)
MCV: 85.5 fL (ref 80.0–100.0)
MONO ABS: 0.7 10*3/uL (ref 0.1–1.0)
MONOS PCT: 7 %
NEUTROS ABS: 7.4 10*3/uL (ref 1.7–7.7)
NRBC: 0 % (ref 0.0–0.2)
Neutrophils Relative %: 77 %
PLATELETS: 278 10*3/uL (ref 150–400)
RBC: 4.68 MIL/uL (ref 3.87–5.11)
RDW: 14.5 % (ref 11.5–15.5)
WBC: 9.7 10*3/uL (ref 4.0–10.5)

## 2018-08-09 LAB — I-STAT BETA HCG BLOOD, ED (MC, WL, AP ONLY)

## 2018-08-09 LAB — ETHANOL: Alcohol, Ethyl (B): <10 mg/dL (ref <10)

## 2018-08-09 MED ORDER — LIDOCAINE-EPINEPHRINE-TETRACAINE (LET) SOLUTION
3.0000 mL | Freq: Once | NASAL | Status: AC
  Administered 2018-08-09: 3 mL via TOPICAL

## 2018-08-09 MED ORDER — TETANUS-DIPHTH-ACELL PERTUSSIS 5-2.5-18.5 LF-MCG/0.5 IM SUSP
0.5000 mL | Freq: Once | INTRAMUSCULAR | Status: AC
  Administered 2018-08-09: 0.5 mL via INTRAMUSCULAR
  Filled 2018-08-09: qty 0.5

## 2018-08-09 MED ORDER — ONDANSETRON HCL 4 MG/2ML IJ SOLN
4.0000 mg | Freq: Once | INTRAMUSCULAR | Status: AC
  Administered 2018-08-09: 4 mg via INTRAVENOUS
  Filled 2018-08-09: qty 2

## 2018-08-09 MED ORDER — LIDOCAINE-EPINEPHRINE-TETRACAINE (LET) SOLUTION
NASAL | Status: AC
  Administered 2018-08-09: 3 mL via TOPICAL
  Filled 2018-08-09: qty 3

## 2018-08-09 MED ORDER — LIDOCAINE HCL (PF) 2 % IJ SOLN
10.0000 mL | Freq: Once | INTRAMUSCULAR | Status: AC
  Administered 2018-08-09: 10 mL

## 2018-08-09 MED ORDER — POVIDONE-IODINE 10 % EX SOLN
CUTANEOUS | Status: DC | PRN
  Administered 2018-08-09: 13:00:00 via TOPICAL
  Filled 2018-08-09: qty 30

## 2018-08-09 MED ORDER — LIDOCAINE HCL (PF) 2 % IJ SOLN
INTRAMUSCULAR | Status: AC
  Filled 2018-08-09: qty 10
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
## 2018-08-09 NOTE — ED Notes (Signed)
Beeped ophthamology through [facility]
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
## 2018-08-09 NOTE — ED Provider Notes (Signed)
Pt with laceration, superficial, 1 cm but not well approximated right cheek.  Attempted LET for local anesthetic which was not successful.  Returned with lidocaine for injection but was unable to wake patient long enough for consent or for safe injection and laceration repair.  She would flinch to pinprick but would not stay awake.  Will defer definitive suture care at this time.   Burgess Amor, PA-C 08/09/18 1218    Doug Sou, MD 08/09/18 [phone]

## 2018-08-09 NOTE — ED Notes (Addendum)
Walked in patient's room and saw her in the process of getting out of the bed very quickly. Patient grabbed her bedpan that was on the sink. I told pt to sit on the bed to avoid falling and that I would put the bedpan under her. She refused to sit down and instead urinated into the bedpan squatting in the floor. Urine went all on the floor and all over the patient. Vicky helped me get patient back into bed and changed pt's clothing. Patient resting now back in bed with side rails up. No fall occurred. Opened patient's blinds to watch pt more closely.

## 2018-09-02 ENCOUNTER — Ambulatory Visit: Payer: Medicaid Other | Admitting: Obstetrics & Gynecology

## 2019-02-19 ENCOUNTER — Ambulatory Visit: Payer: Medicaid Other | Admitting: Adult Health

## 2019-02-21 ENCOUNTER — Other Ambulatory Visit: Payer: Self-pay

## 2019-02-21 ENCOUNTER — Ambulatory Visit (INDEPENDENT_AMBULATORY_CARE_PROVIDER_SITE_OTHER): Payer: Medicaid Other | Admitting: Adult Health

## 2019-02-21 ENCOUNTER — Encounter: Payer: Self-pay | Admitting: Adult Health

## 2019-02-21 DIAGNOSIS — O3680X0 Pregnancy with inconclusive fetal viability, not applicable or unspecified: Secondary | ICD-10-CM | POA: Diagnosis not present

## 2019-02-21 DIAGNOSIS — Z3201 Encounter for pregnancy test, result positive: Secondary | ICD-10-CM | POA: Insufficient documentation

## 2019-02-21 DIAGNOSIS — F1910 Other psychoactive substance abuse, uncomplicated: Secondary | ICD-10-CM | POA: Diagnosis not present

## 2019-02-21 DIAGNOSIS — Z3A10 10 weeks gestation of pregnancy: Secondary | ICD-10-CM

## 2019-02-21 MED ORDER — PRENATAL PLUS 27-1 MG PO TABS: 1.0000 | ORAL_TABLET | Freq: Every day | ORAL | 12 refills | Status: DC

## 2019-02-21 MED ORDER — PROMETHAZINE HCL 25 MG PO TABS: 25.0000 mg | ORAL_TABLET | Freq: Four times a day (QID) | ORAL | 1 refills | Status: DC | PRN

## 2019-02-21 NOTE — Progress Notes (Signed)
Patient ID: Hannah Ryan, female   DOB: 01-20-1991, 28 y.o.   MRN: [id number]   TELEHEALTH VIRTUAL GYNECOLOGY VISIT ENCOUNTER NOTE  I connected with [name] on 02/21/19 at 11:15 AM EDT by telephone at home and verified that I am speaking with the correct person using two identifiers.   I discussed the limitations, risks, security and privacy concerns of performing an evaluation and management service by telephone and the availability of in person appointments. I also discussed with the patient that there may be a patient responsible charge related to this service. The patient expressed understanding and agreed to proceed.   History:  Hannah Ryan is a 28 y.o. G79P1001 female being evaluated today for missed periods and +HPTs, is about 10 weeks by LMP 12/13/2018, with EDD 09/19/2019.She has had some spotting.She did Coke and opioids the end of March, non since, but has smoked POT,helps with nausea.  She denies any abnormal vaginal discharge, pelvic pain or other concerns.       Past Medical History:  Diagnosis Date  . Anxiety    per patient/family  . Chlamydia   . Depression    per patient/family  . Gonorrhea   . Obesity   . Trichomonas infection    Past Surgical History:  Procedure Laterality Date  . ADENOIDECTOMY    . CESAREAN SECTION    . FOOT SURGERY     left  . IRRIGATION AND DEBRIDEMENT ABSCESS  01/08/2012   Procedure: IRRIGATION AND DEBRIDEMENT ABSCESS;  Surgeon: Fabio Bering, MD;  Location: [facility];  Service: General;  Laterality: Right;  . TONSILLECTOMY     The following portions of the patient's history were reviewed and updated as appropriate: allergies, current medications, past family history, past medical history, past social history, past surgical history and problem list.   Health Maintenance:  Normal pap 11/20/17.   Review of Systems:  Pertinent items noted in HPI and remainder of comprehensive ROS otherwise negative.  Physical Exam:   General:   Alert, oriented and cooperative.   Mental Status: Normal mood and affect perceived. Normal judgment and thought content.  Physical exam deferred due to nature of the encounter LMP 12/13/2018 (Exact Date)   Breastfeeding No per pt.  PHQ 2 score 1. No sex till no spotting for 7 days.   Labs and Imaging No results found for this or any previous visit (from the past 336 hour(s)). No results found.    Assessment and Plan:     1. Positive pregnancy test -+HPTs  2. [redacted] weeks gestation of pregnancy -Eat often Meds ordered this encounter  Medications  . prenatal vitamin w/FE, FA (PRENATAL 1 + 1) 27-1 MG TABS tablet    Sig: Take 1 tablet by mouth daily at 12 noon.    Dispense:  30 each    Refill:  12    Order Specific Question:   Supervising Provider    Answer:   Despina Hidden, LUTHER H [[id number]]  . promethazine (PHENERGAN) 25 MG tablet    Sig: Take 1 tablet (25 mg total) by mouth every 6 (six) hours as needed for nausea or vomiting.    Dispense:  30 tablet    Refill:  1    Order Specific Question:   Supervising Provider    Answer:   Despina Hidden, LUTHER H [[id number]]    3. Encounter to determine fetal viability of pregnancy, single or unspecified fetus - US OB Comp Less 14 Wks; Future  4. Polysubstance abuse (HCC) -trying to stop  the POT       I discussed the assessment and treatment plan with the patient. The patient was provided an opportunity to ask questions and all were answered. The patient agreed with the plan and demonstrated an understanding of the instructions.   The patient was advised to call back or seek an in-person evaluation/go to the ED if the symptoms worsen or if the condition fails to improve as anticipated.  I provided 7 minutes of non-face-to-face time during this encounter.   Cyril MourningJennifer Griffin, NP [facility]

## 2019-02-25 ENCOUNTER — Other Ambulatory Visit: Payer: Medicaid Other

## 2019-02-26 ENCOUNTER — Ambulatory Visit (INDEPENDENT_AMBULATORY_CARE_PROVIDER_SITE_OTHER): Payer: Medicaid Other

## 2019-02-26 ENCOUNTER — Other Ambulatory Visit: Payer: Self-pay

## 2019-02-26 DIAGNOSIS — O3680X0 Pregnancy with inconclusive fetal viability, not applicable or unspecified: Secondary | ICD-10-CM

## 2019-02-26 DIAGNOSIS — Z3A10 10 weeks gestation of pregnancy: Secondary | ICD-10-CM | POA: Diagnosis not present

## 2019-02-26 NOTE — Progress Notes (Signed)
[place] 10+5 wks,single IUP,crl 41.81 mm,normal ovaries bilat,fhr 173 bpm

## 2019-03-07 ENCOUNTER — Other Ambulatory Visit: Payer: Self-pay | Admitting: Obstetrics and Gynecology

## 2019-03-07 DIAGNOSIS — Z3682 Encounter for antenatal screening for nuchal translucency: Secondary | ICD-10-CM

## 2019-03-10 ENCOUNTER — Ambulatory Visit (INDEPENDENT_AMBULATORY_CARE_PROVIDER_SITE_OTHER): Payer: Medicaid Other

## 2019-03-10 ENCOUNTER — Other Ambulatory Visit: Payer: Medicaid Other

## 2019-03-10 ENCOUNTER — Other Ambulatory Visit: Payer: Self-pay

## 2019-03-10 ENCOUNTER — Encounter: Payer: Self-pay | Admitting: *Deleted

## 2019-03-10 DIAGNOSIS — Z3682 Encounter for antenatal screening for nuchal translucency: Secondary | ICD-10-CM | POA: Diagnosis not present

## 2019-03-10 DIAGNOSIS — Z3A12 12 weeks gestation of pregnancy: Secondary | ICD-10-CM | POA: Diagnosis not present

## 2019-03-10 NOTE — Progress Notes (Signed)
[place] 12+3 wks,measurements c/w dates,crl 62.95 mm,fhr 164 bpm,normal ovaries bilat,NB present,NT 1.5 mm

## 2019-03-10 NOTE — Progress Notes (Signed)
Integrated 1 done. JSY

## 2019-03-11 ENCOUNTER — Ambulatory Visit: Payer: Medicaid Other | Admitting: *Deleted

## 2019-03-11 ENCOUNTER — Ambulatory Visit (INDEPENDENT_AMBULATORY_CARE_PROVIDER_SITE_OTHER): Payer: Medicaid Other | Admitting: Women's Health

## 2019-03-11 ENCOUNTER — Encounter: Payer: Self-pay | Admitting: Women's Health

## 2019-03-11 VITALS — BP 126/82 | HR 97 | Wt 375.0 lb

## 2019-03-11 DIAGNOSIS — O0991 Supervision of high risk pregnancy, unspecified, first trimester: Secondary | ICD-10-CM

## 2019-03-11 DIAGNOSIS — O9A311 Physical abuse complicating pregnancy, first trimester: Secondary | ICD-10-CM | POA: Diagnosis not present

## 2019-03-11 DIAGNOSIS — O10919 Unspecified pre-existing hypertension complicating pregnancy, unspecified trimester: Secondary | ICD-10-CM

## 2019-03-11 DIAGNOSIS — O10911 Unspecified pre-existing hypertension complicating pregnancy, first trimester: Secondary | ICD-10-CM | POA: Diagnosis not present

## 2019-03-11 DIAGNOSIS — O9A319 Physical abuse complicating pregnancy, unspecified trimester: Secondary | ICD-10-CM | POA: Insufficient documentation

## 2019-03-11 DIAGNOSIS — Z3481 Encounter for supervision of other normal pregnancy, first trimester: Secondary | ICD-10-CM | POA: Diagnosis not present

## 2019-03-11 DIAGNOSIS — F17200 Nicotine dependence, unspecified, uncomplicated: Secondary | ICD-10-CM | POA: Insufficient documentation

## 2019-03-11 DIAGNOSIS — I10 Essential (primary) hypertension: Secondary | ICD-10-CM

## 2019-03-11 DIAGNOSIS — O09299 Supervision of pregnancy with other poor reproductive or obstetric history, unspecified trimester: Secondary | ICD-10-CM | POA: Diagnosis not present

## 2019-03-11 DIAGNOSIS — O09291 Supervision of pregnancy with other poor reproductive or obstetric history, first trimester: Secondary | ICD-10-CM | POA: Diagnosis not present

## 2019-03-11 DIAGNOSIS — Z3A12 12 weeks gestation of pregnancy: Secondary | ICD-10-CM

## 2019-03-11 DIAGNOSIS — Z363 Encounter for antenatal screening for malformations: Secondary | ICD-10-CM

## 2019-03-11 DIAGNOSIS — F1290 Cannabis use, unspecified, uncomplicated: Secondary | ICD-10-CM | POA: Insufficient documentation

## 2019-03-11 MED ORDER — BLOOD PRESSURE MONITOR MISC: 0 refills | Status: DC

## 2019-03-11 MED ORDER — ASPIRIN 81 MG PO TABS: 162.0000 mg | ORAL_TABLET | Freq: Every day | ORAL | 6 refills | Status: DC

## 2019-03-11 NOTE — Patient Instructions (Addendum)
Hannah Ryan, I greatly value your feedback.  If you receive a survey following your visit with [place] today, we appreciate you taking the time to fill it out.  Thanks, Hannah Ryan, CNM, WHNP-BC  [employer]  [phone]  [facility] HAS MOVED!!! It is now [facility] ([address] [place], [place] [address]) Entrance located off of [address] Free 24/7 valet parking   Begin taking  (two  tablets) baby aspirin daily to decrease risk of preeclampsia during pregnancy    Home Blood Pressure Monitoring for Patients   Your provider has recommended that you check your blood pressure (BP) at least once a week at home. If you do not have a blood pressure cuff at home, one will be provided for you. Contact your provider if you have not received your monitor within 1 week.   Helpful Tips for Accurate Home Blood Pressure Checks  . Don't smoke, exercise, or drink caffeine 30 minutes before checking your BP . Use the restroom before checking your BP (a full bladder can raise your pressure) . Relax in a comfortable upright chair . Feet on the ground . Left arm resting comfortably on a flat surface at the level of your heart . Legs uncrossed . Back supported . Sit quietly and don't talk . Place the cuff on your bare arm . Adjust snuggly, so that only two fingertips can fit between your skin and the top of the cuff . Check 2 readings separated by at least one minute . Keep a log of your BP readings . For a visual, please reference this diagram: http://ccnc.care/bpdiagram  Provider Name: [facility]     Phone: [phone]  Zone 1: ALL CLEAR  Continue to monitor your symptoms:  . BP reading is less than 140 (top number) or less than 90 (bottom number)  . No right upper stomach pain . No headaches or seeing spots . No feeling nauseated or throwing up . No swelling in face and hands  Zone 2: CAUTION Call your doctor's office for any of the  following:  . BP reading is greater than 140 (top number) or greater than 90 (bottom number)  . Stomach pain under your ribs in the middle or right side . Headaches or seeing spots . Feeling nauseated or throwing up . Swelling in face and hands  Zone 3: EMERGENCY  Seek immediate medical care if you have any of the following:  . BP reading is greater than160 (top number) or greater than 110 (bottom number) . Severe headaches not improving with Tylenol . Serious difficulty catching your breath . Any worsening symptoms from Zone 2     Nausea & Vomiting  Have saltine crackers or pretzels by your bed and eat a few bites before you raise your head out of bed in the morning  Eat small frequent meals throughout the day instead of large meals  Drink plenty of fluids throughout the day to stay hydrated, just don't drink a lot of fluids with your meals.  This can make your stomach fill up faster making you feel sick  Do not brush your teeth right after you eat  Products with real ginger are good for nausea, like ginger ale and ginger hard candy Make sure it says made with real ginger!  Sucking on sour candy like lemon heads is also good for nausea  If your prenatal vitamins make you nauseated, take them at night so you will sleep  through the nausea  Sea Bands  If you feel like you need medicine for the nausea & vomiting please let us know  If you are unable to keep any fluids or food down please let us know   Constipation  Drink plenty of fluid, preferably water, throughout the day  Eat foods high in fiber such as fruits, vegetables, and grains  Exercise, such as walking, is a good way to keep your bowels regular  Drink warm fluids, especially warm prune juice, or decaf coffee  Eat a 1/2 cup of real oatmeal (not instant), 1/2 cup applesauce, and 1/2-1 cup warm prune juice every day  If needed, you may take Colace (docusate sodium) stool softener once or twice a day to help keep  the stool soft. If you are pregnant, wait until you are out of your first trimester (12-14 weeks of pregnancy)  If you still are having problems with constipation, you may take Miralax once daily as needed to help keep your bowels regular.  If you are pregnant, wait until you are out of your first trimester (12-14 weeks of pregnancy)   First Trimester of Pregnancy The first trimester of pregnancy is from week 1 until the end of week 12 (months 1 through 3). A week after a sperm fertilizes an egg, the egg will implant on the wall of the uterus. This embryo will begin to develop into a baby. Genes from you and your partner are forming the baby. The female genes determine whether the baby is a boy or a girl. At 6-8 weeks, the eyes and face are formed, and the heartbeat can be seen on ultrasound. At the end of 12 weeks, all the baby's organs are formed.  Now that you are pregnant, you will want to do everything you can to have a healthy baby. Two of the most important things are to get good prenatal care and to follow your health care provider's instructions. Prenatal care is all the medical care you receive before the baby's birth. This care will help prevent, find, and treat any problems during the pregnancy and childbirth. BODY CHANGES Your body goes through many changes during pregnancy. The changes vary from woman to woman.   You may gain or lose a couple of pounds at first.  You may feel sick to your stomach (nauseous) and throw up (vomit). If the vomiting is uncontrollable, call your health care provider.  You may tire easily.  You may develop headaches that can be relieved by medicines approved by your health care provider.  You may urinate more often. Painful urination may mean you have a bladder infection.  You may develop heartburn as a result of your pregnancy.  You may develop constipation because certain hormones are causing the muscles that push waste through your intestines to slow  down.  You may develop hemorrhoids or swollen, bulging veins (varicose veins).  Your breasts may begin to grow larger and become tender. Your nipples may stick out more, and the tissue that surrounds them (areola) may become darker.  Your gums may bleed and may be sensitive to brushing and flossing.  Dark spots or blotches (chloasma, mask of pregnancy) may develop on your face. This will likely fade after the baby is born.  Your menstrual periods will stop.  You may have a loss of appetite.  You may develop cravings for certain kinds of food.  You may have changes in your emotions from day to day, such as being excited to  be pregnant or being concerned that something may go wrong with the pregnancy and baby.  You may have more vivid and strange dreams.  You may have changes in your hair. These can include thickening of your hair, rapid growth, and changes in texture. Some women also have hair loss during or after pregnancy, or hair that feels dry or thin. Your hair will most likely return to normal after your baby is born. WHAT TO EXPECT AT YOUR PRENATAL VISITS During a routine prenatal visit:  You will be weighed to make sure you and the baby are growing normally.  Your blood pressure will be taken.  Your abdomen will be measured to track your baby's growth.  The fetal heartbeat will be listened to starting around week 10 or 12 of your pregnancy.  Test results from any previous visits will be discussed. Your health care provider may ask you:  How you are feeling.  If you are feeling the baby move.  If you have had any abnormal symptoms, such as leaking fluid, bleeding, severe headaches, or abdominal cramping.  If you have any questions. Other tests that may be performed during your first trimester include:  Blood tests to find your blood type and to check for the presence of any previous infections. They will also be used to check for low iron levels (anemia) and Rh  antibodies. Later in the pregnancy, blood tests for diabetes will be done along with other tests if problems develop.  Urine tests to check for infections, diabetes, or protein in the urine.  An ultrasound to confirm the proper growth and development of the baby.  An amniocentesis to check for possible genetic problems.  Fetal screens for spina bifida and Down syndrome.  You may need other tests to make sure you and the baby are doing well. HOME CARE INSTRUCTIONS  Medicines  Follow your health care provider's instructions regarding medicine use. Specific medicines may be either safe or unsafe to take during pregnancy.  Take your prenatal vitamins as directed.  If you develop constipation, try taking a stool softener if your health care provider approves. Diet  Eat regular, well-balanced meals. Choose a variety of foods, such as meat or vegetable-based protein, fish, milk and low-fat dairy products, vegetables, fruits, and whole grain breads and cereals. Your health care provider will help you determine the amount of weight gain that is right for you.  Avoid raw meat and uncooked cheese. These carry germs that can cause birth defects in the baby.  Eating four or five small meals rather than three large meals a day may help relieve nausea and vomiting. If you start to feel nauseous, eating a few soda crackers can be helpful. Drinking liquids between meals instead of during meals also seems to help nausea and vomiting.  If you develop constipation, eat more high-fiber foods, such as fresh vegetables or fruit and whole grains. Drink enough fluids to keep your urine clear or pale yellow. Activity and Exercise  Exercise only as directed by your health care provider. Exercising will help you:  Control your weight.  Stay in shape.  Be prepared for labor and delivery.  Experiencing pain or cramping in the lower abdomen or low back is a good sign that you should stop exercising. Check  with your health care provider before continuing normal exercises.  Try to avoid standing for long periods of time. Move your legs often if you must stand in one place for a long time.  Avoid  heavy lifting.  Wear low-heeled shoes, and practice good posture.  You may continue to have sex unless your health care provider directs you otherwise. Relief of Pain or Discomfort  Wear a good support bra for breast tenderness.    Take warm sitz baths to soothe any pain or discomfort caused by hemorrhoids. Use hemorrhoid cream if your health care provider approves.    Rest with your legs elevated if you have leg cramps or low back pain.  If you develop varicose veins in your legs, wear support hose. Elevate your feet for 15 minutes, 3-4 times a day. Limit salt in your diet. Prenatal Care  Schedule your prenatal visits by the twelfth week of pregnancy. They are usually scheduled monthly at first, then more often in the last 2 months before delivery.  Write down your questions. Take them to your prenatal visits.  Keep all your prenatal visits as directed by your health care provider. Safety  Wear your seat belt at all times when driving.  Make a list of emergency phone numbers, including numbers for family, friends, the hospital, and police and fire departments. General Tips  Ask your health care provider for a referral to a local prenatal education class. Begin classes no later than at the beginning of month 6 of your pregnancy.  Ask for help if you have counseling or nutritional needs during pregnancy. Your health care provider can offer advice or refer you to specialists for help with various needs.  Do not use hot tubs, steam rooms, or saunas.  Do not douche or use tampons or scented sanitary pads.  Do not cross your legs for long periods of time.  Avoid cat litter boxes and soil used by cats. These carry germs that can cause birth defects in the baby and possibly loss of the fetus  by miscarriage or stillbirth.  Avoid all smoking, herbs, alcohol, and medicines not prescribed by your health care provider. Chemicals in these affect the formation and growth of the baby.  Schedule a dentist appointment. At home, brush your teeth with a soft toothbrush and be gentle when you floss. SEEK MEDICAL CARE IF:   You have dizziness.  You have mild pelvic cramps, pelvic pressure, or nagging pain in the abdominal area.  You have persistent nausea, vomiting, or diarrhea.  You have a bad smelling vaginal discharge.  You have pain with urination.  You notice increased swelling in your face, hands, legs, or ankles. SEEK IMMEDIATE MEDICAL CARE IF:   You have a fever.  You are leaking fluid from your vagina.  You have spotting or bleeding from your vagina.  You have severe abdominal cramping or pain.  You have rapid weight gain or loss.  You vomit blood or material that looks like coffee grounds.  You are exposed to [place] measles and have never had them.  You are exposed to fifth disease or chickenpox.  You develop a severe headache.  You have shortness of breath.  You have any kind of trauma, such as from a fall or a car accident. Document Released: 10/03/2001 Document Revised: 02/23/2014 Document Reviewed: 08/19/2013 [facility] Patient Information 2015 [place], [place]. This information is not intended to replace advice given to you by your health care provider. Make sure you discuss any questions you have with your health care provider.   Coronavirus (COVID-19) Are you at risk?  Are you at risk for the Coronavirus (COVID-19)?  To be considered HIGH RISK for Coronavirus (COVID-19), you have to meet  the following criteria:  . Traveled to [place], [place], [place], [place] or [place]; or in the [place] to [place], [place], [place], or [place]; and have fever, cough, and shortness of breath within the last 2 weeks of travel OR . Been in close contact with  a person diagnosed with COVID-19 within the last 2 weeks and have fever, cough, and shortness of breath . IF YOU DO NOT MEET THESE CRITERIA, YOU ARE CONSIDERED LOW RISK FOR COVID-19.  What to do if you are HIGH RISK for COVID-19?  Marland Kitchen If you are having a medical emergency, call 911. . Seek medical care right away. Before you go to a doctor's office, urgent care or emergency department, call ahead and tell them about your recent travel, contact with someone diagnosed with COVID-19, and your symptoms. You should receive instructions from your physician's office regarding next steps of care.  . When you arrive at healthcare provider, tell the healthcare staff immediately you have returned from visiting [place], [place], [place], [place] or [place]; or traveled in the [place] to [place], [place], [place], or [place]; in the last two weeks or you have been in close contact with a person diagnosed with COVID-19 in the last 2 weeks.   . Tell the health care staff about your symptoms: fever, cough and shortness of breath. . After you have been seen by a medical provider, you will be either: o Tested for (COVID-19) and discharged home on quarantine except to seek medical care if symptoms worsen, and asked to  - Stay home and avoid contact with others until you get your results (4-5 days)  - Avoid travel on public transportation if possible (such as bus, train, or airplane) or o Sent to the Emergency Department by EMS for evaluation, COVID-19 testing, and possible admission depending on your condition and test results.  What to do if you are LOW RISK for COVID-19?  Reduce your risk of any infection by using the same precautions used for avoiding the common cold or flu:  Marland Kitchen Wash your hands often with soap and warm water for at least 20 seconds.  If soap and water are not readily available, use an alcohol-based hand sanitizer with at least 60% alcohol.  . If coughing or sneezing, cover your mouth and  nose by coughing or sneezing into the elbow areas of your shirt or coat, into a tissue or into your sleeve (not your hands). . Avoid shaking hands with others and consider head nods or verbal greetings only. . Avoid touching your eyes, nose, or mouth with unwashed hands.  . Avoid close contact with people who are sick. . Avoid places or events with large numbers of people in one location, like concerts or sporting events. . Carefully consider travel plans you have or are making. . If you are planning any travel outside or inside the [place], visit the CDC's Travelers' Health webpage for the latest health notices. . If you have some symptoms but not all symptoms, continue to monitor at home and seek medical attention if your symptoms worsen. . If you are having a medical emergency, call 911.   ADDITIONAL HEALTHCARE OPTIONS FOR PATIENTS  [place] [facility] / e-Visit: [email or web address]         [facility]: [phone]  [facility]: [phone]                   [facility]: [phone]
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
## 2019-03-12 LAB — OBSTETRIC PANEL, INCLUDING HIV
Antibody Screen: NEGATIVE
Basophils Absolute: 0 10*3/uL (ref 0.0–0.2)
Basos: 0 %
EOS (ABSOLUTE): 0.3 10*3/uL (ref 0.0–0.4)
Eos: 6 %
HIV Screen 4th Generation wRfx: NONREACTIVE
Hematocrit: 35.7 % (ref 34.0–46.6)
Hemoglobin: 11.6 g/dL (ref 11.1–15.9)
Hepatitis B Surface Ag: NEGATIVE
Immature Grans (Abs): 0 10*3/uL (ref 0.0–0.1)
Immature Granulocytes: 0 %
Lymphocytes Absolute: 2 10*3/uL (ref 0.7–3.1)
Lymphs: 34 %
MCH: 25.8 pg — ABNORMAL LOW (ref 26.6–33.0)
MCHC: 32.5 g/dL (ref 31.5–35.7)
MCV: 79 fL (ref 79–97)
Monocytes Absolute: 0.4 10*3/uL (ref 0.1–0.9)
Monocytes: 7 %
Neutrophils Absolute: 3.1 10*3/uL (ref 1.4–7.0)
Neutrophils: 53 %
Platelets: 239 10*3/uL (ref 150–450)
RBC: 4.5 x10E6/uL (ref 3.77–5.28)
RDW: 14.9 % (ref 11.7–15.4)
RPR Ser Ql: NONREACTIVE
Rh Factor: POSITIVE
Rubella Antibodies, IGG: 3.87 index (ref >0.99)
WBC: 5.8 10*3/uL (ref 3.4–10.8)

## 2019-03-12 LAB — URINALYSIS
Bilirubin, UA: NEGATIVE
Glucose, UA: NEGATIVE
Ketones, UA: NEGATIVE
Nitrite, UA: NEGATIVE
RBC, UA: NEGATIVE
Specific Gravity, UA: 1.013 (ref 1.005–1.030)
Urobilinogen, Ur: 0.2 mg/dL (ref 0.2–1.0)
pH, UA: 6 (ref 5.0–7.5)

## 2019-03-12 LAB — PROTEIN / CREATININE RATIO, URINE
Creatinine, Urine: 82.4 mg/dL
Protein, Ur: 17.3 mg/dL
Protein/Creat Ratio: 210 mg/g creat — ABNORMAL HIGH (ref 0–200)

## 2019-03-12 LAB — PMP SCREEN PROFILE (10S), URINE
Amphetamine Scrn, Ur: NEGATIVE ng/mL
BARBITURATE SCREEN URINE: NEGATIVE ng/mL
BENZODIAZEPINE SCREEN, URINE: POSITIVE ng/mL — AB
CANNABINOIDS UR QL SCN: POSITIVE ng/mL — AB
Cocaine (Metab) Scrn, Ur: POSITIVE ng/mL — AB
Creatinine(Crt), U: 96 mg/dL (ref 20.0–300.0)
Methadone Screen, Urine: NEGATIVE ng/mL
OXYCODONE+OXYMORPHONE UR QL SCN: POSITIVE ng/mL — AB
Opiate Scrn, Ur: NEGATIVE ng/mL
Ph of Urine: 6.1 (ref 4.5–8.9)
Phencyclidine Qn, Ur: NEGATIVE ng/mL
Propoxyphene Scrn, Ur: NEGATIVE ng/mL

## 2019-03-12 LAB — COMPREHENSIVE METABOLIC PANEL
ALT: 8 IU/L (ref 0–32)
AST: 13 IU/L (ref 0–40)
Albumin/Globulin Ratio: 1.9 (ref 1.2–2.2)
Albumin: 4.1 g/dL (ref 3.9–5.0)
Alkaline Phosphatase: 58 IU/L (ref 39–117)
BUN/Creatinine Ratio: 12 (ref 9–23)
BUN: 7 mg/dL (ref 6–20)
Bilirubin Total: <0.2 mg/dL (ref 0.0–1.2)
CO2: 23 mmol/L (ref 20–29)
Calcium: 9.2 mg/dL (ref 8.7–10.2)
Chloride: 106 mmol/L (ref 96–106)
Creatinine, Ser: 0.6 mg/dL (ref 0.57–1.00)
GFR calc Af Amer: 144 mL/min/{1.73_m2} (ref >59)
GFR calc non Af Amer: 125 mL/min/{1.73_m2} (ref >59)
Globulin, Total: 2.2 g/dL (ref 1.5–4.5)
Glucose: 80 mg/dL (ref 65–99)
Potassium: 4.2 mmol/L (ref 3.5–5.2)
Sodium: 143 mmol/L (ref 134–144)
Total Protein: 6.3 g/dL (ref 6.0–8.5)

## 2019-03-13 LAB — INTEGRATED 1
Crown Rump Length: 63 mm
Gest. Age on Collection Date: 12.6 weeks
Maternal Age at EDD: 28.4 yr
Nuchal Translucency (NT): 1.5 mm
Number of Fetuses: 1
PAPP-A Value: 575.3 ng/mL

## 2019-03-13 LAB — URINE CULTURE

## 2019-03-16 LAB — GC/CHLAMYDIA PROBE AMP
Chlamydia trachomatis, NAA: NEGATIVE
Neisseria Gonorrhoeae by PCR: NEGATIVE

## 2019-03-16 LAB — INHERITEST CORE(CF97,SMA,FRAX)

## 2019-03-17 ENCOUNTER — Encounter: Payer: Self-pay | Admitting: Women's Health

## 2019-03-17 DIAGNOSIS — O0990 Supervision of high risk pregnancy, unspecified, unspecified trimester: Secondary | ICD-10-CM | POA: Insufficient documentation

## 2019-03-26 LAB — INHERITEST CORE(CF97,SMA,FRAX)

## 2019-03-26 LAB — MATERNIT 21 PLUS CORE, BLOOD
Fetal Fraction: 5
Result (T21): NEGATIVE
Trisomy 13 (Patau syndrome): NEGATIVE
Trisomy 18 (Edwards syndrome): NEGATIVE
Trisomy 21 (Down syndrome): NEGATIVE

## 2019-04-03 ENCOUNTER — Other Ambulatory Visit: Payer: Self-pay | Admitting: Adult Health

## 2019-04-03 MED ORDER — PREPLUS 27-1 MG PO TABS: 1.0000 | ORAL_TABLET | Freq: Every day | ORAL | 12 refills | Status: DC

## 2019-04-03 NOTE — Progress Notes (Signed)
rx PrePlus

## 2019-04-15 ENCOUNTER — Encounter: Payer: Self-pay | Admitting: Obstetrics and Gynecology

## 2019-04-15 ENCOUNTER — Ambulatory Visit (INDEPENDENT_AMBULATORY_CARE_PROVIDER_SITE_OTHER): Payer: Medicaid Other | Admitting: Obstetrics and Gynecology

## 2019-04-15 ENCOUNTER — Telehealth: Payer: Self-pay | Admitting: Obstetrics and Gynecology

## 2019-04-15 ENCOUNTER — Ambulatory Visit: Payer: Medicaid Other

## 2019-04-15 ENCOUNTER — Other Ambulatory Visit: Payer: Self-pay

## 2019-04-15 VITALS — BP 145/98 | HR 97 | Wt 386.0 lb

## 2019-04-15 DIAGNOSIS — Z1389 Encounter for screening for other disorder: Secondary | ICD-10-CM

## 2019-04-15 DIAGNOSIS — O10919 Unspecified pre-existing hypertension complicating pregnancy, unspecified trimester: Secondary | ICD-10-CM

## 2019-04-15 DIAGNOSIS — Z3A17 17 weeks gestation of pregnancy: Secondary | ICD-10-CM

## 2019-04-15 DIAGNOSIS — O10912 Unspecified pre-existing hypertension complicating pregnancy, second trimester: Secondary | ICD-10-CM

## 2019-04-15 DIAGNOSIS — O0990 Supervision of high risk pregnancy, unspecified, unspecified trimester: Secondary | ICD-10-CM

## 2019-04-15 DIAGNOSIS — O0992 Supervision of high risk pregnancy, unspecified, second trimester: Secondary | ICD-10-CM

## 2019-04-15 DIAGNOSIS — Z331 Pregnant state, incidental: Secondary | ICD-10-CM

## 2019-04-15 LAB — POCT URINALYSIS DIPSTICK OB
Blood, UA: NEGATIVE
Glucose, UA: NEGATIVE
Ketones, UA: NEGATIVE
Nitrite, UA: NEGATIVE

## 2019-04-15 MED ORDER — LABETALOL HCL 200 MG PO TABS: 200.0000 mg | ORAL_TABLET | Freq: Two times a day (BID) | ORAL | 3 refills | Status: DC

## 2019-04-15 NOTE — Telephone Encounter (Signed)
Patient informed of labetalol twice a day prescription called in

## 2019-04-15 NOTE — Patient Instructions (Signed)
[facility]  . [facility], [place]   [address] [place], [place]  . [facility], Dr. Valetta Close  [address] [place] (beside [address]) [phone]  . [address] [place], [phone]

## 2019-04-15 NOTE — Addendum Note (Signed)
Addended by: Jonnie Kind on: 04/15/2019 04:18 PM   Modules accepted: Orders

## 2019-04-15 NOTE — Progress Notes (Addendum)
Patient ID: Lakechia Nay, female   DOB: 1991/01/28, 28 y.o.   MRN: [id number]    [facility] PREGNANCY VISIT Patient name: Hannah Ryan MRN [id number]  Date of birth: Jun 16, 1991 Chief Complaint:   High Risk Gestation (discuss UDS; + pressure)  History of Present Illness:   Hannah Ryan is a 28 y.o. G73P1001 female at [redacted]w[redacted]d with an Estimated Date of Delivery: 09/19/19 being seen today for ongoing management of a high-risk pregnancy complicated by chronic HTN and h/o severe pre eclampsia.  Today she reports no complaints. She has had a c section because of a ripped placenta. Before her pregnancy, she took hydros 10/day and percocet 5/day but stopped for one month when she found out she was pregnant. She relapsed but is interested in stopping drug use to protect the baby. Her first son was on Subutex for 15 days after birth.   Contractions: Not present. Vag. Bleeding: None.  Movement: Present. denies leaking of fluid.  Review of Systems:   Pertinent items are noted in HPI Denies abnormal vaginal discharge w/ itching/odor/irritation, headaches, visual changes, shortness of breath, chest pain, abdominal pain, severe nausea/vomiting, or problems with urination or bowel movements unless otherwise stated above. Pertinent History Reviewed:  Reviewed past medical,surgical, social, obstetrical and family history.  Reviewed problem list, medications and allergies. Physical Assessment:   Vitals:   04/15/19 1536 04/15/19 1540  BP: (!) 149/89 (!) 145/98  Pulse: (!) 103 97  Weight: (!) 386 lb (175.1 kg)   Body mass index is 58.69 kg/m.           Physical Examination:   General appearance: alert, well appearing, and in no distress and oriented to person, place, and time  Mental status: alert, oriented to person, place, and time, normal mood, behavior, speech, dress, motor activity, and thought processes, affect appropriate to mood  Skin: warm & dry   Extremities: Edema: Trace    Cardiovascular:  normal heart rate noted  Respiratory: normal respiratory effort, no distress  Abdomen: gravid, soft, non-tender  Pelvic: Cervical exam deferred         Fetal Status:     Movement: Present    Fetal Surveillance Testing today: none  Results for orders placed or performed in visit on 04/15/19 (from the past 24 hour(s))  POC Urinalysis Dipstick OB   Collection Time: 04/15/19  3:30 PM  Result Value Ref Range   Color, UA     Clarity, UA     Glucose, UA Negative Negative   Bilirubin, UA     Ketones, UA neg    Spec Grav, UA     Blood, UA neg    pH, UA     POC,PROTEIN,UA Trace Negative, Trace, Small (1+), Moderate (2+), Large (3+), 4+   Urobilinogen, UA     Nitrite, UA neg    Leukocytes, UA Trace (A) Negative   Appearance     Odor      Assessment & Plan:  1) High-risk pregnancy G2P1001 at [redacted]w[redacted]d with an Estimated Date of Delivery: 09/19/19   2) CHTN, no meds will initiate labetalol 200 mg twice daily  3) H/o severe pre-eclampsia   4) Previous c section, desires TOLAC  5) Smoker, discussed risks to fetus, infant, and herself and advised cessation at last visit  6) THC use, discussed potential long term effects to baby and advised cessation at last visit  Meds: No orders of the defined types were placed in this encounter.  Labs/procedures today: none  Treatment Plan:  F/U in 2 wks for US anatomy Given [facility] Follow-up: No follow-ups on file.  Orders Placed This Encounter  Procedures  . POC Urinalysis Dipstick OB   By signing my name below, I, Pietro CassisEmily Tufford, attest that this documentation has been prepared under the direction and in the presence of Tilda BurrowFerguson, Kalan Yeley V, MD. Electronically Signed: Pietro CassisEmily Tufford, Medical Scribe. 04/15/19. 4:01 PM.  I personally performed the services described in this documentation, which was SCRIBED in my presence. The recorded information has been reviewed and considered accurate. It has been edited as necessary  during review. Tilda BurrowJohn V Jantz Main, MD

## 2019-04-30 ENCOUNTER — Ambulatory Visit (INDEPENDENT_AMBULATORY_CARE_PROVIDER_SITE_OTHER): Payer: Medicaid Other | Admitting: Advanced Practice Midwife

## 2019-04-30 ENCOUNTER — Ambulatory Visit (INDEPENDENT_AMBULATORY_CARE_PROVIDER_SITE_OTHER): Payer: Medicaid Other

## 2019-04-30 ENCOUNTER — Other Ambulatory Visit: Payer: Self-pay

## 2019-04-30 ENCOUNTER — Encounter: Payer: Self-pay | Admitting: Advanced Practice Midwife

## 2019-04-30 VITALS — BP 142/88 | HR 92 | Wt 387.8 lb

## 2019-04-30 DIAGNOSIS — Z1389 Encounter for screening for other disorder: Secondary | ICD-10-CM

## 2019-04-30 DIAGNOSIS — O10912 Unspecified pre-existing hypertension complicating pregnancy, second trimester: Secondary | ICD-10-CM | POA: Diagnosis not present

## 2019-04-30 DIAGNOSIS — Z363 Encounter for antenatal screening for malformations: Secondary | ICD-10-CM | POA: Diagnosis not present

## 2019-04-30 DIAGNOSIS — Z331 Pregnant state, incidental: Secondary | ICD-10-CM

## 2019-04-30 DIAGNOSIS — Z3A19 19 weeks gestation of pregnancy: Secondary | ICD-10-CM | POA: Diagnosis not present

## 2019-04-30 DIAGNOSIS — O0992 Supervision of high risk pregnancy, unspecified, second trimester: Secondary | ICD-10-CM | POA: Diagnosis not present

## 2019-04-30 DIAGNOSIS — O10919 Unspecified pre-existing hypertension complicating pregnancy, unspecified trimester: Secondary | ICD-10-CM

## 2019-04-30 DIAGNOSIS — O0990 Supervision of high risk pregnancy, unspecified, unspecified trimester: Secondary | ICD-10-CM

## 2019-04-30 LAB — POCT URINALYSIS DIPSTICK OB
Blood, UA: NEGATIVE
Glucose, UA: NEGATIVE
Ketones, UA: NEGATIVE
Leukocytes, UA: NEGATIVE
Nitrite, UA: NEGATIVE
POC,PROTEIN,UA: NEGATIVE

## 2019-04-30 MED ORDER — BLOOD PRESSURE MONITOR AUTOMAT DEVI: 0 refills | Status: AC

## 2019-04-30 NOTE — Progress Notes (Signed)
[place] 19+5 wks,breech,anterior placenta gr 0,normal ovaries bilat,fhr 157 bpm,cx 3.1 cm,svp of fluid 6 cm,efw 300 g,limited ultrasound because of fetal position and pt body habitus,please repeat anatomy scan

## 2019-04-30 NOTE — Progress Notes (Signed)
HIGH-RISK PREGNANCY VISIT Patient name: Hannah Ryan MRN [id number]  Date of birth: 09-24-1991 Chief Complaint:   High Risk Gestation (u/s)  History of Present Illness:   Hannah Ryan is a 28 y.o. G71P1001 female at [redacted]w[redacted]d with an Estimated Date of Delivery: 09/19/19 being seen today for ongoing management of a high-risk pregnancy complicated by chronic HTN. Started on labetalol 200mg  BID. Also polysubstance abuse (narcotics, cocaine, benzo, THC)  Expressed desire to stop during pregnany. Given list of subutex providers 2 weeks ago. Says the one she called didn't take medicaid and was going to charge $88.  Works at [employer] and gamble, wants note to not have to walk the 0.27miles from parking lot to bldg and to have a sit dpwn position Note given. Today she reports not having any street narcotics for a couple of days.  Discussed that I would do my best to refer her to our [place] office where a subutex provider works.  Contractions: Not present.  .  Movement: Present. denies leaking of fluid.  Review of Systems:   Pertinent items are noted in HPI Denies abnormal vaginal discharge w/ itching/odor/irritation, headaches, visual changes, shortness of breath, chest pain, abdominal pain, severe nausea/vomiting, or problems with urination or bowel movements unless otherwise stated above.    Pertinent History Reviewed:  Medical & Surgical Hx:   Past Medical History:  Diagnosis Date  . Anxiety    per patient/family  . Chlamydia   . Depression    per patient/family  . Gonorrhea   . Obesity   . Trichomonas infection    Past Surgical History:  Procedure Laterality Date  . ADENOIDECTOMY    . CESAREAN SECTION    . FOOT SURGERY     left  . IRRIGATION AND DEBRIDEMENT ABSCESS  01/08/2012   Procedure: IRRIGATION AND DEBRIDEMENT ABSCESS;  Surgeon: Donato Heinz, MD;  Location: [facility];  Service: General;  Laterality: Right;  . TONSILLECTOMY     Family History  Problem Relation Age of Onset  . Diabetes  Maternal Grandmother   . Hypertension Maternal Grandmother   . Hypertension Father   . Hypertension Mother   . Diabetes Mother   . Autism Brother   . Lupus Paternal Aunt   . Lupus Cousin     Current Outpatient Medications:  .  aspirin 81 MG tablet, Take 2 tablets (162 mg total) by mouth daily., Disp: 60 tablet, Rfl: 6 .  labetalol (NORMODYNE) 200 MG tablet, Take 1 tablet (200 mg total) by mouth 2 (two) times daily., Disp: 60 tablet, Rfl: 3 .  Prenatal Vit-Fe Fumarate-FA (PREPLUS) 27-1 MG TABS, Take 1 tablet by mouth daily., Disp: 30 tablet, Rfl: 12 .  Blood Pressure Monitoring (BLOOD PRESSURE MONITOR AUTOMAT) DEVI, Take BP at home daily.  Alert [place] if >140/90 more than once., Disp: 1 Device, Rfl: 0 Social History: Reviewed -  reports that she has been smoking cigarettes. She has a 0.75 pack-year smoking history. She has never used smokeless tobacco.   Physical Assessment:   Vitals:   04/30/19 1538  BP: (!) 142/88  Pulse: 92  Weight: (!) 387 lb 12.8 oz (175.9 kg)  Body mass index is 58.96 kg/m.           Physical Examination:   General appearance: alert, well appearing, and in no distress  Mental status: alert, oriented to person, place, and time  Skin: warm & dry   Extremities: Edema: Trace    Cardiovascular: normal heart rate noted  Respiratory: normal  respiratory effort, no distress  Abdomen: gravid, soft, non-tender  Pelvic: Cervical exam deferred         Fetal Status: Fetal Heart Rate (bpm): us   Movement: Present    Fetal Surveillance Testing today: US 19+5 wks,breech,anterior placenta gr 0,normal ovaries bilat,fhr 157 bpm,cx 3.1 cm,svp of fluid 6 cm,efw 300 g,limited ultrasound because of fetal position and pt body habitus,please repeat anatomy scan  Results for orders placed or performed in visit on 04/30/19 (from the past 24 hour(s))  POC Urinalysis Dipstick OB   Collection Time: 04/30/19  3:45 PM  Result Value Ref Range   Color, UA     Clarity, UA     Glucose,  UA Negative Negative   Bilirubin, UA     Ketones, UA neg    Spec Grav, UA     Blood, UA neg    pH, UA     POC,PROTEIN,UA Negative Negative, Trace, Small (1+), Moderate (2+), Large (3+), 4+   Urobilinogen, UA     Nitrite, UA neg    Leukocytes, UA Negative Negative   Appearance     Odor      Assessment & Plan:  1) High-risk pregnancy G2P1001 at 4133w5d with an Estimated Date of Delivery: 09/19/19   2) CHTN, stable.  Treatment Plan:  Growth u/s @  24, 28, 32, 36wks    2x/wk testing nst/sono @ 32wks or weekly BPP    Deliver 39wks (37wks if poor control)____   3) polysubstance abuse, unstable.  Treatment Plan:  Epic message sent to Dr Shawnie PonsPratt for subutex treatment ([place]  Labs/procedures today:    Follow-up: Return in about 2 weeks (around 05/14/2019) for HROB, ZO:XWRUEAVS:Anatomy. repeat scan  Future Appointments  Date Time Provider Department Center  05/26/2019  3:00 PM [facility] None  05/26/2019  3:45 PM Hermina StaggersErvin, Michael L, MD [facility]    Orders Placed This Encounter  Procedures  . US OB Comp + 14 Wk  . POC Urinalysis Dipstick OB   Jacklyn ShellFrances Cresenzo-Dishmon CNM 04/30/2019 4:32 PM

## 2019-05-02 ENCOUNTER — Encounter: Payer: Self-pay | Admitting: Family Medicine

## 2019-05-12 ENCOUNTER — Telehealth: Payer: Self-pay | Admitting: *Deleted

## 2019-05-12 ENCOUNTER — Telehealth: Payer: Self-pay | Admitting: Advanced Practice Midwife

## 2019-05-12 NOTE — Telephone Encounter (Signed)
Needs to talk about the note she was given to go back to work

## 2019-05-12 NOTE — Telephone Encounter (Signed)
LMOVM returning patient's call.  

## 2019-05-13 ENCOUNTER — Telehealth: Payer: Self-pay | Admitting: Family Medicine

## 2019-05-13 NOTE — Telephone Encounter (Signed)
Attempted to call patient about an appointment. Call was dropped, and no one would answer.

## 2019-05-15 ENCOUNTER — Telehealth: Payer: Self-pay | Admitting: Obstetrics & Gynecology

## 2019-05-15 NOTE — Telephone Encounter (Signed)
Attempted to call patient about her appointment for 07/23. I was not able to leave a message. She did not have a voice mailbox set-up.

## 2019-05-16 ENCOUNTER — Telehealth (INDEPENDENT_AMBULATORY_CARE_PROVIDER_SITE_OTHER): Payer: Medicaid Other | Admitting: Family Medicine

## 2019-05-16 ENCOUNTER — Other Ambulatory Visit: Payer: Self-pay

## 2019-05-16 DIAGNOSIS — O0992 Supervision of high risk pregnancy, unspecified, second trimester: Secondary | ICD-10-CM

## 2019-05-16 DIAGNOSIS — O10919 Unspecified pre-existing hypertension complicating pregnancy, unspecified trimester: Secondary | ICD-10-CM

## 2019-05-16 DIAGNOSIS — Z98891 History of uterine scar from previous surgery: Secondary | ICD-10-CM

## 2019-05-16 DIAGNOSIS — F1910 Other psychoactive substance abuse, uncomplicated: Secondary | ICD-10-CM

## 2019-05-16 DIAGNOSIS — O10912 Unspecified pre-existing hypertension complicating pregnancy, second trimester: Secondary | ICD-10-CM

## 2019-05-16 DIAGNOSIS — O0990 Supervision of high risk pregnancy, unspecified, unspecified trimester: Secondary | ICD-10-CM

## 2019-05-16 DIAGNOSIS — F1120 Opioid dependence, uncomplicated: Secondary | ICD-10-CM

## 2019-05-16 DIAGNOSIS — Z3A22 22 weeks gestation of pregnancy: Secondary | ICD-10-CM

## 2019-05-16 DIAGNOSIS — O99322 Drug use complicating pregnancy, second trimester: Secondary | ICD-10-CM

## 2019-05-16 MED ORDER — BUPRENORPHINE HCL-NALOXONE HCL 4-1 MG SL FILM: 1.0000 | ORAL_FILM | Freq: Every day | SUBLINGUAL | 0 refills | Status: DC
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
## 2019-05-26 ENCOUNTER — Other Ambulatory Visit: Payer: Self-pay

## 2019-05-26 ENCOUNTER — Telehealth: Payer: Self-pay | Admitting: Advanced Practice Midwife

## 2019-05-26 ENCOUNTER — Telehealth: Payer: Self-pay | Admitting: Family Medicine

## 2019-05-26 ENCOUNTER — Other Ambulatory Visit: Payer: Self-pay | Admitting: Obstetrics & Gynecology

## 2019-05-26 ENCOUNTER — Other Ambulatory Visit: Payer: Medicaid Other

## 2019-05-26 ENCOUNTER — Ambulatory Visit (INDEPENDENT_AMBULATORY_CARE_PROVIDER_SITE_OTHER): Payer: Medicaid Other | Admitting: Family Medicine

## 2019-05-26 ENCOUNTER — Encounter: Payer: Medicaid Other | Admitting: Obstetrics and Gynecology

## 2019-05-26 VITALS — BP 171/102 | HR 94 | Wt >= 6400 oz

## 2019-05-26 DIAGNOSIS — F329 Major depressive disorder, single episode, unspecified: Secondary | ICD-10-CM

## 2019-05-26 DIAGNOSIS — Z3A23 23 weeks gestation of pregnancy: Secondary | ICD-10-CM

## 2019-05-26 DIAGNOSIS — O0990 Supervision of high risk pregnancy, unspecified, unspecified trimester: Secondary | ICD-10-CM

## 2019-05-26 DIAGNOSIS — O10919 Unspecified pre-existing hypertension complicating pregnancy, unspecified trimester: Secondary | ICD-10-CM

## 2019-05-26 DIAGNOSIS — O10912 Unspecified pre-existing hypertension complicating pregnancy, second trimester: Secondary | ICD-10-CM

## 2019-05-26 DIAGNOSIS — F1910 Other psychoactive substance abuse, uncomplicated: Secondary | ICD-10-CM

## 2019-05-26 DIAGNOSIS — F1120 Opioid dependence, uncomplicated: Secondary | ICD-10-CM

## 2019-05-26 DIAGNOSIS — R4589 Other symptoms and signs involving emotional state: Secondary | ICD-10-CM

## 2019-05-26 DIAGNOSIS — Z98891 History of uterine scar from previous surgery: Secondary | ICD-10-CM

## 2019-05-26 DIAGNOSIS — O0992 Supervision of high risk pregnancy, unspecified, second trimester: Secondary | ICD-10-CM

## 2019-05-26 MED ORDER — BUPRENORPHINE HCL-NALOXONE HCL 4-1 MG SL FILM: 1.0000 | ORAL_FILM | Freq: Every day | SUBLINGUAL | 0 refills | Status: DC

## 2019-05-26 MED ORDER — LABETALOL HCL 200 MG PO TABS: 400.0000 mg | ORAL_TABLET | Freq: Two times a day (BID) | ORAL | 3 refills | Status: DC

## 2019-05-26 NOTE — Telephone Encounter (Signed)
Suboxone strips. A two week supply. The pharmacy we sent it to does not have it in stock and unsure of when they will have it. Patient requesting the prescription to be sent [facility] in [place] on [address].

## 2019-05-26 NOTE — Progress Notes (Signed)
Suboxone 14 doses reordered

## 2019-05-26 NOTE — Telephone Encounter (Signed)
Calling to get her Rx for Buprenorphine HCl-Naloxone HCl  Sent to [facility] in [place] on [address]

## 2019-05-26 NOTE — Addendum Note (Signed)
Addended by: Bethanne Ginger on: 05/26/2019 03:21 PM   Modules accepted: Orders

## 2019-05-26 NOTE — Patient Instructions (Signed)

## 2019-05-26 NOTE — Progress Notes (Signed)
Screening numbers high. Would like to talk with Roselyn Reef
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
## 2019-05-27 LAB — PROTEIN / CREATININE RATIO, URINE
Creatinine, Urine: 31 mg/dL
Protein, Ur: 5.9 mg/dL
Protein/Creat Ratio: 190 mg/g creat (ref 0–200)

## 2019-05-27 LAB — CBC
Hematocrit: 33.9 % — ABNORMAL LOW (ref 34.0–46.6)
Hemoglobin: 11.1 g/dL (ref 11.1–15.9)
MCH: 27.1 pg (ref 26.6–33.0)
MCHC: 32.7 g/dL (ref 31.5–35.7)
MCV: 83 fL (ref 79–97)
Platelets: 210 10*3/uL (ref 150–450)
RBC: 4.09 x10E6/uL (ref 3.77–5.28)
RDW: 14.9 % (ref 11.7–15.4)
WBC: 8 10*3/uL (ref 3.4–10.8)

## 2019-05-27 LAB — COMPREHENSIVE METABOLIC PANEL
ALT: 3 IU/L (ref 0–32)
AST: 11 IU/L (ref 0–40)
Albumin/Globulin Ratio: 1.4 (ref 1.2–2.2)
Albumin: 3.4 g/dL — ABNORMAL LOW (ref 3.9–5.0)
Alkaline Phosphatase: 58 IU/L (ref 39–117)
BUN/Creatinine Ratio: 14 (ref 9–23)
BUN: 7 mg/dL (ref 6–20)
Bilirubin Total: <0.2 mg/dL (ref 0.0–1.2)
CO2: 21 mmol/L (ref 20–29)
Calcium: 8.7 mg/dL (ref 8.7–10.2)
Chloride: 100 mmol/L (ref 96–106)
Creatinine, Ser: 0.49 mg/dL — ABNORMAL LOW (ref 0.57–1.00)
GFR calc Af Amer: 153 mL/min/{1.73_m2} (ref >59)
GFR calc non Af Amer: 133 mL/min/{1.73_m2} (ref >59)
Globulin, Total: 2.4 g/dL (ref 1.5–4.5)
Glucose: 77 mg/dL (ref 65–99)
Potassium: 4.4 mmol/L (ref 3.5–5.2)
Sodium: 134 mmol/L (ref 134–144)
Total Protein: 5.8 g/dL — ABNORMAL LOW (ref 6.0–8.5)

## 2019-05-30 LAB — TOXASSURE SELECT 13 (MW), URINE

## 2019-06-02 NOTE — Telephone Encounter (Signed)
Called and spoke with Pharmacy and they reports pt picked up her prescription on 05/29/2019. No further needs identified at this time.

## 2019-06-05 ENCOUNTER — Telehealth: Payer: Self-pay

## 2019-06-05 NOTE — Telephone Encounter (Signed)
Message received from the front office that pt requested a call in regards to her medication.  LM for pt that if she continues to have questions comments or concern to please give the office a call back.

## 2019-06-09 ENCOUNTER — Encounter: Payer: Self-pay | Admitting: Family Medicine

## 2019-06-09 ENCOUNTER — Ambulatory Visit (INDEPENDENT_AMBULATORY_CARE_PROVIDER_SITE_OTHER): Payer: Medicaid Other | Admitting: Family Medicine

## 2019-06-09 ENCOUNTER — Other Ambulatory Visit: Payer: Self-pay

## 2019-06-09 VITALS — BP 133/82 | HR 96 | Wt 393.0 lb

## 2019-06-09 DIAGNOSIS — O0990 Supervision of high risk pregnancy, unspecified, unspecified trimester: Secondary | ICD-10-CM

## 2019-06-09 DIAGNOSIS — O0992 Supervision of high risk pregnancy, unspecified, second trimester: Secondary | ICD-10-CM

## 2019-06-09 DIAGNOSIS — O10919 Unspecified pre-existing hypertension complicating pregnancy, unspecified trimester: Secondary | ICD-10-CM

## 2019-06-09 DIAGNOSIS — O99212 Obesity complicating pregnancy, second trimester: Secondary | ICD-10-CM

## 2019-06-09 DIAGNOSIS — O10912 Unspecified pre-existing hypertension complicating pregnancy, second trimester: Secondary | ICD-10-CM

## 2019-06-09 DIAGNOSIS — Z98891 History of uterine scar from previous surgery: Secondary | ICD-10-CM

## 2019-06-09 DIAGNOSIS — F1120 Opioid dependence, uncomplicated: Secondary | ICD-10-CM

## 2019-06-09 DIAGNOSIS — F1910 Other psychoactive substance abuse, uncomplicated: Secondary | ICD-10-CM

## 2019-06-09 DIAGNOSIS — O99322 Drug use complicating pregnancy, second trimester: Secondary | ICD-10-CM

## 2019-06-09 DIAGNOSIS — Z3A25 25 weeks gestation of pregnancy: Secondary | ICD-10-CM

## 2019-06-09 MED ORDER — BUPRENORPHINE HCL-NALOXONE HCL 4-1 MG SL FILM: 1.0000 | ORAL_FILM | Freq: Every day | SUBLINGUAL | 0 refills | Status: DC
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
## 2019-06-09 NOTE — Patient Instructions (Signed)

## 2019-06-13 LAB — TOXASSURE SELECT 13 (MW), URINE

## 2019-06-17 ENCOUNTER — Telehealth: Payer: Self-pay | Admitting: Pediatrics

## 2019-06-17 NOTE — Telephone Encounter (Signed)
Encounter opened in error,

## 2019-06-24 ENCOUNTER — Ambulatory Visit (HOSPITAL_COMMUNITY): Payer: Medicaid Other

## 2019-06-24 ENCOUNTER — Other Ambulatory Visit: Payer: Self-pay | Admitting: *Deleted

## 2019-06-24 ENCOUNTER — Ambulatory Visit (HOSPITAL_COMMUNITY)
Admission: RE | Admit: 2019-06-24 | Payer: Medicaid Other | Source: Ambulatory Visit | Attending: Family Medicine | Admitting: Family Medicine

## 2019-06-24 DIAGNOSIS — O0990 Supervision of high risk pregnancy, unspecified, unspecified trimester: Secondary | ICD-10-CM

## 2019-06-26 ENCOUNTER — Telehealth: Payer: Self-pay | Admitting: Family Medicine

## 2019-06-26 NOTE — Telephone Encounter (Signed)
Attempted to call patient about her appointment on 9/4 @ 8:50. A lady answered the phone and stated that the patient is currently sleeping and if she could take a message. Person informed I was calling from the patient's doctor's office and if she could called back. Lady verbalized understanding.

## 2019-06-27 ENCOUNTER — Other Ambulatory Visit: Payer: Medicaid Other

## 2019-06-27 ENCOUNTER — Other Ambulatory Visit: Payer: Self-pay

## 2019-06-27 ENCOUNTER — Ambulatory Visit (INDEPENDENT_AMBULATORY_CARE_PROVIDER_SITE_OTHER): Payer: Medicaid Other | Admitting: Family Medicine

## 2019-06-27 VITALS — BP 127/85 | HR 86 | Temp 97.6°F | Wt 395.8 lb

## 2019-06-27 DIAGNOSIS — Z23 Encounter for immunization: Secondary | ICD-10-CM

## 2019-06-27 DIAGNOSIS — F1910 Other psychoactive substance abuse, uncomplicated: Secondary | ICD-10-CM

## 2019-06-27 DIAGNOSIS — O0993 Supervision of high risk pregnancy, unspecified, third trimester: Secondary | ICD-10-CM | POA: Diagnosis present

## 2019-06-27 DIAGNOSIS — O0990 Supervision of high risk pregnancy, unspecified, unspecified trimester: Secondary | ICD-10-CM | POA: Diagnosis not present

## 2019-06-27 DIAGNOSIS — O34211 Maternal care for low transverse scar from previous cesarean delivery: Secondary | ICD-10-CM

## 2019-06-27 DIAGNOSIS — F1120 Opioid dependence, uncomplicated: Secondary | ICD-10-CM | POA: Diagnosis not present

## 2019-06-27 DIAGNOSIS — O10919 Unspecified pre-existing hypertension complicating pregnancy, unspecified trimester: Secondary | ICD-10-CM

## 2019-06-27 DIAGNOSIS — F329 Major depressive disorder, single episode, unspecified: Secondary | ICD-10-CM | POA: Diagnosis not present

## 2019-06-27 DIAGNOSIS — O10913 Unspecified pre-existing hypertension complicating pregnancy, third trimester: Secondary | ICD-10-CM | POA: Diagnosis not present

## 2019-06-27 DIAGNOSIS — Z98891 History of uterine scar from previous surgery: Secondary | ICD-10-CM

## 2019-06-27 DIAGNOSIS — R4589 Other symptoms and signs involving emotional state: Secondary | ICD-10-CM

## 2019-06-27 MED ORDER — BUPRENORPHINE HCL-NALOXONE HCL 4-1 MG SL FILM: 1.0000 | ORAL_FILM | Freq: Two times a day (BID) | SUBLINGUAL | 0 refills | Status: DC

## 2019-06-27 MED ORDER — TETANUS-DIPHTH-ACELL PERTUSSIS 5-2.5-18.5 LF-MCG/0.5 IM SUSP
0.5000 mL | Freq: Once | INTRAMUSCULAR | Status: AC
  Administered 2019-06-27: 0.5 mL via INTRAMUSCULAR
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
## 2019-06-27 NOTE — Progress Notes (Signed)
Pt has 28 wk labs today. She states last dose of Suboxone was yesterday - needs refill.

## 2019-06-28 LAB — CBC
Hematocrit: 31.6 % — ABNORMAL LOW (ref 34.0–46.6)
Hemoglobin: 10.8 g/dL — ABNORMAL LOW (ref 11.1–15.9)
MCH: 27.8 pg (ref 26.6–33.0)
MCHC: 34.2 g/dL (ref 31.5–35.7)
MCV: 81 fL (ref 79–97)
Platelets: 199 10*3/uL (ref 150–450)
RBC: 3.89 x10E6/uL (ref 3.77–5.28)
RDW: 13.6 % (ref 11.7–15.4)
WBC: 8.4 10*3/uL (ref 3.4–10.8)

## 2019-06-28 LAB — RPR: RPR Ser Ql: NONREACTIVE

## 2019-06-28 LAB — HIV ANTIBODY (ROUTINE TESTING W REFLEX): HIV Screen 4th Generation wRfx: NONREACTIVE

## 2019-06-28 LAB — GLUCOSE TOLERANCE, 2 HOURS W/ 1HR
Glucose, 1 hour: 116 mg/dL (ref 65–179)
Glucose, 2 hour: 79 mg/dL (ref 65–152)
Glucose, Fasting: 81 mg/dL (ref 65–91)

## 2019-07-01 LAB — TOXASSURE SELECT 13 (MW), URINE

## 2019-07-04 ENCOUNTER — Ambulatory Visit (HOSPITAL_COMMUNITY): Payer: Medicaid Other | Admitting: *Deleted

## 2019-07-04 ENCOUNTER — Encounter (HOSPITAL_COMMUNITY): Payer: Self-pay

## 2019-07-04 ENCOUNTER — Other Ambulatory Visit: Payer: Self-pay

## 2019-07-04 ENCOUNTER — Ambulatory Visit (HOSPITAL_COMMUNITY)
Admission: RE | Admit: 2019-07-04 | Discharge: 2019-07-04 | Disposition: A | Discharge status: Home / Self Care | Payer: Medicaid Other | Source: Ambulatory Visit | Attending: Obstetrics and Gynecology | Admitting: Obstetrics and Gynecology

## 2019-07-04 ENCOUNTER — Other Ambulatory Visit (HOSPITAL_COMMUNITY): Payer: Self-pay | Admitting: *Deleted

## 2019-07-04 VITALS — BP 130/75 | HR 93 | Temp 97.8°F

## 2019-07-04 DIAGNOSIS — O0990 Supervision of high risk pregnancy, unspecified, unspecified trimester: Secondary | ICD-10-CM | POA: Insufficient documentation

## 2019-07-04 DIAGNOSIS — O99323 Drug use complicating pregnancy, third trimester: Secondary | ICD-10-CM

## 2019-07-04 DIAGNOSIS — Z362 Encounter for other antenatal screening follow-up: Secondary | ICD-10-CM

## 2019-07-04 DIAGNOSIS — O10013 Pre-existing essential hypertension complicating pregnancy, third trimester: Secondary | ICD-10-CM

## 2019-07-04 DIAGNOSIS — F1990 Other psychoactive substance use, unspecified, uncomplicated: Secondary | ICD-10-CM

## 2019-07-04 DIAGNOSIS — O99213 Obesity complicating pregnancy, third trimester: Secondary | ICD-10-CM

## 2019-07-04 DIAGNOSIS — O99320 Drug use complicating pregnancy, unspecified trimester: Secondary | ICD-10-CM | POA: Diagnosis not present

## 2019-07-04 DIAGNOSIS — O09893 Supervision of other high risk pregnancies, third trimester: Secondary | ICD-10-CM

## 2019-07-04 DIAGNOSIS — O09293 Supervision of pregnancy with other poor reproductive or obstetric history, third trimester: Secondary | ICD-10-CM

## 2019-07-04 DIAGNOSIS — O34219 Maternal care for unspecified type scar from previous cesarean delivery: Secondary | ICD-10-CM

## 2019-07-04 DIAGNOSIS — F1120 Opioid dependence, uncomplicated: Secondary | ICD-10-CM | POA: Diagnosis not present

## 2019-07-04 DIAGNOSIS — Z3A29 29 weeks gestation of pregnancy: Secondary | ICD-10-CM

## 2019-07-14 ENCOUNTER — Other Ambulatory Visit: Payer: Self-pay

## 2019-07-14 ENCOUNTER — Ambulatory Visit (INDEPENDENT_AMBULATORY_CARE_PROVIDER_SITE_OTHER): Payer: Medicaid Other | Admitting: Family Medicine

## 2019-07-14 VITALS — BP 136/93 | HR 89 | Temp 97.9°F | Wt >= 6400 oz

## 2019-07-14 DIAGNOSIS — F1120 Opioid dependence, uncomplicated: Secondary | ICD-10-CM

## 2019-07-14 DIAGNOSIS — O10919 Unspecified pre-existing hypertension complicating pregnancy, unspecified trimester: Secondary | ICD-10-CM

## 2019-07-14 DIAGNOSIS — O0990 Supervision of high risk pregnancy, unspecified, unspecified trimester: Secondary | ICD-10-CM

## 2019-07-14 DIAGNOSIS — O99343 Other mental disorders complicating pregnancy, third trimester: Secondary | ICD-10-CM

## 2019-07-14 DIAGNOSIS — O0993 Supervision of high risk pregnancy, unspecified, third trimester: Secondary | ICD-10-CM

## 2019-07-14 DIAGNOSIS — O99213 Obesity complicating pregnancy, third trimester: Secondary | ICD-10-CM

## 2019-07-14 DIAGNOSIS — R4589 Other symptoms and signs involving emotional state: Secondary | ICD-10-CM

## 2019-07-14 DIAGNOSIS — Z98891 History of uterine scar from previous surgery: Secondary | ICD-10-CM

## 2019-07-14 DIAGNOSIS — F329 Major depressive disorder, single episode, unspecified: Secondary | ICD-10-CM

## 2019-07-14 DIAGNOSIS — O99323 Drug use complicating pregnancy, third trimester: Secondary | ICD-10-CM

## 2019-07-14 DIAGNOSIS — Z3A30 30 weeks gestation of pregnancy: Secondary | ICD-10-CM

## 2019-07-14 DIAGNOSIS — O10913 Unspecified pre-existing hypertension complicating pregnancy, third trimester: Secondary | ICD-10-CM

## 2019-07-14 MED ORDER — BUPRENORPHINE HCL-NALOXONE HCL 4-1 MG SL FILM: 1.0000 | ORAL_FILM | Freq: Two times a day (BID) | SUBLINGUAL | 0 refills | Status: DC
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
## 2019-07-14 NOTE — Progress Notes (Signed)
amu

## 2019-07-15 ENCOUNTER — Telehealth: Payer: Self-pay | Admitting: *Deleted

## 2019-07-15 NOTE — Telephone Encounter (Signed)
Hannah Ryan called 3 times yesterday afternoon and left messages that we sent her prescription to [facility] and they won't be open until tomorrow ( today) and wants to get it sent elsewhere and asks for a call back.

## 2019-07-15 NOTE — Telephone Encounter (Signed)
I called [facility] pharmacy and verified they did get the prescription for Suboxone but they did not have it in stock yesterday but will get it today and rx will be ready by 2pm.  I called [facility] and was unable to leave a message. Will send Mychart message that rx will be ready at 2pm .  Linda,RN

## 2019-07-18 ENCOUNTER — Encounter (HOSPITAL_COMMUNITY): Payer: Self-pay

## 2019-07-18 ENCOUNTER — Other Ambulatory Visit: Payer: Self-pay

## 2019-07-18 ENCOUNTER — Ambulatory Visit (HOSPITAL_COMMUNITY)
Admission: RE | Admit: 2019-07-18 | Discharge: 2019-07-18 | Disposition: A | Discharge status: Home / Self Care | Payer: Medicaid Other | Source: Ambulatory Visit | Attending: Obstetrics | Admitting: Obstetrics

## 2019-07-18 ENCOUNTER — Ambulatory Visit (HOSPITAL_COMMUNITY): Payer: Medicaid Other | Admitting: *Deleted

## 2019-07-18 VITALS — BP 148/88 | HR 102 | Temp 97.9°F

## 2019-07-18 DIAGNOSIS — O99323 Drug use complicating pregnancy, third trimester: Secondary | ICD-10-CM

## 2019-07-18 DIAGNOSIS — O99213 Obesity complicating pregnancy, third trimester: Secondary | ICD-10-CM

## 2019-07-18 DIAGNOSIS — Z362 Encounter for other antenatal screening follow-up: Secondary | ICD-10-CM | POA: Insufficient documentation

## 2019-07-18 DIAGNOSIS — O10013 Pre-existing essential hypertension complicating pregnancy, third trimester: Secondary | ICD-10-CM

## 2019-07-18 DIAGNOSIS — Z3A31 31 weeks gestation of pregnancy: Secondary | ICD-10-CM

## 2019-07-18 DIAGNOSIS — F1990 Other psychoactive substance use, unspecified, uncomplicated: Secondary | ICD-10-CM

## 2019-07-18 DIAGNOSIS — O10919 Unspecified pre-existing hypertension complicating pregnancy, unspecified trimester: Secondary | ICD-10-CM | POA: Diagnosis not present

## 2019-07-18 DIAGNOSIS — O09893 Supervision of other high risk pregnancies, third trimester: Secondary | ICD-10-CM | POA: Diagnosis not present

## 2019-07-18 DIAGNOSIS — O09293 Supervision of pregnancy with other poor reproductive or obstetric history, third trimester: Secondary | ICD-10-CM

## 2019-07-18 DIAGNOSIS — O34219 Maternal care for unspecified type scar from previous cesarean delivery: Secondary | ICD-10-CM

## 2019-07-22 ENCOUNTER — Other Ambulatory Visit: Payer: Self-pay

## 2019-07-22 ENCOUNTER — Ambulatory Visit (INDEPENDENT_AMBULATORY_CARE_PROVIDER_SITE_OTHER): Payer: Medicaid Other | Admitting: Clinical

## 2019-07-22 DIAGNOSIS — Z658 Other specified problems related to psychosocial circumstances: Secondary | ICD-10-CM

## 2019-07-22 DIAGNOSIS — F1110 Opioid abuse, uncomplicated: Secondary | ICD-10-CM | POA: Diagnosis not present

## 2019-07-22 NOTE — Progress Notes (Signed)
Suboxone last refilled on 9/21. Due on 10/5. Will refill on that date

## 2019-07-22 NOTE — BH Specialist Note (Signed)
Integrated Behavioral Health via Telemedicine Telephone Visit  07/22/2019 Hannah Ryan [id number]  Number of [place] visits: 1 Session Start time: 3:51  Session End time: 4:22 Total time: 30 minutes  Referring Provider: Loma Boston, DO Type of Visit: Telephone Patient/Family location: [facility] Provider location: [facility] All persons participating in visit: Patient Hannah Ryan and [place]  Confirmed patient's address: Yes  Confirmed patient's phone number: Yes  Any changes to demographics: No   Confirmed patient's insurance: Yes  Any changes to patient's insurance: No   Discussed confidentiality: Yes   I connected with Hannah Ryan by a video enabled telemedicine application and verified that I am speaking with the correct person using two identifiers.     I discussed the limitations of evaluation and management by telemedicine and the availability of in person appointments.  I discussed that the purpose of this visit is to provide behavioral health care while limiting exposure to the novel coronavirus.   Discussed there is a possibility of technology failure and discussed alternative modes of communication if that failure occurs.  I discussed that engaging in this telephone visit, they consent to the provision of behavioral healthcare and the services will be billed under their insurance.  Patient and/or legal guardian expressed understanding and consented to video visit: Yes   PRESENTING CONCERNS: Patient and/or family reports the following symptoms/concerns: Pt states her primary concern today is life stress, and her main goal is to get into housing for herself and her children, and to get a doctor's note for [employer], her last step before being accepted into [employer]. Pt says her feet start "swelling up real bad" with a lot of pelvis pressure, after a couple of hours on her feet when she was working, and that in order  to get into the building, she had to walk "about 1/2 mile to and from my station". Pt says she is feeling more anxious and depressed in pregnancy, but not concerned as much with that right now. Pt also worried that her Suboxone will run out before her 08-01-19 visit, and she wants to keep baby safe. Pt denies current substance use.  Duration of problem: Current pregnancy; Severity of problem: moderate  STRENGTHS (Protective Factors/Coping Skills): Resilience; Supportive mother  GOALS ADDRESSED: Patient will: 1.  Reduce symptoms of: anxiety, depression and stress  2.  Increase knowledge and/or ability of: healthy habits and stress reduction  3.  Demonstrate ability to: Increase healthy adjustment to current life circumstances and Increase motivation to adhere to plan of care  INTERVENTIONS: Interventions utilized:  Solution-Focused Strategies and Psychoeducation and/or Health Education Standardized Assessments completed: Not Needed  ASSESSMENT: Patient currently experiencing Psychosocial stress.   Patient may benefit from psychoeducation and brief therapeutic interventions regarding coping with symptoms of life stress, anxiety, depression, and OUD .  PLAN: 1. Follow up with behavioral health clinician on : Two weeks 2. Behavioral recommendations:  -Continue taking all medications as prescribed, including prenatal vitamin and Suboxone -Continue working with [facility] case management, [employer], and [employer] for work and housing issues -Go to [email or web address] to watch Virtual Tour of new hospital, and know location; share information with support person  3. Referral(s): [place] (In Clinic)  I discussed the assessment and treatment plan with the patient and/or parent/guardian. They were provided an opportunity to ask questions and all were answered. They agreed with the plan and demonstrated an understanding of the instructions.   They were  advised to  call back or seek an in-person evaluation if the symptoms worsen or if the condition fails to improve as anticipated.  [facility]  Depression screen [facility] 2/9 07/14/2019 05/26/2019 03/11/2019 02/21/2019 11/20/2017  Decreased Interest 2 2 1  0 0  Down, Depressed, Hopeless 2 3 1 1 2   PHQ - 2 Score 4 5 2 1 2   Altered sleeping 2 2 1  - 2  Tired, decreased energy 0 1 1 - 0  Change in appetite 2 2 2  - 2  Feeling bad or failure about yourself  2 3 1  - 0  Trouble concentrating 0 0 0 - 0  Moving slowly or fidgety/restless 0 0 0 - 2  Suicidal thoughts 0 0 0 - 0  PHQ-9 Score 10 13 7  - 8  Difficult doing work/chores - - - - -   GAD 7 : Generalized Anxiety Score 07/14/2019 05/26/2019  Nervous, Anxious, on Edge 2 2  Control/stop worrying 2 1  Worry too much - different things 2 3  Trouble relaxing 2 1  Restless 0 1  Easily annoyed or irritable 2 2  Afraid - awful might happen 2 2  Total GAD 7 Score 12 12

## 2019-07-23 ENCOUNTER — Telehealth: Payer: Self-pay | Admitting: Obstetrics & Gynecology

## 2019-07-23 NOTE — Telephone Encounter (Signed)
Attempted to call patient with some appointment changes due to her only being able to see certain providers due to a medication that she is on. No answer, left voicemail with new appointment information ( 10/12 @ 3:35).

## 2019-07-25 ENCOUNTER — Encounter (HOSPITAL_COMMUNITY): Payer: Self-pay

## 2019-07-25 ENCOUNTER — Ambulatory Visit (HOSPITAL_COMMUNITY): Payer: Medicaid Other | Admitting: *Deleted

## 2019-07-25 ENCOUNTER — Ambulatory Visit (HOSPITAL_COMMUNITY)
Admission: RE | Admit: 2019-07-25 | Discharge: 2019-07-25 | Disposition: A | Discharge status: Home / Self Care | Payer: Medicaid Other | Source: Ambulatory Visit | Attending: Obstetrics | Admitting: Obstetrics

## 2019-07-25 ENCOUNTER — Other Ambulatory Visit: Payer: Self-pay

## 2019-07-25 VITALS — BP 136/75 | HR 83 | Temp 98.2°F

## 2019-07-25 DIAGNOSIS — O99323 Drug use complicating pregnancy, third trimester: Secondary | ICD-10-CM | POA: Diagnosis not present

## 2019-07-25 DIAGNOSIS — O10919 Unspecified pre-existing hypertension complicating pregnancy, unspecified trimester: Secondary | ICD-10-CM

## 2019-07-25 DIAGNOSIS — F1990 Other psychoactive substance use, unspecified, uncomplicated: Secondary | ICD-10-CM

## 2019-07-25 DIAGNOSIS — O09893 Supervision of other high risk pregnancies, third trimester: Secondary | ICD-10-CM

## 2019-07-25 DIAGNOSIS — O09293 Supervision of pregnancy with other poor reproductive or obstetric history, third trimester: Secondary | ICD-10-CM

## 2019-07-25 DIAGNOSIS — O10013 Pre-existing essential hypertension complicating pregnancy, third trimester: Secondary | ICD-10-CM | POA: Diagnosis not present

## 2019-07-25 DIAGNOSIS — Z3A32 32 weeks gestation of pregnancy: Secondary | ICD-10-CM

## 2019-07-25 DIAGNOSIS — O99213 Obesity complicating pregnancy, third trimester: Secondary | ICD-10-CM

## 2019-07-25 DIAGNOSIS — Z362 Encounter for other antenatal screening follow-up: Secondary | ICD-10-CM | POA: Diagnosis not present

## 2019-07-25 DIAGNOSIS — O34219 Maternal care for unspecified type scar from previous cesarean delivery: Secondary | ICD-10-CM

## 2019-07-28 ENCOUNTER — Other Ambulatory Visit: Payer: Self-pay | Admitting: Family Medicine

## 2019-07-28 DIAGNOSIS — F1120 Opioid dependence, uncomplicated: Secondary | ICD-10-CM

## 2019-07-28 MED ORDER — BUPRENORPHINE HCL-NALOXONE HCL 4-1 MG SL FILM: 1.0000 | ORAL_FILM | Freq: Two times a day (BID) | SUBLINGUAL | 0 refills | Status: DC

## 2019-07-31 NOTE — BH Specialist Note (Signed)
Pt did not arrive to video visit and did not answer the phone; "call cannot be completed at this time", so no voicemail left, but left MyChart message for patient.   [place] via Telemedicine Video Visit  07/31/2019 Sherrilynn Gudgel [id number]   [name]

## 2019-08-01 ENCOUNTER — Ambulatory Visit (HOSPITAL_COMMUNITY)
Admission: RE | Admit: 2019-08-01 | Discharge: 2019-08-01 | Disposition: A | Discharge status: Home / Self Care | Payer: Medicaid Other | Source: Ambulatory Visit | Attending: Obstetrics | Admitting: Obstetrics

## 2019-08-01 ENCOUNTER — Other Ambulatory Visit: Payer: Self-pay

## 2019-08-01 ENCOUNTER — Other Ambulatory Visit (HOSPITAL_COMMUNITY): Payer: Self-pay | Admitting: *Deleted

## 2019-08-01 ENCOUNTER — Encounter: Payer: Medicaid Other | Admitting: Obstetrics & Gynecology

## 2019-08-01 ENCOUNTER — Encounter (HOSPITAL_COMMUNITY): Payer: Self-pay

## 2019-08-01 ENCOUNTER — Ambulatory Visit: Payer: Medicaid Other | Admitting: Clinical

## 2019-08-01 ENCOUNTER — Ambulatory Visit (HOSPITAL_COMMUNITY): Payer: Medicaid Other | Admitting: *Deleted

## 2019-08-01 VITALS — BP 137/78 | HR 91 | Temp 98.2°F

## 2019-08-01 DIAGNOSIS — O99323 Drug use complicating pregnancy, third trimester: Secondary | ICD-10-CM

## 2019-08-01 DIAGNOSIS — O10919 Unspecified pre-existing hypertension complicating pregnancy, unspecified trimester: Secondary | ICD-10-CM | POA: Diagnosis not present

## 2019-08-01 DIAGNOSIS — F1990 Other psychoactive substance use, unspecified, uncomplicated: Secondary | ICD-10-CM | POA: Diagnosis not present

## 2019-08-01 DIAGNOSIS — Z3A33 33 weeks gestation of pregnancy: Secondary | ICD-10-CM

## 2019-08-01 DIAGNOSIS — O99213 Obesity complicating pregnancy, third trimester: Secondary | ICD-10-CM

## 2019-08-01 DIAGNOSIS — Z91199 Patient's noncompliance with other medical treatment and regimen due to unspecified reason: Secondary | ICD-10-CM

## 2019-08-01 DIAGNOSIS — O10013 Pre-existing essential hypertension complicating pregnancy, third trimester: Secondary | ICD-10-CM

## 2019-08-01 DIAGNOSIS — O09893 Supervision of other high risk pregnancies, third trimester: Secondary | ICD-10-CM

## 2019-08-01 DIAGNOSIS — Z5329 Procedure and treatment not carried out because of patient's decision for other reasons: Secondary | ICD-10-CM

## 2019-08-01 DIAGNOSIS — Z362 Encounter for other antenatal screening follow-up: Secondary | ICD-10-CM

## 2019-08-01 DIAGNOSIS — O34219 Maternal care for unspecified type scar from previous cesarean delivery: Secondary | ICD-10-CM

## 2019-08-01 DIAGNOSIS — O09293 Supervision of pregnancy with other poor reproductive or obstetric history, third trimester: Secondary | ICD-10-CM

## 2019-08-04 ENCOUNTER — Other Ambulatory Visit: Payer: Self-pay

## 2019-08-04 ENCOUNTER — Ambulatory Visit (INDEPENDENT_AMBULATORY_CARE_PROVIDER_SITE_OTHER): Payer: Medicaid Other | Admitting: Family Medicine

## 2019-08-04 VITALS — BP 139/90 | HR 88 | Temp 98.2°F | Wt >= 6400 oz

## 2019-08-04 DIAGNOSIS — F1120 Opioid dependence, uncomplicated: Secondary | ICD-10-CM

## 2019-08-04 DIAGNOSIS — O0990 Supervision of high risk pregnancy, unspecified, unspecified trimester: Secondary | ICD-10-CM

## 2019-08-04 DIAGNOSIS — Z3A33 33 weeks gestation of pregnancy: Secondary | ICD-10-CM

## 2019-08-04 DIAGNOSIS — O10913 Unspecified pre-existing hypertension complicating pregnancy, third trimester: Secondary | ICD-10-CM

## 2019-08-04 DIAGNOSIS — Z98891 History of uterine scar from previous surgery: Secondary | ICD-10-CM

## 2019-08-04 DIAGNOSIS — O99323 Drug use complicating pregnancy, third trimester: Secondary | ICD-10-CM

## 2019-08-04 DIAGNOSIS — O0993 Supervision of high risk pregnancy, unspecified, third trimester: Secondary | ICD-10-CM

## 2019-08-04 DIAGNOSIS — O10919 Unspecified pre-existing hypertension complicating pregnancy, unspecified trimester: Secondary | ICD-10-CM

## 2019-08-04 DIAGNOSIS — O99213 Obesity complicating pregnancy, third trimester: Secondary | ICD-10-CM

## 2019-08-04 MED ORDER — BUPRENORPHINE HCL-NALOXONE HCL 4-1 MG SL FILM: 1.0000 | ORAL_FILM | Freq: Three times a day (TID) | SUBLINGUAL | 0 refills | Status: DC
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
## 2019-08-05 ENCOUNTER — Telehealth: Payer: Self-pay | Admitting: *Deleted

## 2019-08-05 NOTE — Telephone Encounter (Signed)
-----   Message from Sloan Leiter, MD sent at 08/04/2019  6:41 PM EDT ----- Patient called RN line tonight asking for provider to call pharmacy to clarify suboxone dose, they want to speak with provider, I cannot prescribe suboxone so I cannot verify to pharmacy. Please follow up with patient. Thanks.

## 2019-08-05 NOTE — Telephone Encounter (Signed)
I called [place] and heard phone ring many times and then heard a message " Your call cannot be completed at this time, please call again". Per chart saw Dr. Nehemiah Settle yesterday and had ran out of suboxone but he sent in new prescription. I called [facility] and they confirm she picked it up last night about 7:30pm. Upon further discussion they gave her the 07/28/19 rx which was for BID. They informed me she had been notified RX was ready on 07/28/19 and had not picked it up.  I informed them she needs to be on TID Since she has missed doses and MD ordered TID. They informed me may not be covered by insurance and needs to be  authorized by MD.  I called Dr.Stinson and reviewed situation with him.  He approved her RX to be filled early to ensure that she is receiving Suboxone TID as he ordered.  I called [facility] back and spoke with a pharmacist and we discussed above. She states patient will be able to pick up RX written 08/04/19 on 08/12/19 and this will ensure she is taking it TID. They advised she pick up her prescriptions on time.   I called [place] again and again heard message " Your call cannot be completed at this time".  I will send a MYChart message and we will call again in the next few days.  Linda,RN

## 2019-08-07 NOTE — Telephone Encounter (Signed)
Called pt and was unable to LM due to receiving message stating to call back later.  MyChart message sent to inform pt that her Suboxone medication will be avaiable to pick up on 08/12/19.

## 2019-08-08 ENCOUNTER — Encounter (HOSPITAL_COMMUNITY): Payer: Self-pay

## 2019-08-08 ENCOUNTER — Ambulatory Visit (HOSPITAL_COMMUNITY): Payer: Medicaid Other | Attending: Obstetrics and Gynecology

## 2019-08-08 ENCOUNTER — Ambulatory Visit (HOSPITAL_COMMUNITY): Admission: RE | Admit: 2019-08-08 | Payer: Medicaid Other | Source: Ambulatory Visit

## 2019-08-13 ENCOUNTER — Other Ambulatory Visit: Payer: Self-pay | Admitting: Family Medicine

## 2019-08-13 ENCOUNTER — Telehealth: Payer: Self-pay

## 2019-08-13 DIAGNOSIS — F1120 Opioid dependence, uncomplicated: Secondary | ICD-10-CM

## 2019-08-13 NOTE — Telephone Encounter (Signed)
Called patients pharmacy to see about her picking up her Rx for Suboxone. Pharmacy stated that the RX would be ready by 1230pm and the patient can come by to get it then. Attempted to call the patient but phone has a busy signal, couldn't leave any messages.

## 2019-08-15 ENCOUNTER — Ambulatory Visit (HOSPITAL_COMMUNITY): Admission: RE | Admit: 2019-08-15 | Payer: Medicaid Other | Source: Ambulatory Visit

## 2019-08-15 ENCOUNTER — Ambulatory Visit (HOSPITAL_COMMUNITY): Payer: Medicaid Other | Attending: Obstetrics and Gynecology

## 2019-08-18 ENCOUNTER — Ambulatory Visit (INDEPENDENT_AMBULATORY_CARE_PROVIDER_SITE_OTHER): Payer: Medicaid Other | Admitting: Family Medicine

## 2019-08-18 ENCOUNTER — Other Ambulatory Visit: Payer: Self-pay

## 2019-08-18 VITALS — BP 130/83 | HR 82 | Wt >= 6400 oz

## 2019-08-18 DIAGNOSIS — F1910 Other psychoactive substance abuse, uncomplicated: Secondary | ICD-10-CM

## 2019-08-18 DIAGNOSIS — O99323 Drug use complicating pregnancy, third trimester: Secondary | ICD-10-CM

## 2019-08-18 DIAGNOSIS — O99213 Obesity complicating pregnancy, third trimester: Secondary | ICD-10-CM

## 2019-08-18 DIAGNOSIS — Z98891 History of uterine scar from previous surgery: Secondary | ICD-10-CM

## 2019-08-18 DIAGNOSIS — O10919 Unspecified pre-existing hypertension complicating pregnancy, unspecified trimester: Secondary | ICD-10-CM

## 2019-08-18 DIAGNOSIS — O0990 Supervision of high risk pregnancy, unspecified, unspecified trimester: Secondary | ICD-10-CM

## 2019-08-18 DIAGNOSIS — F1110 Opioid abuse, uncomplicated: Secondary | ICD-10-CM

## 2019-08-18 DIAGNOSIS — Z3A35 35 weeks gestation of pregnancy: Secondary | ICD-10-CM
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
## 2019-08-22 ENCOUNTER — Ambulatory Visit (HOSPITAL_COMMUNITY)
Admission: RE | Admit: 2019-08-22 | Discharge: 2019-08-22 | Disposition: A | Discharge status: Home / Self Care | Payer: Medicaid Other | Source: Ambulatory Visit | Attending: Obstetrics and Gynecology | Admitting: Obstetrics and Gynecology

## 2019-08-22 ENCOUNTER — Ambulatory Visit (HOSPITAL_COMMUNITY): Payer: Medicaid Other | Admitting: *Deleted

## 2019-08-22 ENCOUNTER — Encounter (HOSPITAL_COMMUNITY): Payer: Self-pay

## 2019-08-22 ENCOUNTER — Other Ambulatory Visit: Payer: Self-pay

## 2019-08-22 VITALS — BP 138/69 | HR 77 | Temp 98.1°F

## 2019-08-22 DIAGNOSIS — Z3A36 36 weeks gestation of pregnancy: Secondary | ICD-10-CM

## 2019-08-22 DIAGNOSIS — O10919 Unspecified pre-existing hypertension complicating pregnancy, unspecified trimester: Secondary | ICD-10-CM | POA: Insufficient documentation

## 2019-08-22 DIAGNOSIS — O99213 Obesity complicating pregnancy, third trimester: Secondary | ICD-10-CM

## 2019-08-22 DIAGNOSIS — O99323 Drug use complicating pregnancy, third trimester: Secondary | ICD-10-CM

## 2019-08-22 DIAGNOSIS — F1990 Other psychoactive substance use, unspecified, uncomplicated: Secondary | ICD-10-CM

## 2019-08-22 DIAGNOSIS — O09893 Supervision of other high risk pregnancies, third trimester: Secondary | ICD-10-CM

## 2019-08-22 DIAGNOSIS — O10013 Pre-existing essential hypertension complicating pregnancy, third trimester: Secondary | ICD-10-CM

## 2019-08-22 DIAGNOSIS — O34219 Maternal care for unspecified type scar from previous cesarean delivery: Secondary | ICD-10-CM

## 2019-08-22 DIAGNOSIS — O09293 Supervision of pregnancy with other poor reproductive or obstetric history, third trimester: Secondary | ICD-10-CM

## 2019-08-28 ENCOUNTER — Inpatient Hospital Stay (HOSPITAL_COMMUNITY)
Admission: AD | Admit: 2019-08-28 | Discharge: 2019-08-28 | Disposition: A | Discharge status: Home / Self Care | Payer: Medicaid Other | Attending: Obstetrics and Gynecology | Admitting: Obstetrics and Gynecology

## 2019-08-28 ENCOUNTER — Other Ambulatory Visit: Payer: Self-pay

## 2019-08-28 ENCOUNTER — Encounter (HOSPITAL_COMMUNITY): Payer: Self-pay

## 2019-08-28 ENCOUNTER — Other Ambulatory Visit (HOSPITAL_COMMUNITY)
Admission: RE | Admit: 2019-08-28 | Discharge: 2019-08-28 | Disposition: A | Discharge status: Home / Self Care | Payer: Medicaid Other | Source: Ambulatory Visit | Attending: Obstetrics & Gynecology | Admitting: Obstetrics & Gynecology

## 2019-08-28 ENCOUNTER — Ambulatory Visit (INDEPENDENT_AMBULATORY_CARE_PROVIDER_SITE_OTHER): Payer: Medicaid Other | Admitting: Obstetrics & Gynecology

## 2019-08-28 ENCOUNTER — Encounter: Payer: Self-pay | Admitting: Obstetrics & Gynecology

## 2019-08-28 VITALS — BP 158/93 | HR 83 | Wt >= 6400 oz

## 2019-08-28 DIAGNOSIS — O0990 Supervision of high risk pregnancy, unspecified, unspecified trimester: Secondary | ICD-10-CM

## 2019-08-28 DIAGNOSIS — Z3A36 36 weeks gestation of pregnancy: Secondary | ICD-10-CM

## 2019-08-28 DIAGNOSIS — O0993 Supervision of high risk pregnancy, unspecified, third trimester: Secondary | ICD-10-CM

## 2019-08-28 DIAGNOSIS — Z3A00 Weeks of gestation of pregnancy not specified: Secondary | ICD-10-CM | POA: Insufficient documentation

## 2019-08-28 DIAGNOSIS — O10913 Unspecified pre-existing hypertension complicating pregnancy, third trimester: Secondary | ICD-10-CM

## 2019-08-28 DIAGNOSIS — O99323 Drug use complicating pregnancy, third trimester: Secondary | ICD-10-CM

## 2019-08-28 DIAGNOSIS — Z98891 History of uterine scar from previous surgery: Secondary | ICD-10-CM

## 2019-08-28 DIAGNOSIS — F1120 Opioid dependence, uncomplicated: Secondary | ICD-10-CM

## 2019-08-28 DIAGNOSIS — O10919 Unspecified pre-existing hypertension complicating pregnancy, unspecified trimester: Secondary | ICD-10-CM | POA: Diagnosis not present

## 2019-08-28 DIAGNOSIS — O99210 Obesity complicating pregnancy, unspecified trimester: Secondary | ICD-10-CM | POA: Diagnosis not present

## 2019-08-28 DIAGNOSIS — O99213 Obesity complicating pregnancy, third trimester: Secondary | ICD-10-CM

## 2019-08-28 LAB — COMPREHENSIVE METABOLIC PANEL
ALT: 8 U/L (ref 0–44)
AST: 10 U/L — ABNORMAL LOW (ref 15–41)
Albumin: 2.5 g/dL — ABNORMAL LOW (ref 3.5–5.0)
Alkaline Phosphatase: 60 U/L (ref 38–126)
Anion gap: 8 (ref 5–15)
BUN: 5 mg/dL — ABNORMAL LOW (ref 6–20)
CO2: 22 mmol/L (ref 22–32)
Calcium: 8.4 mg/dL — ABNORMAL LOW (ref 8.9–10.3)
Chloride: 105 mmol/L (ref 98–111)
Creatinine, Ser: 0.55 mg/dL (ref 0.44–1.00)
GFR calc Af Amer: >60 mL/min (ref >60)
GFR calc non Af Amer: >60 mL/min (ref >60)
Glucose, Bld: 94 mg/dL (ref 70–99)
Potassium: 3.8 mmol/L (ref 3.5–5.1)
Sodium: 135 mmol/L (ref 135–145)
Total Bilirubin: 0.4 mg/dL (ref 0.3–1.2)
Total Protein: 5.5 g/dL — ABNORMAL LOW (ref 6.5–8.1)

## 2019-08-28 LAB — URINALYSIS, ROUTINE W REFLEX MICROSCOPIC
Bilirubin Urine: NEGATIVE
Glucose, UA: NEGATIVE mg/dL
Hgb urine dipstick: NEGATIVE
Ketones, ur: NEGATIVE mg/dL
Nitrite: NEGATIVE
Protein, ur: NEGATIVE mg/dL
Specific Gravity, Urine: 1.016 (ref 1.005–1.030)
pH: 7 (ref 5.0–8.0)

## 2019-08-28 LAB — CBC
HCT: 33.3 % — ABNORMAL LOW (ref 36.0–46.0)
Hemoglobin: 10.4 g/dL — ABNORMAL LOW (ref 12.0–15.0)
MCH: 27.1 pg (ref 26.0–34.0)
MCHC: 31.2 g/dL (ref 30.0–36.0)
MCV: 86.7 fL (ref 80.0–100.0)
Platelets: 217 10*3/uL (ref 150–400)
RBC: 3.84 MIL/uL — ABNORMAL LOW (ref 3.87–5.11)
RDW: 14.3 % (ref 11.5–15.5)
WBC: 8.6 10*3/uL (ref 4.0–10.5)
nRBC: 0 % (ref 0.0–0.2)

## 2019-08-28 LAB — PROTEIN / CREATININE RATIO, URINE
Creatinine, Urine: 133.83 mg/dL
Protein Creatinine Ratio: 0.11 mg/mg{Cre} (ref 0.00–0.15)
Total Protein, Urine: 15 mg/dL

## 2019-08-28 MED ORDER — BUPRENORPHINE HCL-NALOXONE HCL 4-1 MG SL FILM: 1.0000 | ORAL_FILM | Freq: Three times a day (TID) | SUBLINGUAL | 0 refills | Status: DC

## 2019-08-28 NOTE — Discharge Instructions (Signed)
Hypertension During Pregnancy °Hypertension is also called high blood pressure. High blood pressure means that the force of your blood moving in your body is too strong. It can cause problems for you and your baby. Different types of high blood pressure can happen during pregnancy. The types are: °· High blood pressure before you got pregnant. This is called chronic hypertension.  This can continue during your pregnancy. Your doctor will want to keep checking your blood pressure. You may need medicine to keep your blood pressure under control while you are pregnant. You will need follow-up visits after you have your baby. °· High blood pressure that goes up during pregnancy when it was normal before. This is called gestational hypertension. It will usually get better after you have your baby, but your doctor will need to watch your blood pressure to make sure that it is getting better. °· Very high blood pressure during pregnancy. This is called preeclampsia. Very high blood pressure is an emergency that needs to be checked and treated right away. °· You may develop very high blood pressure after giving birth. This is called postpartum preeclampsia. This usually occurs within 48 hours after childbirth but may occur up to 6 weeks after giving birth. This is rare. °How does this affect me? °If you have high blood pressure during pregnancy, you have a higher chance of developing high blood pressure: °· As you get older. °· If you get pregnant again. °In some cases, high blood pressure during pregnancy can cause: °· Stroke. °· Heart attack. °· Damage to the kidneys, lungs, or liver. °· Preeclampsia. °· Jerky movements you cannot control (convulsions or seizures). °· Problems with the placenta. °How does this affect my baby? °Your baby may: °· Be born early. °· Not weigh as much as he or she should. °· Not handle labor well, leading to a c-section birth. °What are the risks? °· Having high blood pressure during a past  pregnancy. °· Being overweight. °· Being 35 years old or older. °· Being pregnant for the first time. °· Being pregnant with more than one baby. °· Becoming pregnant using fertility methods, such as IVF. °· Having other problems, such as diabetes, or kidney disease. °· Having family members who have high blood pressure. °What can I do to lower my risk? ° °· Keep a healthy weight. °· Eat a healthy diet. °· Follow what your doctor tells you about treating any medical problems that you had before becoming pregnant. °It is very important to go to all of your doctor visits. Your doctor will check your blood pressure and make sure that your pregnancy is progressing as it should. Treatment should start early if a problem is found. °How is this treated? °Treatment for high blood pressure during pregnancy can differ depending on the type of high blood pressure you have and how serious it is. °· You may need to take blood pressure medicine. °· If you have been taking medicine for your blood pressure, you may need to change the medicine during pregnancy if it is not safe for your baby. °· If your doctor thinks that you could get very high blood pressure, he or she may tell you to take a low-dose aspirin during your pregnancy. °· If you have very high blood pressure, you may need to stay in the hospital so you and your baby can be watched closely. You may also need to take medicine to lower your blood pressure. This medicine may be given by mouth   or through an IV tube. °· In some cases, if your condition gets worse, you may need to have your baby early. °Follow these instructions at home: °Eating and drinking ° °· Drink enough fluid to keep your pee (urine) pale yellow. °· Avoid caffeine. °Lifestyle °· Do not use any products that contain nicotine or tobacco, such as cigarettes, e-cigarettes, and chewing tobacco. If you need help quitting, ask your doctor. °· Do not use alcohol or drugs. °· Avoid stress. °· Rest and get plenty  of sleep. °· Regular exercise can help. Ask your doctor what kinds of exercise are best for you. °General instructions °· Take over-the-counter and prescription medicines only as told by your doctor. °· Keep all prenatal and follow-up visits as told by your doctor. This is important. °Contact a doctor if: °· You have symptoms that your doctor told you to watch for, such as: °? Headaches. °? Nausea. °? Vomiting. °? Belly (abdominal) pain. °? Dizziness. °? Light-headedness. °Get help right away if: °· You have: °? Very bad belly pain that does not get better with treatment. °? A very bad headache that does not get better. °? Vomiting that does not get better. °? Sudden, fast weight gain. °? Sudden swelling in your hands, ankles, or face. °? Bleeding from your vagina. °? Blood in your pee. °? Blurry vision. °? Double vision. °? Shortness of breath. °? Chest pain. °? Weakness on one side of your body. °? Trouble talking. °· Your baby is not moving as much as usual. °Summary °· High blood pressure is also called hypertension. °· High blood pressure means that the force of your blood moving in your body is too strong. °· High blood pressure can cause problems for you and your baby. °· Keep all follow-up visits as told by your doctor. This is important. °This information is not intended to replace advice given to you by your health care provider. Make sure you discuss any questions you have with your health care provider. °Document Released: 11/11/2010 Document Revised: 01/30/2019 Document Reviewed: 11/05/2018 °Elsevier Patient Education © 2020 [employer] Inc[employer] ° °

## 2019-08-28 NOTE — Patient Instructions (Signed)
Return to office for any scheduled appointments. Call the office or go to the MAU at [facility] if:  You begin to have strong, frequent contractions  Your water breaks.  Sometimes it is a big gush of fluid, sometimes it is just a trickle that keeps getting your panties wet or running down your legs  You have vaginal bleeding.  It is normal to have a small amount of spotting if your cervix was checked.   You do not feel your baby moving like normal.  If you do not, get something to eat and drink and lay down and focus on feeling your baby move.   If your baby is still not moving like normal, you should call the office or go to MAU.  Any other obstetric concerns.   

## 2019-08-28 NOTE — MAU Provider Note (Signed)
Chief Complaint:  Hypertension   First Provider Initiated Contact with Patient 08/28/19 2007     HPI: Hannah Ryan is a 28 y.o. G2P1001 at 7736w6dwho presents to maternity admissions reporting elevated BPs in office today.  Denies headache or other symptoms.  Scheduled for C/S at 39 wks. She reports good fetal movement, denies LOF, vaginal bleeding, vaginal itching/burning, urinary symptoms, h/a, dizziness, n/v, diarrhea, constipation or fever/chills.  She denies headache, visual changes or RUQ abdominal pain.  Hypertension This is a recurrent problem. The problem has been waxing and waning since onset. Associated symptoms include peripheral edema. Pertinent negatives include no anxiety, blurred vision, chest pain, headaches or shortness of breath. Treatments tried: Labetalol. There are no compliance problems.     RN note: Pt presents to MAU stating she was sent over from the office today and had two elevated BP's. Pt states they told her to come here for evaluation and blood work. Pt reports her BP in the office was 152/104 and 158/93. Pt has swelling in her ankles. Pt denies any pain. No HA. Blurry vision, or RUQ pain. No bleeding or LOF. +FM.   Past Medical History: Past Medical History:  Diagnosis Date  . Anxiety    per patient/family  . Chlamydia   . Depression    per patient/family  . Gonorrhea   . Obesity   . Trichomonas infection     Past obstetric history: OB History  Gravida Para Term Preterm AB Living  2 1 1     1   SAB TAB Ectopic Multiple Live Births          1    # Outcome Date GA Lbr Len/2nd Weight Sex Delivery Anes PTL Lv  2 Current           1 Term 05/08/18 1966w2d  2940 g M CS-LTranv   LIV    Past Surgical History: Past Surgical History:  Procedure Laterality Date  . ADENOIDECTOMY    . CESAREAN SECTION    . FOOT SURGERY     left  . IRRIGATION AND DEBRIDEMENT ABSCESS  01/08/2012   Procedure: IRRIGATION AND DEBRIDEMENT ABSCESS;  Surgeon: Fabio BeringBrent C Ziegler,  MD;  Location: [facility];  Service: General;  Laterality: Right;  . TONSILLECTOMY      Family History: Family History  Problem Relation Age of Onset  . Diabetes Maternal Grandmother   . Hypertension Maternal Grandmother   . Hypertension Father   . Hypertension Mother   . Diabetes Mother   . Autism Brother   . Lupus Paternal Aunt   . Lupus Cousin     Social History: Social History   Tobacco Use  . Smoking status: Current Some Day Smoker    Packs/day: 0.25    Years: 3.00    Pack years: 0.75    Types: Cigarettes  . Smokeless tobacco: Never Used  Substance Use Topics  . Alcohol use: No    Frequency: Never  . Drug use: Not Currently    Types: Marijuana, Cocaine, Hydrocodone    Comment: not during pregnancy per pt    Allergies: No Known Allergies  Meds:  Medications Prior to Admission  Medication Sig Dispense Refill Last Dose  . aspirin 81 MG tablet Take 2 tablets (162 mg total) by mouth daily. 60 tablet 6 08/28/2019 at Unknown time  . Blood Pressure Monitoring (BLOOD PRESSURE MONITOR AUTOMAT) DEVI Take BP at home daily.  Alert us if >140/90 more than once. 1 Device 0 08/28/2019 at Unknown time  .  Buprenorphine HCl-Naloxone HCl (SUBOXONE) 4-1 MG FILM Place 1 Film under the tongue 3 (three) times daily. 42 each 0 08/28/2019 at Unknown time  . labetalol (NORMODYNE) 200 MG tablet Take 2 tablets (400 mg total) by mouth 2 (two) times daily. 120 tablet 3 08/28/2019 at Unknown time  . Prenatal Vit-Fe Fumarate-FA (PREPLUS) 27-1 MG TABS Take 1 tablet by mouth daily. 30 tablet 12 08/28/2019 at Unknown time    I have reviewed patient's Past Medical Hx, Surgical Hx, Family Hx, Social Hx, medications and allergies.   ROS:  Review of Systems  Eyes: Negative for blurred vision.  Respiratory: Negative for shortness of breath.   Cardiovascular: Negative for chest pain.  Neurological: Negative for headaches.   Other systems negative  Physical Exam   Patient Vitals for the past 24 hrs:   BP Temp Temp src Pulse Resp Height Weight  08/28/19 1951 (!) 151/72 98.3 F (36.8 C) Oral 88 20 5\' 8"  (1.727 m) (!) 184 kg   Vitals:   08/28/19 2101 08/28/19 2116 08/28/19 2131 08/28/19 2142  BP: 134/77 133/82 137/74   Pulse: 78 81 79   Resp:      Temp:    98.5 F (36.9 C)  TempSrc:    Oral  Weight:      Height:        Constitutional: Well-developed, well-nourished female in no acute distress.  Cardiovascular: normal rate and rhythm Respiratory: normal effort, clear to auscultation bilaterally GI: Abd soft, non-tender, gravid appropriate for gestational age.   No rebound or guarding. MS: Extremities nontender, 1+ edema, normal ROM Neurologic: Alert and oriented x 4.    DTRs 2+ with no clonus GU: Neg CVAT.  PELVIC EXAM: Deferred   FHT:  Baseline 140 , moderate variability, accelerations present, 1 small variable deceleration Contractions: Rare   Labs: Results for orders placed or performed during the hospital encounter of 08/28/19 (from the past 24 hour(s))  Protein / creatinine ratio, urine     Status: None   Collection Time: 08/28/19  8:02 PM  Result Value Ref Range   Creatinine, Urine 133.83 mg/dL   Total Protein, Urine 15 mg/dL   Protein Creatinine Ratio 0.11 0.00 - 0.15 mg/mg[Cre]  CBC     Status: Abnormal   Collection Time: 08/28/19  8:07 PM  Result Value Ref Range   WBC 8.6 4.0 - 10.5 K/uL   RBC 3.84 (L) 3.87 - 5.11 MIL/uL   Hemoglobin 10.4 (L) 12.0 - 15.0 g/dL   HCT 33.3 (L) 36.0 - 46.0 %   MCV 86.7 80.0 - 100.0 fL   MCH 27.1 26.0 - 34.0 pg   MCHC 31.2 30.0 - 36.0 g/dL   RDW 14.3 11.5 - 15.5 %   Platelets 217 150 - 400 K/uL   nRBC 0.0 0.0 - 0.2 %  Comprehensive metabolic panel     Status: Abnormal   Collection Time: 08/28/19  8:07 PM  Result Value Ref Range   Sodium 135 135 - 145 mmol/L   Potassium 3.8 3.5 - 5.1 mmol/L   Chloride 105 98 - 111 mmol/L   CO2 22 22 - 32 mmol/L   Glucose, Bld 94 70 - 99 mg/dL   BUN 5 (L) 6 - 20 mg/dL   Creatinine, Ser 0.55  0.44 - 1.00 mg/dL   Calcium 8.4 (L) 8.9 - 10.3 mg/dL   Total Protein 5.5 (L) 6.5 - 8.1 g/dL   Albumin 2.5 (L) 3.5 - 5.0 g/dL   AST 10 (L) 15 -  41 U/L   ALT 8 0 - 44 U/L   Alkaline Phosphatase 60 38 - 126 U/L   Total Bilirubin 0.4 0.3 - 1.2 mg/dL   GFR calc non Af Amer >60 >60 mL/min   GFR calc Af Amer >60 >60 mL/min   Anion gap 8 5 - 15  Urinalysis, Routine w reflex microscopic     Status: Abnormal   Collection Time: 08/28/19  8:30 PM  Result Value Ref Range   Color, Urine YELLOW YELLOW   APPearance HAZY (A) CLEAR   Specific Gravity, Urine 1.016 1.005 - 1.030   pH 7.0 5.0 - 8.0   Glucose, UA NEGATIVE NEGATIVE mg/dL   Hgb urine dipstick NEGATIVE NEGATIVE   Bilirubin Urine NEGATIVE NEGATIVE   Ketones, ur NEGATIVE NEGATIVE mg/dL   Protein, ur NEGATIVE NEGATIVE mg/dL   Nitrite NEGATIVE NEGATIVE   Leukocytes,Ua SMALL (A) NEGATIVE   RBC / HPF 0-5 0 - 5 RBC/hpf   WBC, UA 0-5 0 - 5 WBC/hpf   Bacteria, UA FEW (A) NONE SEEN   Squamous Epithelial / LPF 0-5 0 - 5   Mucus PRESENT    Trichomonas, UA PRESENT (A) NONE SEEN    O/Positive/-- (05/19 1636)  Imaging:    MAU Course/MDM: I have ordered labs and reviewed results.  NST reviewed, reassuring. There was a single small variable at beginning of tracing.  Monitored for 1.5 hours afterward with reactive tracing throughout.  Consult Dr Jolayne Panther with presentation, exam findings and test results.  Reviewed normal results and good control of BPs throughout stay Discharged home with followup as scheduled  Assessment: [facility]UP at [redacted]w[redacted]d Chronic Hypertension in pregnancy No evidence of preeclampsia  Plan: Discharge home Preeclampsia precautions Dr Jolayne Panther put in renewal of Suboxone to pharmacy Labor precautions and fetal kick counts Follow up in Office for prenatal visits and recheck  Encouraged to return here or to other Urgent Care/ED if she develops worsening of symptoms, increase in pain, fever, or other concerning symptoms.   Pt  stable at time of discharge.  Wynelle Bourgeois CNM, MSN Certified Nurse-Midwife 08/28/2019 8:07 PM
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
## 2019-08-28 NOTE — MAU Note (Addendum)
Pt presents to MAU stating she was sent over from the office today and had two elevated BP's. Pt states they told her to come here for evaluation and blood work. Pt reports her BP in the office was 152/104 and 158/93. Pt has swelling in her ankles. Pt denies any pain. No HA. Blurry vision, or RUQ pain. No bleeding or LOF. +FM.

## 2019-08-29 ENCOUNTER — Ambulatory Visit (HOSPITAL_COMMUNITY): Payer: Medicaid Other | Admitting: *Deleted

## 2019-08-29 ENCOUNTER — Encounter (HOSPITAL_COMMUNITY): Payer: Self-pay

## 2019-08-29 ENCOUNTER — Ambulatory Visit (HOSPITAL_COMMUNITY)
Admission: RE | Admit: 2019-08-29 | Discharge: 2019-08-29 | Disposition: A | Discharge status: Home / Self Care | Payer: Medicaid Other | Source: Ambulatory Visit | Attending: Obstetrics and Gynecology | Admitting: Obstetrics and Gynecology

## 2019-08-29 VITALS — BP 132/85 | HR 90 | Temp 98.0°F

## 2019-08-29 DIAGNOSIS — Z362 Encounter for other antenatal screening follow-up: Secondary | ICD-10-CM | POA: Diagnosis not present

## 2019-08-29 DIAGNOSIS — O99213 Obesity complicating pregnancy, third trimester: Secondary | ICD-10-CM

## 2019-08-29 DIAGNOSIS — O10919 Unspecified pre-existing hypertension complicating pregnancy, unspecified trimester: Secondary | ICD-10-CM

## 2019-08-29 DIAGNOSIS — O34219 Maternal care for unspecified type scar from previous cesarean delivery: Secondary | ICD-10-CM

## 2019-08-29 DIAGNOSIS — O09893 Supervision of other high risk pregnancies, third trimester: Secondary | ICD-10-CM

## 2019-08-29 DIAGNOSIS — Z3A37 37 weeks gestation of pregnancy: Secondary | ICD-10-CM

## 2019-08-29 DIAGNOSIS — O10013 Pre-existing essential hypertension complicating pregnancy, third trimester: Secondary | ICD-10-CM

## 2019-08-29 DIAGNOSIS — F1990 Other psychoactive substance use, unspecified, uncomplicated: Secondary | ICD-10-CM | POA: Diagnosis not present

## 2019-08-29 DIAGNOSIS — O09293 Supervision of pregnancy with other poor reproductive or obstetric history, third trimester: Secondary | ICD-10-CM

## 2019-08-29 DIAGNOSIS — O99323 Drug use complicating pregnancy, third trimester: Secondary | ICD-10-CM

## 2019-09-01 ENCOUNTER — Other Ambulatory Visit (HOSPITAL_COMMUNITY): Payer: Self-pay | Admitting: *Deleted

## 2019-09-01 DIAGNOSIS — O10913 Unspecified pre-existing hypertension complicating pregnancy, third trimester: Secondary | ICD-10-CM

## 2019-09-01 LAB — GC/CHLAMYDIA PROBE AMP (~~LOC~~) NOT AT ARMC
Chlamydia: NEGATIVE
Comment: NEGATIVE
Comment: NORMAL
Neisseria Gonorrhea: NEGATIVE

## 2019-09-01 LAB — CULTURE, BETA STREP (GROUP B ONLY): Strep Gp B Culture: NEGATIVE

## 2019-09-02 NOTE — H&P (Signed)
  Hannah Ryan is an 28 y.o. G2P1001 [redacted]w[redacted]d female.   Chief Complaint: previous C-section HPI: Prior C-section x 1, for elective repeat. PNC complicated by poly-substance use and CHTN. Nml growth.  Past Medical History:  Diagnosis Date  . Anxiety    per patient/family  . Chlamydia   . Depression    per patient/family  . Gonorrhea   . Obesity   . Trichomonas infection     Past Surgical History:  Procedure Laterality Date  . ADENOIDECTOMY    . CESAREAN SECTION    . FOOT SURGERY     left  . IRRIGATION AND DEBRIDEMENT ABSCESS  01/08/2012   Procedure: IRRIGATION AND DEBRIDEMENT ABSCESS;  Surgeon: Donato Heinz, MD;  Location: [facility];  Service: General;  Laterality: Right;  . TONSILLECTOMY      Family History  Problem Relation Age of Onset  . Diabetes Maternal Grandmother   . Hypertension Maternal Grandmother   . Hypertension Father   . Hypertension Mother   . Diabetes Mother   . Autism Brother   . Lupus Paternal Aunt   . Lupus Cousin    Social History:  reports that she has been smoking cigarettes. She has a 0.75 pack-year smoking history. She has never used smokeless tobacco. She reports current drug use. Drugs: Marijuana, Hydrocodone, and Cocaine. She reports that she does not drink alcohol.   No Known Allergies  No medications prior to admission.     Pertinent items are noted in HPI.  Last menstrual period 12/13/2018, not currently breastfeeding. General appearance: alert and morbidly obese Head: Normocephalic, without obvious abnormality, atraumatic Neck: supple, symmetrical, trachea midline Lungs: normal effort Heart: regular rate and rhythm Abdomen: soft, non-tender; bowel sounds normal; no masses,  no organomegaly Extremities: extremities normal, atraumatic, no cyanosis or edema Skin: Skin color, texture, turgor normal. No rashes or lesions Neurologic: Grossly normal   Lab Results  Component Value Date   WBC 8.6 08/28/2019   HGB 10.4 (L) 08/28/2019    HCT 33.3 (L) 08/28/2019   MCV 86.7 08/28/2019   PLT 217 08/28/2019         ABO, Rh: O/Positive/-- (05/19 1636)  Antibody: Negative (05/19 1636)  Rubella: 3.87 (05/19 1636)  RPR: Non Reactive (09/04 0839)  HBsAg: Negative (05/19 1636)  HIV: Non Reactive (09/04 0839)  GBS: Negative/-- (11/05 1700)     Assessment/Plan Active Problems:   Chronic hypertension affecting pregnancy   Polysubstance abuse (HCC)   Previous cesarean section   Smoker   Suboxone maintenance treatment complicating pregnancy, antepartum, third trimester (HCC)  For RLTCS. Risks include but are not limited to bleeding, infection, injury to surrounding structures, including bowel, bladder and ureters, blood clots, and death.  Likelihood of success is high.  Donnamae Jude 09/02/2019, 11:18 AM

## 2019-09-02 NOTE — H&P (View-Only) (Signed)
  Hannah Ryan is an 28 y.o. G2P1001 [redacted]w[redacted]d female.   Chief Complaint: previous C-section HPI: Prior C-section x 1, for elective repeat. PNC complicated by poly-substance use and CHTN. Nml growth.  Past Medical History:  Diagnosis Date  . Anxiety    per patient/family  . Chlamydia   . Depression    per patient/family  . Gonorrhea   . Obesity   . Trichomonas infection     Past Surgical History:  Procedure Laterality Date  . ADENOIDECTOMY    . CESAREAN SECTION    . FOOT SURGERY     left  . IRRIGATION AND DEBRIDEMENT ABSCESS  01/08/2012   Procedure: IRRIGATION AND DEBRIDEMENT ABSCESS;  Surgeon: Brent C Ziegler, MD;  Location: [facility];  Service: General;  Laterality: Right;  . TONSILLECTOMY      Family History  Problem Relation Age of Onset  . Diabetes Maternal Grandmother   . Hypertension Maternal Grandmother   . Hypertension Father   . Hypertension Mother   . Diabetes Mother   . Autism Brother   . Lupus Paternal Aunt   . Lupus Cousin    Social History:  reports that she has been smoking cigarettes. She has a 0.75 pack-year smoking history. She has never used smokeless tobacco. She reports current drug use. Drugs: Marijuana, Hydrocodone, and Cocaine. She reports that she does not drink alcohol.   No Known Allergies  No medications prior to admission.     Pertinent items are noted in HPI.  Last menstrual period 12/13/2018, not currently breastfeeding. General appearance: alert and morbidly obese Head: Normocephalic, without obvious abnormality, atraumatic Neck: supple, symmetrical, trachea midline Lungs: normal effort Heart: regular rate and rhythm Abdomen: soft, non-tender; bowel sounds normal; no masses,  no organomegaly Extremities: extremities normal, atraumatic, no cyanosis or edema Skin: Skin color, texture, turgor normal. No rashes or lesions Neurologic: Grossly normal   Lab Results  Component Value Date   WBC 8.6 08/28/2019   HGB 10.4 (L) 08/28/2019    HCT 33.3 (L) 08/28/2019   MCV 86.7 08/28/2019   PLT 217 08/28/2019         ABO, Rh: O/Positive/-- (05/19 1636)  Antibody: Negative (05/19 1636)  Rubella: 3.87 (05/19 1636)  RPR: Non Reactive (09/04 0839)  HBsAg: Negative (05/19 1636)  HIV: Non Reactive (09/04 0839)  GBS: Negative/-- (11/05 1700)     Assessment/Plan Active Problems:   Chronic hypertension affecting pregnancy   Polysubstance abuse (HCC)   Previous cesarean section   Smoker   Suboxone maintenance treatment complicating pregnancy, antepartum, third trimester (HCC)  For RLTCS. Risks include but are not limited to bleeding, infection, injury to surrounding structures, including bowel, bladder and ureters, blood clots, and death.  Likelihood of success is high.  Zakariyya Helfman S Jennifer Holland 09/02/2019, 11:18 AM  

## 2019-09-04 ENCOUNTER — Telehealth: Payer: Medicaid Other | Admitting: Family Medicine

## 2019-09-04 ENCOUNTER — Telehealth (HOSPITAL_COMMUNITY): Payer: Self-pay | Admitting: *Deleted

## 2019-09-04 ENCOUNTER — Other Ambulatory Visit: Payer: Self-pay

## 2019-09-04 ENCOUNTER — Encounter: Payer: Self-pay | Admitting: Family Medicine

## 2019-09-04 ENCOUNTER — Telehealth: Payer: Self-pay | Admitting: Family Medicine

## 2019-09-04 DIAGNOSIS — Z5329 Procedure and treatment not carried out because of patient's decision for other reasons: Secondary | ICD-10-CM

## 2019-09-04 DIAGNOSIS — Z91199 Patient's noncompliance with other medical treatment and regimen due to unspecified reason: Secondary | ICD-10-CM

## 2019-09-04 NOTE — Progress Notes (Signed)
Attempted to call pt at 1350; VM left stating I was calling to check pt in for virtual appt and will call again in 15 minutes.   Second attempt to call pt at 1405; VM left stating pt will need to call the office to reschedule her appt.

## 2019-09-04 NOTE — Telephone Encounter (Signed)
Preadmission screen  

## 2019-09-04 NOTE — Telephone Encounter (Signed)
Attempted to call patient to get her rescheduled for her missed ob appointment. No answer , left voicemail for patient to give the office a call back to be rescheduled. No show letter mailed °

## 2019-09-04 NOTE — Progress Notes (Signed)
Patient did not keep appointment today. She will be called to reschedule.  

## 2019-09-05 ENCOUNTER — Other Ambulatory Visit: Payer: Self-pay | Admitting: Family Medicine

## 2019-09-05 ENCOUNTER — Telehealth: Payer: Self-pay | Admitting: Obstetrics and Gynecology

## 2019-09-05 ENCOUNTER — Ambulatory Visit (HOSPITAL_COMMUNITY)
Admission: RE | Admit: 2019-09-05 | Discharge: 2019-09-05 | Disposition: A | Discharge status: Home / Self Care | Payer: Medicaid Other | Source: Ambulatory Visit | Attending: Obstetrics | Admitting: Obstetrics

## 2019-09-05 ENCOUNTER — Other Ambulatory Visit: Payer: Self-pay

## 2019-09-05 ENCOUNTER — Ambulatory Visit (HOSPITAL_COMMUNITY): Payer: Medicaid Other | Admitting: *Deleted

## 2019-09-05 ENCOUNTER — Encounter (HOSPITAL_COMMUNITY): Payer: Self-pay

## 2019-09-05 ENCOUNTER — Other Ambulatory Visit (HOSPITAL_COMMUNITY): Payer: Self-pay | Admitting: Obstetrics and Gynecology

## 2019-09-05 VITALS — BP 133/83 | HR 101 | Temp 97.4°F

## 2019-09-05 DIAGNOSIS — O99213 Obesity complicating pregnancy, third trimester: Secondary | ICD-10-CM

## 2019-09-05 DIAGNOSIS — O10919 Unspecified pre-existing hypertension complicating pregnancy, unspecified trimester: Secondary | ICD-10-CM | POA: Insufficient documentation

## 2019-09-05 DIAGNOSIS — O34219 Maternal care for unspecified type scar from previous cesarean delivery: Secondary | ICD-10-CM

## 2019-09-05 DIAGNOSIS — F1990 Other psychoactive substance use, unspecified, uncomplicated: Secondary | ICD-10-CM | POA: Diagnosis not present

## 2019-09-05 DIAGNOSIS — O99323 Drug use complicating pregnancy, third trimester: Secondary | ICD-10-CM | POA: Diagnosis not present

## 2019-09-05 DIAGNOSIS — O09293 Supervision of pregnancy with other poor reproductive or obstetric history, third trimester: Secondary | ICD-10-CM

## 2019-09-05 DIAGNOSIS — O10013 Pre-existing essential hypertension complicating pregnancy, third trimester: Secondary | ICD-10-CM

## 2019-09-05 DIAGNOSIS — O09893 Supervision of other high risk pregnancies, third trimester: Secondary | ICD-10-CM | POA: Diagnosis not present

## 2019-09-05 DIAGNOSIS — Z3A38 38 weeks gestation of pregnancy: Secondary | ICD-10-CM

## 2019-09-05 NOTE — Patient Instructions (Signed)
Lorenna Lurry  09/05/2019   Your procedure is scheduled on:  09/12/2019  Arrive at 35 at Entrance C on [address] at [facility]  and [address]. You are invited to use the FREE valet parking or use the Visitor's parking deck.  Pick up the phone at the desk and dial [phone].  Call this number if you have problems the morning of surgery: [phone]  Remember:   Do not eat food:(After Midnight) Desps de medianoche.  Do not drink clear liquids: (After Midnight) Desps de medianoche.  Take these medicines the morning of surgery with A SIP OF WATER:  Take labetalol and suboxone as prescribed.     Do not wear jewelry, make-up or nail polish.  Do not wear lotions, powders, or perfumes. Do not wear deodorant.  Do not shave 48 hours prior to surgery.  Do not bring valuables to the hospital.  [facility] is not   responsible for any belongings or valuables brought to the hospital.  Contacts, dentures or bridgework may not be worn into surgery.  Leave suitcase in the car. After surgery it may be brought to your room.  For patients admitted to the hospital, checkout time is 11:00 AM the day of              discharge.      Please read over the following fact sheets that you were given:     Preparing for Surgery

## 2019-09-05 NOTE — Procedures (Signed)
Hannah Ryan 07-Apr-1991 [redacted]w[redacted]d  Fetus A Non-Stress Test Interpretation for 09/05/19  Indication: Unsatisfactory BPP  Fetal Heart Rate A Mode: External Baseline Rate (A): 140 bpm Variability: Moderate Accelerations: 15 x 15 Decelerations: None Multiple birth?: No  Uterine Activity Mode: Palpation, Toco Contraction Frequency (min): none Resting Tone Palpated: Relaxed Resting Time: Adequate  Interpretation (Fetal Testing) Nonstress Test Interpretation: Reactive Comments: Reviewed EFM tracing with Dr.Shankar

## 2019-09-05 NOTE — Telephone Encounter (Signed)
Patient had oligohydramnios on u/s , BPP 8/10 . Dr Donalee Citrin recommends moving her c/s to early next week , either Monday or Tuesday. Cesarean posted for Tuesday at 12:30 for Dr Nehemiah Settle, attending that day, and L&D, OR, and MAU notified. Pt is to come to MAU Sunday between 8a-11a for Covid testing and preop labs, message left for the patient informing her of the new cesarean schedule.

## 2019-09-08 ENCOUNTER — Other Ambulatory Visit (HOSPITAL_COMMUNITY)
Admission: RE | Admit: 2019-09-08 | Discharge: 2019-09-08 | Disposition: A | Discharge status: Home / Self Care | Payer: Medicaid Other | Source: Ambulatory Visit | Attending: Family Medicine | Admitting: Family Medicine

## 2019-09-08 ENCOUNTER — Other Ambulatory Visit: Payer: Self-pay

## 2019-09-08 ENCOUNTER — Encounter (HOSPITAL_COMMUNITY): Payer: Self-pay

## 2019-09-08 LAB — CBC
HCT: 34.1 % — ABNORMAL LOW (ref 36.0–46.0)
Hemoglobin: 10.8 g/dL — ABNORMAL LOW (ref 12.0–15.0)
MCH: 27.1 pg (ref 26.0–34.0)
MCHC: 31.7 g/dL (ref 30.0–36.0)
MCV: 85.7 fL (ref 80.0–100.0)
Platelets: 225 10*3/uL (ref 150–400)
RBC: 3.98 MIL/uL (ref 3.87–5.11)
RDW: 14.4 % (ref 11.5–15.5)
WBC: 8.6 10*3/uL (ref 4.0–10.5)
nRBC: 0 % (ref 0.0–0.2)

## 2019-09-08 LAB — ABO/RH: ABO/RH(D): O POS

## 2019-09-08 LAB — TYPE AND SCREEN
ABO/RH(D): O POS
Antibody Screen: NEGATIVE

## 2019-09-08 LAB — SARS CORONAVIRUS 2 (TAT 6-24 HRS): SARS Coronavirus 2: NEGATIVE

## 2019-09-08 NOTE — Patient Instructions (Signed)
Hannah Ryan  09/08/2019   Your procedure is scheduled on:  09/09/2019  Arrive at 64 at Entrance C on [address] at [facility]  and [address]. You are invited to use the FREE valet parking or use the Visitor's parking deck.  Pick up the phone at the desk and dial [phone].  Call this number if you have problems the morning of surgery: [phone]  Remember:   Do not eat food:(After Midnight) Desps de medianoche.  Do not drink clear liquids: (After Midnight) Desps de medianoche.  Take these medicines the morning of surgery with A SIP OF WATER:  Take labetalol and suboxone as prescribed   Do not wear jewelry, make-up or nail polish.  Do not wear lotions, powders, or perfumes. Do not wear deodorant.  Do not shave 48 hours prior to surgery.  Do not bring valuables to the hospital.  [facility] is not   responsible for any belongings or valuables brought to the hospital.  Contacts, dentures or bridgework may not be worn into surgery.  Leave suitcase in the car. After surgery it may be brought to your room.  For patients admitted to the hospital, checkout time is 11:00 AM the day of              discharge.      Please read over the following fact sheets that you were given:     Preparing for Surgery

## 2019-09-08 NOTE — MAU Note (Signed)
Asymptomatic, swab collected. Lab called 

## 2019-09-09 ENCOUNTER — Inpatient Hospital Stay (HOSPITAL_COMMUNITY): Payer: Medicaid Other | Admitting: Anesthesiology

## 2019-09-09 ENCOUNTER — Encounter (HOSPITAL_COMMUNITY): Payer: Self-pay | Admitting: Anesthesiology

## 2019-09-09 ENCOUNTER — Encounter (HOSPITAL_COMMUNITY)
Admission: RE | Disposition: A | Discharge status: Home / Self Care | Payer: Self-pay | Source: Ambulatory Visit | Attending: Family Medicine

## 2019-09-09 ENCOUNTER — Inpatient Hospital Stay (HOSPITAL_COMMUNITY)
Admission: RE | Admit: 2019-09-09 | Discharge: 2019-09-12 | DRG: 787 | Disposition: A | Discharge status: Home / Self Care | Payer: Medicaid Other | Attending: Family Medicine | Admitting: Family Medicine

## 2019-09-09 DIAGNOSIS — O1002 Pre-existing essential hypertension complicating childbirth: Secondary | ICD-10-CM | POA: Diagnosis present

## 2019-09-09 DIAGNOSIS — Z3A39 39 weeks gestation of pregnancy: Secondary | ICD-10-CM | POA: Diagnosis not present

## 2019-09-09 DIAGNOSIS — Z20828 Contact with and (suspected) exposure to other viral communicable diseases: Secondary | ICD-10-CM | POA: Diagnosis not present

## 2019-09-09 DIAGNOSIS — O99344 Other mental disorders complicating childbirth: Secondary | ICD-10-CM | POA: Diagnosis not present

## 2019-09-09 DIAGNOSIS — O43813 Placental infarction, third trimester: Secondary | ICD-10-CM | POA: Diagnosis not present

## 2019-09-09 DIAGNOSIS — O9902 Anemia complicating childbirth: Secondary | ICD-10-CM | POA: Diagnosis present

## 2019-09-09 DIAGNOSIS — F419 Anxiety disorder, unspecified: Secondary | ICD-10-CM | POA: Diagnosis present

## 2019-09-09 DIAGNOSIS — O99334 Smoking (tobacco) complicating childbirth: Secondary | ICD-10-CM | POA: Diagnosis present

## 2019-09-09 DIAGNOSIS — Z3A38 38 weeks gestation of pregnancy: Secondary | ICD-10-CM

## 2019-09-09 DIAGNOSIS — O99214 Obesity complicating childbirth: Secondary | ICD-10-CM | POA: Diagnosis present

## 2019-09-09 DIAGNOSIS — D649 Anemia, unspecified: Secondary | ICD-10-CM | POA: Diagnosis not present

## 2019-09-09 DIAGNOSIS — O34211 Maternal care for low transverse scar from previous cesarean delivery: Principal | ICD-10-CM | POA: Diagnosis present

## 2019-09-09 DIAGNOSIS — O99324 Drug use complicating childbirth: Secondary | ICD-10-CM | POA: Diagnosis present

## 2019-09-09 DIAGNOSIS — F329 Major depressive disorder, single episode, unspecified: Secondary | ICD-10-CM | POA: Diagnosis present

## 2019-09-09 DIAGNOSIS — F17210 Nicotine dependence, cigarettes, uncomplicated: Secondary | ICD-10-CM | POA: Diagnosis not present

## 2019-09-09 DIAGNOSIS — O43893 Other placental disorders, third trimester: Secondary | ICD-10-CM | POA: Diagnosis not present

## 2019-09-09 DIAGNOSIS — O34219 Maternal care for unspecified type scar from previous cesarean delivery: Secondary | ICD-10-CM | POA: Diagnosis present

## 2019-09-09 DIAGNOSIS — O4103X0 Oligohydramnios, third trimester, not applicable or unspecified: Secondary | ICD-10-CM | POA: Diagnosis not present

## 2019-09-09 DIAGNOSIS — O0990 Supervision of high risk pregnancy, unspecified, unspecified trimester: Secondary | ICD-10-CM

## 2019-09-09 DIAGNOSIS — F1990 Other psychoactive substance use, unspecified, uncomplicated: Secondary | ICD-10-CM | POA: Diagnosis present

## 2019-09-09 LAB — RPR: RPR Ser Ql: NONREACTIVE

## 2019-09-09 SURGERY — Surgical Case
Anesthesia: Spinal | Wound class: Clean Contaminated

## 2019-09-09 MED ORDER — KETOROLAC TROMETHAMINE 30 MG/ML IJ SOLN
30.0000 mg | Freq: Four times a day (QID) | INTRAMUSCULAR | Status: AC | PRN
  Administered 2019-09-09 – 2019-09-10 (×2): 30 mg via INTRAVENOUS
  Filled 2019-09-09: qty 1

## 2019-09-09 MED ORDER — FENTANYL CITRATE (PF) 100 MCG/2ML IJ SOLN
INTRAMUSCULAR | Status: AC
  Filled 2019-09-09: qty 2

## 2019-09-09 MED ORDER — HYDROMORPHONE HCL 1 MG/ML IJ SOLN: 0.2000 mg | INTRAMUSCULAR | Status: DC | PRN

## 2019-09-09 MED ORDER — TETANUS-DIPHTH-ACELL PERTUSSIS 5-2.5-18.5 LF-MCG/0.5 IM SUSP: 0.5000 mL | Freq: Once | INTRAMUSCULAR | Status: DC

## 2019-09-09 MED ORDER — FENTANYL CITRATE (PF) 100 MCG/2ML IJ SOLN
25.0000 ug | INTRAMUSCULAR | Status: DC | PRN
  Administered 2019-09-09: 50 ug via INTRAVENOUS
  Administered 2019-09-09: 25 ug via INTRAVENOUS
  Administered 2019-09-09: 50 ug via INTRAVENOUS

## 2019-09-09 MED ORDER — PHENYLEPHRINE HCL-NACL 20-0.9 MG/250ML-% IV SOLN
INTRAVENOUS | Status: DC | PRN
  Administered 2019-09-09: 60 ug/min via INTRAVENOUS

## 2019-09-09 MED ORDER — LIDOCAINE-EPINEPHRINE (PF) 2 %-1:200000 IJ SOLN
INTRAMUSCULAR | Status: AC
  Filled 2019-09-09: qty 10

## 2019-09-09 MED ORDER — ONDANSETRON HCL 4 MG/2ML IJ SOLN
INTRAMUSCULAR | Status: AC
  Filled 2019-09-09: qty 2

## 2019-09-09 MED ORDER — MORPHINE SULFATE (PF) 0.5 MG/ML IJ SOLN
INTRAMUSCULAR | Status: AC
  Filled 2019-09-09: qty 10

## 2019-09-09 MED ORDER — NALBUPHINE HCL 10 MG/ML IJ SOLN
5.0000 mg | Freq: Once | INTRAMUSCULAR | Status: DC | PRN
  Filled 2019-09-09: qty 0.5

## 2019-09-09 MED ORDER — TRANEXAMIC ACID-NACL 1000-0.7 MG/100ML-% IV SOLN
1000.0000 mg | INTRAVENOUS | Status: AC
  Administered 2019-09-09: 1000 mg via INTRAVENOUS

## 2019-09-09 MED ORDER — IBUPROFEN 800 MG PO TABS
800.0000 mg | ORAL_TABLET | Freq: Four times a day (QID) | ORAL | Status: DC
  Administered 2019-09-11 – 2019-09-12 (×7): 800 mg via ORAL
  Filled 2019-09-09 (×8): qty 1

## 2019-09-09 MED ORDER — OXYTOCIN 40 UNITS IN NORMAL SALINE INFUSION - SIMPLE MED: 2.5000 [IU]/h | INTRAVENOUS | Status: AC

## 2019-09-09 MED ORDER — SIMETHICONE 80 MG PO CHEW
80.0000 mg | CHEWABLE_TABLET | ORAL | Status: DC
  Administered 2019-09-10 – 2019-09-11 (×2): 80 mg via ORAL
  Filled 2019-09-09 (×2): qty 1

## 2019-09-09 MED ORDER — DIPHENHYDRAMINE HCL 25 MG PO CAPS: 25.0000 mg | ORAL_CAPSULE | Freq: Four times a day (QID) | ORAL | Status: DC | PRN

## 2019-09-09 MED ORDER — KETOROLAC TROMETHAMINE 30 MG/ML IJ SOLN
INTRAMUSCULAR | Status: AC
  Filled 2019-09-09: qty 1

## 2019-09-09 MED ORDER — SOD CITRATE-CITRIC ACID 500-334 MG/5ML PO SOLN
ORAL | Status: AC
  Filled 2019-09-09: qty 30

## 2019-09-09 MED ORDER — COCONUT OIL OIL
1.0000 "application " | TOPICAL_OIL | Status: DC | PRN
  Administered 2019-09-11: 1 via TOPICAL

## 2019-09-09 MED ORDER — BUPIVACAINE IN DEXTROSE 0.75-8.25 % IT SOLN
INTRATHECAL | Status: DC | PRN
  Administered 2019-09-09: 1.8 mL via INTRATHECAL

## 2019-09-09 MED ORDER — LABETALOL HCL 200 MG PO TABS
400.0000 mg | ORAL_TABLET | Freq: Two times a day (BID) | ORAL | Status: DC
  Administered 2019-09-09 – 2019-09-12 (×6): 400 mg via ORAL
  Filled 2019-09-09 (×6): qty 2

## 2019-09-09 MED ORDER — BUPIVACAINE IN DEXTROSE 0.75-8.25 % IT SOLN
INTRATHECAL | Status: AC
  Filled 2019-09-09: qty 2

## 2019-09-09 MED ORDER — DEXTROSE 5 % IV SOLN
INTRAVENOUS | Status: AC
  Filled 2019-09-09: qty 3000

## 2019-09-09 MED ORDER — DIPHENHYDRAMINE HCL 50 MG/ML IJ SOLN: 12.5000 mg | INTRAMUSCULAR | Status: DC | PRN

## 2019-09-09 MED ORDER — SENNOSIDES-DOCUSATE SODIUM 8.6-50 MG PO TABS
2.0000 | ORAL_TABLET | ORAL | Status: DC
  Administered 2019-09-10 – 2019-09-11 (×2): 2 via ORAL
  Filled 2019-09-09 (×2): qty 2

## 2019-09-09 MED ORDER — SIMETHICONE 80 MG PO CHEW
80.0000 mg | CHEWABLE_TABLET | Freq: Three times a day (TID) | ORAL | Status: DC
  Administered 2019-09-09 – 2019-09-12 (×9): 80 mg via ORAL
  Filled 2019-09-09 (×8): qty 1

## 2019-09-09 MED ORDER — PHENYLEPHRINE HCL-NACL 20-0.9 MG/250ML-% IV SOLN
INTRAVENOUS | Status: AC
  Filled 2019-09-09: qty 250

## 2019-09-09 MED ORDER — WITCH HAZEL-GLYCERIN EX PADS: 1.0000 "application " | MEDICATED_PAD | CUTANEOUS | Status: DC | PRN

## 2019-09-09 MED ORDER — KETOROLAC TROMETHAMINE 30 MG/ML IJ SOLN: 30.0000 mg | Freq: Four times a day (QID) | INTRAMUSCULAR | Status: AC | PRN

## 2019-09-09 MED ORDER — SODIUM CHLORIDE 0.9 % IV SOLN
INTRAVENOUS | Status: DC | PRN
  Administered 2019-09-09: 13:00:00 via INTRAVENOUS

## 2019-09-09 MED ORDER — ZOLPIDEM TARTRATE 5 MG PO TABS: 5.0000 mg | ORAL_TABLET | Freq: Every evening | ORAL | Status: DC | PRN

## 2019-09-09 MED ORDER — OXYTOCIN 40 UNITS IN NORMAL SALINE INFUSION - SIMPLE MED
INTRAVENOUS | Status: AC
  Filled 2019-09-09: qty 1000

## 2019-09-09 MED ORDER — MORPHINE SULFATE (PF) 0.5 MG/ML IJ SOLN
INTRAMUSCULAR | Status: DC | PRN
  Administered 2019-09-09: .15 mg via INTRATHECAL

## 2019-09-09 MED ORDER — FENTANYL CITRATE (PF) 100 MCG/2ML IJ SOLN
INTRAMUSCULAR | Status: DC | PRN
  Administered 2019-09-09: 35 ug via INTRAVENOUS
  Administered 2019-09-09: 15 ug via INTRATHECAL
  Administered 2019-09-09: 25 ug via INTRAVENOUS

## 2019-09-09 MED ORDER — SODIUM CHLORIDE 0.9% FLUSH: 3.0000 mL | INTRAVENOUS | Status: DC | PRN

## 2019-09-09 MED ORDER — PRENATAL MULTIVITAMIN CH: 1.0000 | ORAL_TABLET | Freq: Every day | ORAL | Status: DC

## 2019-09-09 MED ORDER — SCOPOLAMINE 1 MG/3DAYS TD PT72
MEDICATED_PATCH | TRANSDERMAL | Status: AC
  Filled 2019-09-09: qty 1

## 2019-09-09 MED ORDER — MENTHOL 3 MG MT LOZG: 1.0000 | LOZENGE | OROMUCOSAL | Status: DC | PRN

## 2019-09-09 MED ORDER — MIDAZOLAM HCL 2 MG/2ML IJ SOLN
INTRAMUSCULAR | Status: AC
  Filled 2019-09-09: qty 2

## 2019-09-09 MED ORDER — NALOXONE HCL 0.4 MG/ML IJ SOLN: 0.4000 mg | INTRAMUSCULAR | Status: DC | PRN

## 2019-09-09 MED ORDER — DIPHENHYDRAMINE HCL 25 MG PO CAPS: 25.0000 mg | ORAL_CAPSULE | ORAL | Status: DC | PRN

## 2019-09-09 MED ORDER — ONDANSETRON HCL 4 MG/2ML IJ SOLN
INTRAMUSCULAR | Status: DC | PRN
  Administered 2019-09-09: 4 mg via INTRAVENOUS

## 2019-09-09 MED ORDER — NALBUPHINE HCL 10 MG/ML IJ SOLN
5.0000 mg | INTRAMUSCULAR | Status: DC | PRN
  Filled 2019-09-09: qty 0.5

## 2019-09-09 MED ORDER — LACTATED RINGERS IV SOLN: INTRAVENOUS | Status: DC

## 2019-09-09 MED ORDER — KETOROLAC TROMETHAMINE 30 MG/ML IJ SOLN
30.0000 mg | Freq: Four times a day (QID) | INTRAMUSCULAR | Status: AC
  Administered 2019-09-09 – 2019-09-10 (×3): 30 mg via INTRAVENOUS
  Filled 2019-09-09 (×3): qty 1

## 2019-09-09 MED ORDER — MEPERIDINE HCL 25 MG/ML IJ SOLN: 6.2500 mg | INTRAMUSCULAR | Status: DC | PRN

## 2019-09-09 MED ORDER — ACETAMINOPHEN 500 MG PO TABS
1000.0000 mg | ORAL_TABLET | Freq: Four times a day (QID) | ORAL | Status: DC
  Administered 2019-09-09 – 2019-09-12 (×10): 1000 mg via ORAL
  Filled 2019-09-09 (×12): qty 2

## 2019-09-09 MED ORDER — BUPRENORPHINE HCL-NALOXONE HCL 2-0.5 MG SL SUBL
2.0000 | SUBLINGUAL_TABLET | Freq: Three times a day (TID) | SUBLINGUAL | Status: DC
  Administered 2019-09-09 – 2019-09-12 (×10): 2 via SUBLINGUAL
  Filled 2019-09-09 (×10): qty 2

## 2019-09-09 MED ORDER — ONDANSETRON HCL 4 MG/2ML IJ SOLN: 4.0000 mg | Freq: Three times a day (TID) | INTRAMUSCULAR | Status: DC | PRN

## 2019-09-09 MED ORDER — SODIUM BICARBONATE 8.4 % IV SOLN
INTRAVENOUS | Status: DC | PRN
  Administered 2019-09-09: 5 mL via EPIDURAL

## 2019-09-09 MED ORDER — GABAPENTIN 300 MG PO CAPS
300.0000 mg | ORAL_CAPSULE | ORAL | Status: AC
  Administered 2019-09-09: 300 mg via ORAL

## 2019-09-09 MED ORDER — SCOPOLAMINE 1 MG/3DAYS TD PT72
1.0000 | MEDICATED_PATCH | Freq: Once | TRANSDERMAL | Status: DC
  Administered 2019-09-09: 1.5 mg via TRANSDERMAL

## 2019-09-09 MED ORDER — DEXTROSE 5 % IV SOLN
3.0000 g | INTRAVENOUS | Status: AC
  Administered 2019-09-09: 3 g via INTRAVENOUS

## 2019-09-09 MED ORDER — ACETAMINOPHEN 500 MG PO TABS
1000.0000 mg | ORAL_TABLET | ORAL | Status: AC
  Administered 2019-09-09: 12:00:00 1000 mg via ORAL

## 2019-09-09 MED ORDER — OXYCODONE HCL 5 MG PO TABS
5.0000 mg | ORAL_TABLET | ORAL | Status: DC | PRN
  Administered 2019-09-10: 10 mg via ORAL
  Administered 2019-09-10: 5 mg via ORAL
  Administered 2019-09-11 – 2019-09-12 (×9): 10 mg via ORAL
  Filled 2019-09-09 (×3): qty 2
  Filled 2019-09-09: qty 1
  Filled 2019-09-09 (×7): qty 2

## 2019-09-09 MED ORDER — PRENATAL MULTIVITAMIN CH
1.0000 | ORAL_TABLET | Freq: Every day | ORAL | Status: DC
  Administered 2019-09-11: 1 via ORAL
  Filled 2019-09-09 (×3): qty 1

## 2019-09-09 MED ORDER — OXYTOCIN 40 UNITS IN NORMAL SALINE INFUSION - SIMPLE MED
INTRAVENOUS | Status: DC | PRN
  Administered 2019-09-09: 40 [IU] via INTRAVENOUS

## 2019-09-09 MED ORDER — LACTATED RINGERS IV SOLN
INTRAVENOUS | Status: DC
  Administered 2019-09-09 – 2019-09-10 (×3): via INTRAVENOUS

## 2019-09-09 MED ORDER — SIMETHICONE 80 MG PO CHEW: 80.0000 mg | CHEWABLE_TABLET | ORAL | Status: DC | PRN

## 2019-09-09 MED ORDER — NALOXONE HCL 4 MG/10ML IJ SOLN
1.0000 ug/kg/h | INTRAVENOUS | Status: DC | PRN
  Filled 2019-09-09: qty 5

## 2019-09-09 MED ORDER — SOD CITRATE-CITRIC ACID 500-334 MG/5ML PO SOLN: 30.0000 mL | Freq: Once | ORAL | Status: DC

## 2019-09-09 MED ORDER — TRANEXAMIC ACID-NACL 1000-0.7 MG/100ML-% IV SOLN
INTRAVENOUS | Status: AC
  Filled 2019-09-09: qty 100

## 2019-09-09 MED ORDER — ACETAMINOPHEN 500 MG PO TABS
ORAL_TABLET | ORAL | Status: AC
  Filled 2019-09-09: qty 2

## 2019-09-09 MED ORDER — SOD CITRATE-CITRIC ACID 500-334 MG/5ML PO SOLN
30.0000 mL | ORAL | Status: AC
  Administered 2019-09-09: 30 mL via ORAL

## 2019-09-09 MED ORDER — DIBUCAINE (PERIANAL) 1 % EX OINT: 1.0000 "application " | TOPICAL_OINTMENT | CUTANEOUS | Status: DC | PRN

## 2019-09-09 MED ORDER — MIDAZOLAM HCL 2 MG/2ML IJ SOLN
INTRAMUSCULAR | Status: DC | PRN
  Administered 2019-09-09: 1 mg via INTRAVENOUS

## 2019-09-09 MED ORDER — LACTATED RINGERS IV SOLN
INTRAVENOUS | Status: DC
  Administered 2019-09-09 (×4): via INTRAVENOUS

## 2019-09-09 MED ORDER — ENOXAPARIN SODIUM 100 MG/ML ~~LOC~~ SOLN
90.0000 mg | SUBCUTANEOUS | Status: DC
  Administered 2019-09-10 – 2019-09-12 (×3): 90 mg via SUBCUTANEOUS
  Filled 2019-09-09 (×5): qty 0.9

## 2019-09-09 MED ORDER — GABAPENTIN 300 MG PO CAPS
ORAL_CAPSULE | ORAL | Status: AC
  Filled 2019-09-09: qty 1

## 2019-09-09 MED ORDER — DEXAMETHASONE SODIUM PHOSPHATE 10 MG/ML IJ SOLN
INTRAMUSCULAR | Status: DC | PRN
  Administered 2019-09-09: 10 mg via INTRAVENOUS

## 2019-09-09 SURGICAL SUPPLY — 41 items
BENZOIN TINCTURE PRP APPL 2/3 (GAUZE/BANDAGES/DRESSINGS) ×3 IMPLANT
CHLORAPREP W/TINT 26ML (MISCELLANEOUS) ×3 IMPLANT
CLAMP CORD UMBIL (MISCELLANEOUS) IMPLANT
CLOSURE WOUND 1/2 X4 (GAUZE/BANDAGES/DRESSINGS) ×1
CLOTH BEACON ORANGE TIMEOUT ST (SAFETY) ×3 IMPLANT
DRSG OPSITE POSTOP 4X10 (GAUZE/BANDAGES/DRESSINGS) ×3 IMPLANT
DRSG OPSITE POSTOP 4X12 (GAUZE/BANDAGES/DRESSINGS) ×6 IMPLANT
ELECT REM PT RETURN 9FT ADLT (ELECTROSURGICAL) ×3
ELECTRODE REM PT RTRN 9FT ADLT (ELECTROSURGICAL) ×1 IMPLANT
EXTRACTOR VACUUM M CUP 4 TUBE (SUCTIONS) IMPLANT
EXTRACTOR VACUUM M CUP 4' TUBE (SUCTIONS)
GAUZE SPONGE 4X4 12PLY STRL LF (GAUZE/BANDAGES/DRESSINGS) ×3 IMPLANT
GLOVE BIOGEL PI IND STRL 7.0 (GLOVE) ×2 IMPLANT
GLOVE BIOGEL PI IND STRL 7.5 (GLOVE) ×2 IMPLANT
GLOVE BIOGEL PI INDICATOR 7.0 (GLOVE) ×4
GLOVE BIOGEL PI INDICATOR 7.5 (GLOVE) ×4
GLOVE ECLIPSE 7.5 STRL STRAW (GLOVE) ×3 IMPLANT
GOWN STRL REUS W/TWL LRG LVL3 (GOWN DISPOSABLE) ×9 IMPLANT
HOVERMATT SINGLE USE (MISCELLANEOUS) ×3 IMPLANT
KIT ABG SYR 3ML LUER SLIP (SYRINGE) IMPLANT
NEEDLE HYPO 25X5/8 SAFETYGLIDE (NEEDLE) IMPLANT
NS IRRIG 1000ML POUR BTL (IV SOLUTION) ×3 IMPLANT
PACK C SECTION WH (CUSTOM PROCEDURE TRAY) ×3 IMPLANT
PAD ABD 7.5X8 STRL (GAUZE/BANDAGES/DRESSINGS) ×3 IMPLANT
PAD OB MATERNITY 4.3X12.25 (PERSONAL CARE ITEMS) ×3 IMPLANT
PENCIL SMOKE EVAC W/HOLSTER (ELECTROSURGICAL) ×3 IMPLANT
RETAINER VISCERAL (MISCELLANEOUS) ×3 IMPLANT
RETRACTOR TRAXI PANNICULUS (MISCELLANEOUS) ×1 IMPLANT
RTRCTR C-SECT PINK 25CM LRG (MISCELLANEOUS) ×3 IMPLANT
SPONGE LAP 18X18 RF (DISPOSABLE) ×6 IMPLANT
STRIP CLOSURE SKIN 1/2X4 (GAUZE/BANDAGES/DRESSINGS) ×2 IMPLANT
SUT PDS AB 0 CTX 60 (SUTURE) ×3 IMPLANT
SUT VIC AB 0 CTX 36 (SUTURE) ×6
SUT VIC AB 0 CTX36XBRD ANBCTRL (SUTURE) ×3 IMPLANT
SUT VIC AB 2-0 CT1 27 (SUTURE) ×2
SUT VIC AB 2-0 CT1 TAPERPNT 27 (SUTURE) ×1 IMPLANT
SUT VIC AB 4-0 KS 27 (SUTURE) ×3 IMPLANT
TOWEL OR 17X24 6PK STRL BLUE (TOWEL DISPOSABLE) ×3 IMPLANT
TRAXI PANNICULUS RETRACTOR (MISCELLANEOUS) ×2
TRAY FOLEY W/BAG SLVR 14FR LF (SET/KITS/TRAYS/PACK) ×3 IMPLANT
WATER STERILE IRR 1000ML POUR (IV SOLUTION) ×3 IMPLANT

## 2019-09-09 NOTE — Interval H&P Note (Signed)
History and Physical Interval Note:  09/09/2019 10:59 AM  Hannah Ryan  has presented today for surgery, with the diagnosis of repeat c-section 38 1/2 weeks oligo.  The various methods of treatment have been discussed with the patient and family. After consideration of risks, benefits and other options for treatment, the patient has consented to  Procedure(s): CESAREAN SECTION (N/A) as a surgical intervention.  The patient's history has been reviewed, patient examined, no change in status, stable for surgery.  I have reviewed the patient's chart and labs.  Questions were answered to the patient's satisfaction.    The risks of cesarean section discussed with the patient included but were not limited to: bleeding which may require transfusion or reoperation; infection which may require antibiotics; injury to bowel, bladder, ureters or other surrounding organs; injury to the fetus; need for additional procedures including hysterectomy in the event of a life-threatening hemorrhage; placental abnormalities wth subsequent pregnancies, incisional problems, thromboembolic phenomenon and other postoperative/anesthesia complications. The patient concurred with the proposed plan, giving informed written consent for the procedure.   Patient has been NPO since last night and she will remain NPO for procedure. Anesthesia and OR aware.  Preoperative prophylactic Ancef ordered on call to the OR.  To OR when ready.  Truett Mainland, DO 09/09/2019 11:00 AM    Truett Mainland

## 2019-09-09 NOTE — Op Note (Signed)
Operative Note   SURGERY DATE: 09/09/2019  PRE-OP DIAGNOSIS:  *Pregnancy at 38 weeks *History of Cesarean delivery  POST-OP DIAGNOSIS:  *Pregnancy at 38 weeks *Cesrean delivery delivered   PROCEDURE: repeat low transverse cesarean section via pfannenstiel skin incision with double layer uterine closure  SURGEON: Surgeon(s) and Role:    * Stinson, Rhona Raider, DO - Primary    * Jorah Hua L, DO - Fellow  ASSISTANT: None  ANESTHESIA: spinal  ESTIMATED BLOOD LOSS: 676 mL  DRAINS: 100 mL UOP via indwelling foley  TOTAL IV FLUIDS: 2600 mL crystalloid  VTE PROPHYLAXIS: SCDs to bilateral lower extremities  ANTIBIOTICS: Three grams of Cefazolin were given., within 1 hour of skin incision  MEDICATIONS ADMINISTERED: 1 g TXA, prior to surgery  SPECIMENS: Placenta  COMPLICATIONS: Vacuum extraction (Mity Vac)  INDICATIONS: Elective repeat  FINDINGS: Thick scar tissue through the subcutaneous fascia. No intra-abdominal adhesions were noted. Grossly normal uterus, tubes and ovaries. Clear amniotic fluid, cephalic female infant, weight per medical record, APGARs 9/9, intact placenta.  PROCEDURE IN DETAIL: The patient was taken to the operating room where anesthesia was administered and normal fetal heart tones were confirmed. She was then prepped and draped in the normal fashion in the dorsal supine position with a leftward tilt.  After a time out was performed, a pfannensteil skin incision was made with the scalpel and carried through to the underlying layer of fascia. The fascia was then incised at the midline and this incision was extended laterally with the mayo scissors. Attention was turned to the superior aspect of the fascial incision which was grasped with the kocher clamps x 2, tented up and the rectus muscles were dissected off bluntly and sharply. In a similar fashion the inferior aspect of the fascial incision was grasped with the kocher clamps, tented up and the rectus  muscles dissected off bluntly. The rectus muscles were then separated in the midline and the peritoneum was entered bluntly. The Alexis retractor was inserted and the vesicouterine peritoneum was identified.  A low transverse hysterotomy was made with the scalpel until the endometrial cavity was breached and the amniotic sac ruptured with the Allis clamp, yielding clear amniotic fluid. This incision was extended bluntly and due to difficulty delivering the infant's head through the hysterotomy, the Mity Vac was used to assist delivery of the head. The shoulders and body were subsequently delivered atraumatically. The cord was clamped x 2 and cut, and the infant was handed to the awaiting pediatricians, after delayed cord clamping was done.  The placenta was then gradually expressed from the uterus and then the uterus was cleared of all clots and debris. The hysterotomy was repaired with a running suture of 0 Vicryl. A second imbricating layer of 0 Vicryl suture was then placed, followed by a third layer of 0 Vicryl to close the serosa to achieve excellent hemostasis.   The hysterotomy and all operative sites were reinspected and excellent hemostasis was noted.  The peritoneum was closed with a running stitch of 2-0 Vicryl. The fascia was reapproximated with 0 PDS in a simple running fashion bilaterally. The subcutaneous layer was then reapproximated with running sutures of 2-0 plain gut, and the skin was then closed with 4-0 Vicryl, in a subcuticular fashion.  The patient  tolerated the procedure well. Sponge, lap, needle, and instrument counts were correct x 2. The patient was transferred to the recovery room awake, alert and breathing independently in stable condition.  Marlowe Alt, DO OB Fellow  [facility] for [employer] [facility]).

## 2019-09-09 NOTE — Anesthesia Procedure Notes (Signed)
Spinal  Patient location during procedure: OR Start time: 09/09/2019 12:25 PM End time: 09/09/2019 12:32 PM Staffing Anesthesiologist: Josephine Igo, MD Performed: anesthesiologist  Preanesthetic Checklist Completed: patient identified, site marked, surgical consent, pre-op evaluation, timeout performed, IV checked, risks and benefits discussed and monitors and equipment checked Spinal Block Patient position: sitting Prep: site prepped and draped and DuraPrep Patient monitoring: heart rate, cardiac monitor, continuous pulse ox and blood pressure Approach: midline Location: L3-4 Injection technique: single-shot Needle Needle type: Spinocan and Tuohy  Needle gauge: 24 G Needle length: 12.7 cm Needle insertion depth: 9 cm Catheter type: closed end flexible Catheter size: 19 g Catheter at skin depth: 14 cm Assessment Sensory level: T4 Additional Notes Epidural performed using LOR with air technique. No CSF, Heme or paresthesias. SAB performed through the epidural needle using 24ga Spinocan needle. CSF clear with free flow and no paresthesias. Local anesthetic and narcotics injected through the spinal needle and withdrawn. Epidural catheter threaded 5cm into the epidural space and the epidural needle was withdrawn. A sterile dressing was applied and the patient placed supine with LUD. The patient tolerated the procedure well and adequate sensory level was obtained.

## 2019-09-09 NOTE — Transfer of Care (Signed)
Immediate Anesthesia Transfer of Care Note  Patient: Hannah Ryan  Procedure(s) Performed: CESAREAN SECTION (N/A )  Patient Location: PACU  Anesthesia Type:Spinal  Level of Consciousness: awake  Airway & Oxygen Therapy: Patient Spontanous Breathing  Post-op Assessment: Report given to RN  Post vital signs: Reviewed and stable  Last Vitals:  Vitals Value Taken Time  BP 112/61 09/09/19 1426  Temp 36.7 C 09/09/19 1428  Pulse 71 09/09/19 1429  Resp 17 09/09/19 1429  SpO2 100 % 09/09/19 1429  Vitals shown include unvalidated device data.  Last Pain:  Vitals:   09/09/19 1428  TempSrc: Oral  PainSc:       Patients Stated Pain Goal: 4 (91/63/84 6659)  Complications: No apparent anesthesia complications

## 2019-09-09 NOTE — Anesthesia Preprocedure Evaluation (Signed)
Anesthesia Evaluation  Patient identified by MRN, date of birth, ID band Patient awake    Reviewed: Allergy & Precautions, NPO status , Patient's Chart, lab work & pertinent test results, reviewed documented beta blocker date and time   Airway Mallampati: III  TM Distance: >3 FB Neck ROM: Full    Dental no notable dental hx. (+) Teeth Intact   Pulmonary Current Smoker,    Pulmonary exam normal breath sounds clear to auscultation       Cardiovascular hypertension, Pt. on home beta blockers and Pt. on medications Normal cardiovascular exam Rhythm:Regular Rate:Normal     Neuro/Psych PSYCHIATRIC DISORDERS Anxiety Depression negative neurological ROS     GI/Hepatic Neg liver ROS, GERD  ,  Endo/Other  Morbid obesity  Renal/GU negative Renal ROS  negative genitourinary   Musculoskeletal   Abdominal (+) + obese,   Peds  Hematology  (+) anemia ,   Anesthesia Other Findings   Reproductive/Obstetrics (+) Pregnancy Pre eclampsia with this pregnancy                             Anesthesia Physical Anesthesia Plan  ASA: III  Anesthesia Plan: Spinal and Combined Spinal and Epidural   Post-op Pain Management:    Induction:   PONV Risk Score and Plan: 3 and Ondansetron, Scopolamine patch - Pre-op and Treatment may vary due to age or medical condition  Airway Management Planned: Natural Airway  Additional Equipment:   Intra-op Plan:   Post-operative Plan:   Informed Consent: I have reviewed the patients History and Physical, chart, labs and discussed the procedure including the risks, benefits and alternatives for the proposed anesthesia with the patient or authorized representative who has indicated his/her understanding and acceptance.     Dental advisory given  Plan Discussed with: CRNA and Surgeon  Anesthesia Plan Comments:         Anesthesia Quick Evaluation

## 2019-09-09 NOTE — Discharge Summary (Signed)
Postpartum Discharge Summary  Date of Service updated 09/12/2019     Patient Name: Hannah Ryan DOB: June 08, 1991 MRN: [id number]  Date of admission: 09/09/2019 Delivering Provider: Truett Mainland   Date of discharge: 09/12/2019  Admitting diagnosis: repeat csection 38.5 weeks oligo Intrauterine pregnancy: [redacted]w[redacted]d    Secondary diagnosis:  Active Problems:   Previous cesarean section complicating pregnancy, antepartum condition or complication   Cesarean delivery delivered  Additional problems: Polysubstance use, Suboxone use     Discharge diagnosis: Term Pregnancy Delivered                                                                                                Post partum procedures:None  Augmentation: None  Complications: None  Hospital course:  Sceduled C/S   28y.o. yo G2P1001 at 3110w4das admitted to the hospital 09/09/2019 for scheduled cesarean section with the following indication:Elective Repeat.  Membrane Rupture Time/Date: 1:11 PM ,09/09/2019   Patient delivered a Viable infant.09/09/2019  Details of operation can be found in separate operative note.  Pateint had an uncomplicated postpartum course.  She is ambulating, tolerating a regular diet, passing flatus, and urinating well. Patient is discharged home in stable condition on  09/12/19. Patient was started on Zoloft 25 mg prior to discharge for depression/anxiety. Would recommend increasing dose to 50 mg at follow up appointment.         Delivery time: 1:13 PM    Magnesium Sulfate received: No BMZ received: No Rhophylac:N/A MMR:N/A Transfusion:No  Physical exam  Vitals:   09/11/19 2129 09/11/19 2217 09/12/19 0528 09/12/19 1030  BP: 136/70 (!) 123/52 (!) 113/52 124/64  Pulse:  76 72 75  Resp:   18   Temp: 98.4 F (36.9 C)  98.4 F (36.9 C)   TempSrc: Oral  Oral   SpO2: 100%  100%   Weight:      Height:       General: alert, cooperative and no distress Lochia: appropriate Uterine  Fundus: firm Incision: Healing well with no significant drainage, No significant erythema, Dressing is clean, dry, and intact DVT Evaluation: No evidence of DVT seen on physical exam. Negative Homan's sign. No cords or calf tenderness. No significant calf/ankle edema. Labs: Lab Results  Component Value Date   WBC 14.4 (H) 09/10/2019   HGB 9.1 (L) 09/10/2019   HCT 28.7 (L) 09/10/2019   MCV 85.7 09/10/2019   PLT 199 09/10/2019   CMP Latest Ref Rng & Units 09/10/2019  Glucose 70 - 99 mg/dL -  BUN 6 - 20 mg/dL -  Creatinine 0.44 - 1.00 mg/dL 0.65  Sodium 135 - 145 mmol/L -  Potassium 3.5 - 5.1 mmol/L -  Chloride 98 - 111 mmol/L -  CO2 22 - 32 mmol/L -  Calcium 8.9 - 10.3 mg/dL -  Total Protein 6.5 - 8.1 g/dL -  Total Bilirubin 0.3 - 1.2 mg/dL -  Alkaline Phos 38 - 126 U/L -  AST 15 - 41 U/L -  ALT 0 - 44 U/L -    Discharge instruction: per After Visit Summary and "Baby and  Me Booklet".  After visit meds:  Allergies as of 09/12/2019   No Known Allergies     Medication List    STOP taking these medications   Buprenorphine HCl-Naloxone HCl 4-1 MG Film Commonly known as: Suboxone   labetalol 200 MG tablet Commonly known as: NORMODYNE     TAKE these medications   acetaminophen 500 MG tablet Commonly known as: TYLENOL Take 1,000 mg by mouth every 6 (six) hours as needed for moderate pain or headache.   amLODipine 10 MG tablet Commonly known as: NORVASC Take 1 tablet (10 mg total) by mouth daily.   aspirin 81 MG tablet Take 2 tablets (162 mg total) by mouth daily. What changed:   how much to take  when to take this   Blood Pressure Monitor Automat Devi Take BP at home daily.  Alert [place] if >140/90 more than once.   ibuprofen 800 MG tablet Commonly known as: ADVIL Take 1 tablet (800 mg total) by mouth every 8 (eight) hours as needed.   oxyCODONE 5 MG immediate release tablet Commonly known as: Roxicodone Take 1 tablet (5 mg total) by mouth every 4 (four)  hours as needed for up to 7 days.   PrePLUS 27-1 MG Tabs Take 1 tablet by mouth daily.       Diet: routine diet  Activity: Advance as tolerated. Pelvic rest for 6 weeks.   Outpatient follow up:4 weeks Follow up Appt: Future Appointments  Date Time Provider [place]  09/23/2019  2:15 PM [place]  09/23/2019  3:00 PM [place]  10/07/2019  1:15 PM Nehemiah Settle, Tanna Savoy, DO [facility]   Follow up Visit: Please schedule this patient for Postpartum visit in: 4 weeks with the following provider: MD For C/S patients schedule nurse incision check in weeks 2 weeks: yes High risk pregnancy complicated by: cHTN, polysubstance, suboxone use Delivery mode:  CS Anticipated Birth Control:  other/unsure PP Procedures needed: Incision check  Schedule Integrated BH visit: yes   Newborn Data: Live born female  Birth Weight:  2910g APGAR: 9, 9   Newborn Delivery   Birth date/time: 09/09/2019 13:13:00 Delivery type: C-Section, Vacuum Assisted Trial of labor: No C-section categorization: Repeat      Baby Feeding: Breast Disposition:rooming in   I confirm that I have verified the information documented in the resident's note and that I have also personally reperformed the history, physical exam and all medical decision making activities of this service and have verified that all service and findings are accurately documented in this student's note.   DW Dr. Kennon Rounds she will put in orders for Suboxone to be sent to the [facility].   Marcille Buffy DNP, CNM  09/12/19  12:44 PM

## 2019-09-09 NOTE — Anesthesia Postprocedure Evaluation (Signed)
Anesthesia Post Note  Patient: Hannah Ryan  Procedure(s) Performed: CESAREAN SECTION (N/A )     Patient location during evaluation: PACU Anesthesia Type: Spinal Level of consciousness: oriented and awake and alert Pain management: pain level controlled Vital Signs Assessment: post-procedure vital signs reviewed and stable Respiratory status: spontaneous breathing, respiratory function stable and nonlabored ventilation Cardiovascular status: blood pressure returned to baseline and stable Postop Assessment: no headache, no backache, no apparent nausea or vomiting, spinal receding and patient able to bend at knees Anesthetic complications: no    Last Vitals:  Vitals:   09/09/19 1545 09/09/19 1600  BP: 100/79   Pulse:    Resp: 16   Temp:  (!) 36.4 C  SpO2: 100%     Last Pain:  Vitals:   09/09/19 1600  TempSrc: Oral  PainSc:    Pain Goal: Patients Stated Pain Goal: 4 (09/09/19 1144)  LLE Motor Response: Purposeful movement (09/09/19 1545) LLE Sensation: Tingling (09/09/19 1545) RLE Motor Response: Purposeful movement (09/09/19 1545) RLE Sensation: Tingling (09/09/19 1545)     Epidural/Spinal Function Cutaneous sensation: Tingles (09/09/19 1545), Patient able to flex knees: Yes (09/09/19 1545), Patient able to lift hips off bed: No (09/09/19 1545), Back pain beyond tenderness at insertion site: No (09/09/19 1545), Progressively worsening motor and/or sensory loss: No (09/09/19 1545), Bowel and/or bladder incontinence post epidural: No (09/09/19 1545)  Zyquan Crotty A.

## 2019-09-10 ENCOUNTER — Other Ambulatory Visit (HOSPITAL_COMMUNITY)
Admission: RE | Admit: 2019-09-10 | Discharge: 2019-09-10 | Disposition: A | Discharge status: Home / Self Care | Payer: Medicaid Other | Source: Ambulatory Visit | Attending: Family Medicine | Admitting: Family Medicine

## 2019-09-10 ENCOUNTER — Encounter (HOSPITAL_COMMUNITY): Payer: Self-pay | Admitting: *Deleted

## 2019-09-10 LAB — CREATININE, SERUM
Creatinine, Ser: 0.65 mg/dL (ref 0.44–1.00)
GFR calc Af Amer: >60 mL/min (ref >60)
GFR calc non Af Amer: >60 mL/min (ref >60)

## 2019-09-10 LAB — CBC
HCT: 28.7 % — ABNORMAL LOW (ref 36.0–46.0)
Hemoglobin: 9.1 g/dL — ABNORMAL LOW (ref 12.0–15.0)
MCH: 27.2 pg (ref 26.0–34.0)
MCHC: 31.7 g/dL (ref 30.0–36.0)
MCV: 85.7 fL (ref 80.0–100.0)
Platelets: 199 10*3/uL (ref 150–400)
RBC: 3.35 MIL/uL — ABNORMAL LOW (ref 3.87–5.11)
RDW: 14.5 % (ref 11.5–15.5)
WBC: 14.4 10*3/uL — ABNORMAL HIGH (ref 4.0–10.5)
nRBC: 0 % (ref 0.0–0.2)

## 2019-09-10 MED ORDER — SERTRALINE HCL 25 MG PO TABS
25.0000 mg | ORAL_TABLET | Freq: Every day | ORAL | Status: DC
  Administered 2019-09-10 – 2019-09-12 (×3): 25 mg via ORAL
  Filled 2019-09-10 (×3): qty 1

## 2019-09-10 NOTE — Lactation Note (Addendum)
This note was copied from a baby's chart. Lactation Consultation Note  Patient Name: Hannah Ryan YJEHU'D Date: 09/10/2019 Reason for consult: Initial assessment;1st time breastfeeding;Early term 37-38.6wks P2, 15 hour female,  ETI and ESC infant . Mom with hx: C/S delivery, CHTN and drug use  Mom is currently on suboxene. Infant had two voids and two stools since birth. Per mom, this is her first time breastfeeding and she really wants to breastfeed  Mom receives [facility] in [place]. [place] referral sent to [facility] entered room infant was cuing to breastfeed, mom latched infant on right breast using the football hold position. Infant latched with wide mouth, LC asked mom  to support breast using a "U" hold hand position, mom has very large breast but small nipples, infant latched without difficulty and was still breastfeeding after 14 minutes when Speculator left room. Mom was pleased with latch, states she felt a tug and not pain. Mom knows after latching infant at breast to use DEBP and pump both breast on initial setting for 15 minutes. Mom will use DEBP every 3 hours. Mom shown how to use DEBP & how to disassemble, clean, & reassemble parts. Mom knows to call Nurse or [place] if she has questions, concerns or need assistance with latching infant to breast. Mom will breastfeed according to hunger cues, 8 to 12 times within 24 hours and on demand. Reviewed Baby & Me book's Breastfeeding Basics.  Mom made aware of O/P services, breastfeeding support groups, community resources, and our phone # for post-discharge questions.   Maternal Data Formula Feeding for Exclusion: Yes Reason for exclusion: Mother's choice to formula and breast feed on admission Has patient been taught Hand Expression?: Yes Does the patient have breastfeeding experience prior to this delivery?: No  Feeding Feeding Type: Breast Fed  LATCH Score Latch: Grasps breast easily, tongue down, lips flanged,  rhythmical sucking.  Audible Swallowing: A few with stimulation  Type of Nipple: Everted at rest and after stimulation  Comfort (Breast/Nipple): Soft / non-tender  Hold (Positioning): Assistance needed to correctly position infant at breast and maintain latch.  LATCH Score: 8  Interventions Interventions: Breast feeding basics reviewed;Breast compression;Adjust position;Assisted with latch;Support pillows;Skin to skin;DEBP;Position options;Breast massage;Expressed milk;Hand express  Lactation Tools Discussed/Used Tools: Pump Breast pump type: Double-Electric Breast Pump WIC Program: Yes Pump Review: Setup, frequency, and cleaning;Milk Storage Initiated by:: Vicente Serene, IBCLC Date initiated:: 09/10/19   Consult Status Consult Status: Follow-up Date: 09/10/19 Follow-up type: In-patient    Vicente Serene 09/10/2019, 2:42 AM
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
## 2019-09-10 NOTE — Progress Notes (Signed)
POSTPARTUM PROGRESS NOTE  Subjective: Anndrea Mihelich is a 28 y.o. G2P2002 s/p POD#1 LTCS at [redacted]w[redacted]d.  She reports she doing well. No acute events overnight. She has not been up to ambulate yet, continues to have a foley catheter and making good urine. She is taking in some p.o.  Denies nausea or vomiting. She has passed flatus. Pain is well controlled.  Lochia is decreasing, has gone through no clots.  Objective: Blood pressure 121/62, pulse 61, temperature 98.2 F (36.8 C), temperature source Oral, resp. rate 16, height 5\' 8"  (1.727 m), weight (!) 183.3 kg, last menstrual period 12/13/2018, SpO2 100 %, unknown if currently breastfeeding.  Physical Exam:  General: alert, cooperative and no distress Chest: no respiratory distress Abdomen: soft, mild tenderness at incision site, dressing dry and intact. Uterine Fundus: firm, appropriately tender Extremities: No calf swelling or tenderness  no edema  Recent Labs    09/08/19 1422 09/10/19 0608  HGB 10.8* 9.1*  HCT 34.1* 28.7*    Assessment/Plan: Chrishelle Zito is a 28 y.o. G2P2002 s/p POD #1LTCS at 110w4d for .  Routine Postpartum Care: Doing well, pain well-controlled.  -- Continue routine care, lactation support  -- Feeding: breastfeeding  Dispo: Plan for discharge 24-48 hrs. Carollee Leitz MD [place] for [facility]

## 2019-09-11 LAB — SURGICAL PATHOLOGY

## 2019-09-11 NOTE — Progress Notes (Signed)
Pt called out and was in the restroom and states that her dressing has a lot of blood on it and was worried her incision "ripped open." She was also worried about her lochia and that she passed a small clot. I assessed her lochia which was light and a very small clot noted in toilet. I got her to lay in bed and assessed her fundus which was firm at U. I called the on call physician and asked if I could change patient's dressing and MD went in to assess it and got order to change it. I changed dressing pt tolerated fairly well. Pt is now in bed and says she is more comfortable and feels more calm and reassured when we told her its normal for dressings to have some blood on them.

## 2019-09-11 NOTE — Lactation Note (Signed)
This note was copied from a baby's chart. Lactation Consultation Note  Patient Name: Hannah Ryan [id number] Date: 09/11/2019 Reason for consult: Follow-up assessment;1st time breastfeeding;Early term 37-38.6wks  [id number] F/U visit with P2 mom who delivered @ 38.4wks, baby is now 74 hours old with 6% wt loss. This is mom's first time breastfeeding. Mom with Hx of substance abuse and currently on suboxone; monitoring infant for withdrawal.  LC entered room to find mom in bed with baby at her feet, dressing him and baby crying. Mom states baby has been on her breast for the last 2 hours until physician came in for examination. Mom states she is very tired and is very overwhelmed with infant's crying and desire to breastfeed constantly.  Reassured mom that she is doing a good job and the fussiness and excessive suckling could be a symptom of withdrawal. Encouraged mom to continue to offer the breast as desired by infant and perform STS as much as possible. Informed mom that her breastmilk will help with the baby's withdrawal instead of an abrupt cessation of the medications she has been using.  Reviewed feeding 8-12 times in 24 hours and with feeding cues. Mom states baby feeds a lot more than that. Mom with large pendulous breasts and wide oval shaped nipples. Encouraged mom to strive for deep latch and mom states she can feel when infant not latched deeply enough.  Offered to assist with latch and mom consents. Encouraged mom to perform hand expression prior to latching. Reviewed positioning her fingers on her breasts and compressing back against her chest. Colostrum streaked from left breast after one compression. Infant positioned on left breast on pillows to bring baby to the level of mom's breast. Demonstrated how to sandwich her breast tissue and "rock" the baby onto the nipple to achieve deep latch. Rolled blankets placed under mom's wrist for support. Reviewed basic latch re: nose/chin  touching the breast during the feeding, lips flanged, wide angle of the mouth.  Reviewed IP lactation services and encouraged to call with any concerns.  Praised mom for her efforts and attempted to encourage mom. Baby still suckling upon [place] exit.  Maternal Data Has patient been taught Hand Expression?: Yes Does the patient have breastfeeding experience prior to this delivery?: No  Feeding Feeding Type: Breast Fed  LATCH Score Latch: Grasps breast easily, tongue down, lips flanged, rhythmical sucking.  Audible Swallowing: Spontaneous and intermittent  Type of Nipple: Everted at rest and after stimulation  Comfort (Breast/Nipple): Soft / non-tender  Hold (Positioning): Assistance needed to correctly position infant at breast and maintain latch.  LATCH Score: 9  Interventions Interventions: Breast feeding basics reviewed;Assisted with latch;Skin to skin;Hand express;Position options;Support pillows;Adjust position  Lactation Tools Discussed/Used Pump Review: Setup, frequency, and cleaning   Consult Status Consult Status: Follow-up Date: 09/12/19 Follow-up type: In-patient    Cranston Neighbor 09/11/2019, 4:02 PM

## 2019-09-11 NOTE — Progress Notes (Signed)
CSW aware infant's UDS results came back positive for THC. CSW has called and updated [facility] CPS of UDS results. CSW informed report has been assigned to [employer]. CSW to follow up with [place] regarding disposition. CSW spoke with MOB at bedside who reported an increase in anxiety due to not having slept for 2 days. CSW updated MOB of the above information and stated she would come back and check on MOB later so that she could rest. CSW will continue to follow.  Hannah Ryan, [place]  Women's and [employer] [phone]

## 2019-09-11 NOTE — Progress Notes (Signed)
POSTPARTUM PROGRESS NOTE  Subjective: Krystall Kruckenberg is a 28 y.o. G2P2002 s/p POD#2 LTCS at [redacted]w[redacted]d.  She reports not doing well as she has not been sleeping, only ab hour last night.  She endorses that she feels like her anxiety level is rising due to lack of sleep. She feels that her Suboxone is helping to keep her cravings down and states that the Case Worker has given her information to receive the medicatoin when discharged. Appetite ok. Denies any nausea or vomiting.She reports having some dizziness last night when standing. She has voided and has passed flatus.  Pain is well controlled.  Lochia had increased overnight with clot but she now reports it has since decreased.  Objective: Blood pressure 130/62, pulse 66, temperature 97.6 F (36.4 C), temperature source Axillary, resp. rate 18, height 5\' 8"  (1.727 m), weight (!) 183.3 kg, last menstrual period 12/13/2018, SpO2 100 %, unknown if currently breastfeeding.  Physical Exam:  General: alert, cooperative and no distress Chest: RRR, no murmurs appreciated, lungs clear to auscultation bilaterally, no increased work of breathing Abdomen: soft, mild tenderness at surgical site  Uterine Fundus: firm, appropriately tender Extremities: No calf swelling or tenderness  no edema  Recent Labs    09/08/19 1422 09/10/19 0608  HGB 10.8* 9.1*  HCT 34.1* 28.7*    Assessment/Plan: Raney Antwine is a 28 y.o. G2P2002 s/p POD#2 LTCS at [redacted]w[redacted]d  .  Routine Postpartum Care: Doing well, pain well-controlled.  -- Continue routine care, lactation support  -- Feeding: Breastfeeding  Dispo: Plan for discharge tomorrow if patient medically stable.  Carollee Leitz MD [place] for [facility]

## 2019-09-12 ENCOUNTER — Inpatient Hospital Stay (HOSPITAL_COMMUNITY): Admit: 2019-09-12 | Payer: Medicaid Other | Admitting: Family Medicine

## 2019-09-12 ENCOUNTER — Encounter (HOSPITAL_COMMUNITY): Payer: Self-pay

## 2019-09-12 SURGERY — Surgical Case
Anesthesia: Regional

## 2019-09-12 MED ORDER — IBUPROFEN 800 MG PO TABS: 800.0000 mg | ORAL_TABLET | Freq: Three times a day (TID) | ORAL | 0 refills | Status: AC | PRN

## 2019-09-12 MED ORDER — BUPRENORPHINE HCL-NALOXONE HCL 8-2 MG SL FILM: 1.0000 | ORAL_FILM | Freq: Three times a day (TID) | SUBLINGUAL | 0 refills | Status: DC

## 2019-09-12 MED ORDER — FERROUS SULFATE 325 (65 FE) MG PO TABS
325.0000 mg | ORAL_TABLET | Freq: Every day | ORAL | Status: DC
  Administered 2019-09-12: 325 mg via ORAL
  Filled 2019-09-12: qty 1

## 2019-09-12 MED ORDER — OXYCODONE HCL 5 MG PO TABS: 5.0000 mg | ORAL_TABLET | ORAL | 0 refills | Status: AC | PRN

## 2019-09-12 MED ORDER — PNEUMOCOCCAL 13-VAL CONJ VACC IM SUSP: 0.5000 mL | INTRAMUSCULAR | Status: DC

## 2019-09-12 MED ORDER — AMLODIPINE BESYLATE 10 MG PO TABS: 10.0000 mg | ORAL_TABLET | Freq: Every day | ORAL | 11 refills | Status: AC

## 2019-09-12 MED ORDER — FERROUS SULFATE 325 (65 FE) MG PO TABS: 325.0000 mg | ORAL_TABLET | Freq: Every day | ORAL | 0 refills | Status: AC

## 2019-09-12 MED ORDER — PNEUMOCOCCAL 13-VAL CONJ VACC IM SUSP
0.5000 mL | Freq: Once | INTRAMUSCULAR | Status: AC
  Administered 2019-09-12: 0.5 mL via INTRAMUSCULAR
  Filled 2019-09-12: qty 0.5

## 2019-09-12 MED FILL — FERROUS SULFATE 325 MG TAB: 325 (65 FE) | 30 days supply | Qty: 30 | Fill #0

## 2019-09-12 MED FILL — oxyCODONE HCL 5 MG TABS: 5 | 7 days supply | Qty: 20 | Fill #0

## 2019-09-12 MED FILL — IBUPROFEN 800 MG TAB: 800 | 10 days supply | Qty: 30 | Fill #0

## 2019-09-12 MED FILL — AMLODIPINE BESYLATE 10 MG T: 10 | 30 days supply | Qty: 30 | Fill #0

## 2019-09-12 NOTE — Progress Notes (Signed)
CSW informed by [facility] CPS that report has been assigned to [employer] as a 72 hour response from day report was made (11/18). CSW spoke with Satira Mccallum who stated she would be out to meet with patient tomorrow (11/21). CSW still waiting disposition from CPS. CSW continues to follow.  Hannah Ryan, [place]  Women's and [employer] [phone]

## 2019-09-12 NOTE — Lactation Note (Signed)
This note was copied from a baby's chart. Lactation Consultation Note  Patient Name: Hannah Ryan [id number] Date: 09/12/2019 Reason for consult: Follow-up assessment;Early term 37-38.6wks;Other (Comment);1st time breastfeeding(eat sleep and console) Baby is 72 hours old/7% weight loss.  Mom is breastfeeding often and also gave formula x2 this morning due to hunger.  Breasts are full and mom has not pumped.  Assisted with symphony pump.  Baby continues to act very hungry.  Mom pumped for 15 minutes and obtained 10 mls.  Baby eagerly took expressed milk by bottle.  Mom then put baby back to breast for 10 more minutes and baby calmed and relaxed.  Instructed to post pump every 3 hours and give expressed milk to baby.  Manual pump also given for prn use.  Reviewed outpatient services and encouraged to call prn.  Maternal Data    Feeding Feeding Type: Breast Fed Nipple Type: Slow - flow  LATCH Score Latch: Grasps breast easily, tongue down, lips flanged, rhythmical sucking.  Audible Swallowing: A few with stimulation  Type of Nipple: Everted at rest and after stimulation  Comfort (Breast/Nipple): Filling, red/small blisters or bruises, mild/mod discomfort  Hold (Positioning): Assistance needed to correctly position infant at breast and maintain latch.  LATCH Score: 7  Interventions    Lactation Tools Discussed/Used     Consult Status Consult Status: Follow-up Date: 09/13/19 Follow-up type: In-patient    Ave Filter 09/12/2019, 2:48 PM

## 2019-09-14 ENCOUNTER — Ambulatory Visit: Payer: Self-pay

## 2019-09-14 NOTE — Lactation Note (Signed)
This note was copied from a baby's chart. Lactation Consultation Note  Patient Name: Hannah Ryan [id number] Date: 09/14/2019   Baby 1 bays old and mother resting with baby. Offered to put baby in crib. Mother is breastfeeding and formula feeding. Feed on demand with cues.  Goal 8-12+ times per day.  Place baby STS if not cueing.  Reviewed engorgement care.     Maternal Data    Feeding Feeding Type: Bottle Fed - Formula Nipple Type: Slow - flow  LATCH Score                   Interventions    Lactation Tools Discussed/Used     Consult Status      Carlye Grippe 09/14/2019, 10:00 AM

## 2019-09-17 NOTE — BH Specialist Note (Deleted)
Integrated Behavioral Health Follow Up Visit  MRN: [id number] Name: Hannah Ryan  Number of [place] Clinician visits: 2/6 Session Start time: 2:15***  Session End time: 2:45*** Total time: {IBH Total Time:21014050}  Type of Service: [place] Interpretor:No. Interpretor Name and Language: n/a  SUBJECTIVE: Avon Molock is a 28 y.o. female accompanied by n/a*** Patient was referred by Loma Boston, DO for OUD. Patient reports the following symptoms/concerns: *** Duration of problem: Current pregnancy; Severity of problem: moderate***  OBJECTIVE: Mood: {BHH MOOD:22306} and Affect: {BHH AFFECT:22307} Risk of harm to self or others: {CHL AMB BH Suicide Current Mental Status:21022748}  LIFE CONTEXT: Family and Social: *** School/Work: *** Self-Care: *** Life Changes: Current pregnancy, housing instability ***  GOALS ADDRESSED: Patient will: 1.  Reduce symptoms of: {IBH Symptoms:21014056}  2.  Increase knowledge and/or ability of: {IBH Patient Tools:21014057}  3.  Demonstrate ability to: {IBH Goals:21014053}  INTERVENTIONS: Interventions utilized:  {IBH Interventions:21014054} Standardized Assessments completed: {IBH Screening Tools:21014051}  ASSESSMENT: Patient currently experiencing Psychosocial stress and OUD ***  Patient may benefit from continued ***.  PLAN: 1. Follow up with behavioral health clinician on : *** 2. Behavioral recommendations:  -*** -***(prenatal,suboxone, preg case management, workforce, [place], virtual tour)*** 3. Referral(s): {IBH Referrals:21014055} 4. "From scale of 1-10, how likely are you to follow plan?": ***  Caroleen Hamman Exilda Wilhite, LCSW

## 2019-09-23 ENCOUNTER — Ambulatory Visit: Payer: Medicaid Other

## 2019-09-26 ENCOUNTER — Other Ambulatory Visit: Payer: Self-pay | Admitting: Family Medicine

## 2019-09-26 DIAGNOSIS — F1120 Opioid dependence, uncomplicated: Secondary | ICD-10-CM

## 2019-09-26 MED ORDER — BUPRENORPHINE HCL-NALOXONE HCL 8-2 MG SL FILM: 1.0000 | ORAL_FILM | Freq: Three times a day (TID) | SUBLINGUAL | 0 refills | Status: DC

## 2019-09-26 NOTE — Telephone Encounter (Signed)
LM for pt that the medication that she requested has refilled for two weeks.  I also will call her on Monday in regards to a referral appt. If she has questions to please call the office.

## 2019-09-26 NOTE — Telephone Encounter (Signed)
Called pt and pt stated that she was about to run out of her Suboxone and needs a refill.  I informed pt that now that she has delivered we will not be able to manage her on Subxone she would need to reach out to treatment center or get with her PCP.  Pt stated that she has called several treatment centers and they are saying that it is going to cost her $125 per week or more and pt does not have money for that.  I advised pt that I would have our [place] reach out to her for resources.  I will also send a message to Dr. Nehemiah Settle about refilling her Suboxone.

## 2019-09-26 NOTE — Telephone Encounter (Signed)
Patient is requesting a call back today. °

## 2019-09-26 NOTE — Telephone Encounter (Signed)
Prescription refilled. Will refer to Internal medicine for suboxone treatment as patient delivered. Will continue to prescribe until transition has been made.

## 2019-09-29 ENCOUNTER — Other Ambulatory Visit: Payer: Self-pay

## 2019-09-29 ENCOUNTER — Other Ambulatory Visit: Payer: Self-pay | Admitting: Family Medicine

## 2019-09-29 ENCOUNTER — Encounter: Payer: Self-pay | Admitting: *Deleted

## 2019-09-29 ENCOUNTER — Ambulatory Visit (INDEPENDENT_AMBULATORY_CARE_PROVIDER_SITE_OTHER): Payer: Medicaid Other | Admitting: Obstetrics and Gynecology

## 2019-09-29 DIAGNOSIS — T8149XA Infection following a procedure, other surgical site, initial encounter: Secondary | ICD-10-CM

## 2019-09-29 MED ORDER — BUPRENORPHINE HCL-NALOXONE HCL 4-1 MG SL FILM: 1.0000 | ORAL_FILM | Freq: Three times a day (TID) | SUBLINGUAL | 0 refills | Status: DC

## 2019-09-29 MED ORDER — CEPHALEXIN 500 MG PO CAPS: 500.0000 mg | ORAL_CAPSULE | Freq: Three times a day (TID) | ORAL | 0 refills | Status: DC

## 2019-09-29 MED ORDER — OXYCODONE-ACETAMINOPHEN 5-325 MG PO TABS: 1.0000 | ORAL_TABLET | ORAL | 0 refills | Status: DC | PRN

## 2019-09-29 NOTE — Telephone Encounter (Signed)
Received recommendation for referral to call [facility] Internal Medicine @ [phone] for management of Suboxone.   Attempted to call Internal Medicine and was unable to reach someone from the office.

## 2019-09-29 NOTE — Progress Notes (Signed)
Ms Labrador was here today for nurse visit for incision check S/P LTCS 09/09/19 Pt has noted drainage from incision and pain. Denies fever or chills. No bowel or bladder dysfunction Breast and bottle feeding  PE AF VSS Obese female in NAD Lungs clear Heart RRR Abd soft + BS obese Incision with some drainage, pt unable to tolerate exam  A/P Wound infection Wound care reviewed with pt. Will start antibiotics. Pain medication as needed Pt has follow up appt next week. Hopefully will be able to avoid opening of incision.

## 2019-09-29 NOTE — Patient Instructions (Addendum)
EMERGENCY: 911  OPIOID DETOX (and treatment)  See below for opioid-specific detox and treatment programs, and note that area hospitals and emergency departments have LIMITED opioid-detox availability, often only for medical complications. For pregnant patients, only [facility] and [facility].  [facility]-Call [phone] for initial phone assessment, accept most insurance; Medicaid for treatment only(not detox, but will refer Medicaid patients out for detox), ShowDirectories.co.za for information. [address] [place], [place] [address]  [facility] - (Ask your medical provider for a referral, they will not accept self-referrals, they do have program for pregnant patients), located in [place], [place]  [facility] & [place]- Call [phone] accepts both Medicaid and the uninsured www.freedomhouserecovery.org   [facility] - For detox, come in to emergency department at [address] [place], [place] [address]. You will need to be medically cleared first. Please bring all of your medications with you. They do accept pregnant patients for opioid detox.   [facility]- Call [phone] for more information about detox program and initial phone assessment, they accept Medicaid and uninsured only, no pregnancy detox ------------------------------------------------------  MEDICAID patients: Call [facility] at 641-835-4895 or www.sandhillscenter.[address] , [address], [place], [place] [address] to find providers who accept Medicaid   Other insurance: Freight forwarder company (number often on back of insurance card) to find out about providers in your area who take your insurance ------------------------------------------------------  HARM REDUCTION and PREVENTION:   Naloxone- See [facility]aring Services below, to obtain Naloxone (for overdose reversals)   [employer] AIDS training and Education Center([employer], [place])  BestTheory.uy is the  state-wide HIV/STI/hepatitis C training center for health professionals and advocates in the state of [place]. Professionals interested in training on diseases related to the opioid epidemic should contact the training center.   [facility] [place] (220)007-7963 or tbennett@insightnc .org or [email or web address]  [address], [place], [place] [address] Prevention, NOT treatment *Check [facility] or [facility] of [place] on facebook  Mother-to-Baby [place]: Medications and More during Pregnancy and Breastfeeding- Questions about medications and substances during pregnancy and while breastfeeding, call ([phone] or text questions to 469-752-8434 https://mothertobaby.org  METHADONE, SUBOXONE  [facility] [employer] (ADS) [place] 608-342-7064 or www.adsyes.com [address] [place], [place] [address] * Methadone Program: call [phone], extension 237, Talk to [facility] for an appointment * Non-opioid walk-in on Mondays, Tuesdays, Fridays, 1:30-3:00pm, or call to make an appointment  [facility] [phone]  [address] [place], [place] * Methadone, $14 per day/ Suboxone, $125 per week self-pay (do take [facility]) * First visit walk-in between 5:30am-8:30am, Monday through Friday, with picture ID and Medicaid card(if you have one)  [facility] [phone] Www.eleanorhealth.com  [employer], Medicaid, Medicare, some uninsured  [employer] [phone]  [address], [place], [place] [address]  *Medicaid accepted  [facility] [phone] to set up appointment [phone] or www.newseason.com 216-642-1283) [address], [address], [place], [place] [address] * Methadone, $14 per day/ Buprenorphine, $19 per day/ Suboxone, $24 per day *Bring photo ID, must have opiates in system and at least  one year of usage prior to admittance to program  [facility] [phone] (Ask to speak to Jaci Carrel) or www.[facility]adbehavior[facility]resources.com  [address], [place], [place] [address] *Accepts most insurance and Medicaid *Locations also in [place], [place], [place] *Some Suboxone treatment  [facility]  [place] [phone]  [address], [place], [place], [place] [address] *Call ahead for Suboxone appointment  TREATMENT FACILITIES (No methadone or suboxone) [place] 5733104627 or www.caringservices.[place], [place], [place] [address] *Naloxone Distribution  *Outpatient and Transitional Housing  [facility] [phone] [place], [place], [place] [address] *Must detox first (see detox locations above) *Requirements: Be uninsured OR have Medicaid, be a [employer] resident, and stay a minimum 30 days for inpatient treatment  [facility]  ([phone] Www.eleanorhealth.com  [facility] 475-492-6488 or www.fellowshiphall.com  [address] [place], [place] [address] *Detox, residential, partial hospitalization, intensive outpatient(day/evening), extended treatment, transitional housing, sober living *Call for [employer], no [employer] [phone] or www.legacyfreedom.com [address], [place], [place] [address] *Call ([phone] after hours, or for information about locations in [place], [place], [place], and [place], [place] *No Medicaid or [employer], limited scholarships available  * NOT a 12-step program  [employer]  A supportive community for homeless women in recovery from substance abuse (336) (571)506-0274 or email at maryshousegso@aol .com for more information www.maryshousegso.[place] 878-592-2381 or www.ringercenter.com  [place], [place], [place], [place] Carol L Sena, MD and  [address], NP *detox up to 3 months, most insurance and Medicaid  [facility] [place] [phone] or www.roominn.org  *Comprehensive program helping homeless, single, pregnant women (with or without previous children), during pregnancy and after birth  *Must be a [place] resident, at least [redacted] weeks pregnant, with no severe psychiatric history   INDIVIDUAL TREATMENT PROVIDERS Dr. [phone] [place], [address], [place], [place], [phone] Dr. [phone], [address], [place], [place] [phone] Dr. [phone] [place], [address], [place], [place] [phone] Dr [phone] Ginny Forth, [address], [place], [place], [place], [phone] Dr. [phone] A. Claudean Kinds [address], [place], [place], [place] [phone] Dr. [phone], [address], [place], [place]  [phone] Dr. [phone]. Jennette Kettle [address], [place], [place], [phone]  Dr. [phone], [address] [place] [address], [place], [place], [phone], [place], [address], [place], [place] [phone], Private pay only                                                         SUPPORT GROUPS  Overdose Loss Support Group (364)360-5605 or www.hospicegso.org *Contact Kimberly Grove(5206740677 or kgrove@hospicegso .org) or Mary [place](425 459 6717 or measton@hospicegso .org) for date, time, location, and more information about support group  [place] (865)604-5165 or ([phone] [address] [place], [place] *Peer support for students and collegiate recovery community, NO TREATMENT, SUPPORT ONLY FOR STUDENTS AND FUTURE STUDENTS  **All information above is for informational purposes only, and is subject to change, please contact agencies and/or providers to find out any updated  information   Wound Care, Adult Taking care of your wound properly can help to prevent pain, infection, and scarring. It can also help your wound to heal more quickly. How to care for your wound Wound care      Follow instructions from your health care provider about how to take care of your wound. Make sure  you: ? Wash your hands with soap and water before you change the bandage (dressing). If soap and water are not available, use hand sanitizer. ? Change your dressing as told by your health care provider. ? Leave stitches (sutures), skin glue, or adhesive strips in place. These skin closures may need to stay in place for 2 weeks or longer. If adhesive strip edges start to loosen and curl up, you may trim the loose edges. Do not remove adhesive strips completely unless your health care provider tells you to do that.  Check your wound area every day for signs of infection. Check for: ? Redness, swelling, or pain. ? Fluid or blood. ? Warmth. ? Pus or a bad smell.  Ask your health care provider if you should clean the wound with mild soap and water. Doing this may include: ? Using a clean towel to pat the wound dry after cleaning it. Do not rub or scrub the wound. ? Applying a cream or ointment. Do this only as told by your health care provider. ? Covering the incision with a clean dressing.  Ask your health care provider when you can leave the wound uncovered.  Keep the dressing dry until your health care provider says it can be removed. Do not take baths, swim, use a hot tub, or do anything that would put the wound underwater until your health care provider approves. Ask your health care provider if you can take showers. You may only be allowed to take sponge baths. Medicines   If you were prescribed an antibiotic medicine, cream, or ointment, take or use the antibiotic as told by your health care provider. Do not stop taking or using the antibiotic even if your condition  improves.  Take over-the-counter and prescription medicines only as told by your health care provider. If you were prescribed pain medicine, take it 30 or more minutes before you do any wound care or as told by your health care provider. General instructions  Return to your normal activities as told by your health care provider. Ask your health care provider what activities are safe.  Do not scratch or pick at the wound.  Do not use any products that contain nicotine or tobacco, such as cigarettes and e-cigarettes. These may delay wound healing. If you need help quitting, ask your health care provider.  Keep all follow-up visits as told by your health care provider. This is important.  Eat a diet that includes protein, vitamin A, vitamin C, and other nutrient-rich foods to help the wound heal. ? Foods rich in protein include meat, dairy, beans, nuts, and other sources. ? Foods rich in vitamin A include carrots and dark green, leafy vegetables. ? Foods rich in vitamin C include citrus, tomatoes, and other fruits and vegetables. ? Nutrient-rich foods have protein, carbohydrates, fat, vitamins, or minerals. Eat a variety of healthy foods including vegetables, fruits, and whole grains. Contact a health care provider if:  You received a tetanus shot and you have swelling, severe pain, redness, or bleeding at the injection site.  Your pain is not controlled with medicine.  You have redness, swelling, or pain around the wound.  You have fluid or blood coming from the wound.  Your wound feels warm to the touch.  You have pus or a bad smell coming from the wound.  You have a fever or chills.  You are nauseous or you vomit.  You are dizzy. Get help right away if:  You have a red  streak going away from your wound.  The edges of the wound open up and separate.  Your wound is bleeding, and the bleeding does not stop with gentle pressure.  You have a rash.  You faint.  You have  trouble breathing. Summary  Always wash your hands with soap and water before changing your bandage (dressing).  To help with healing, eat foods that are rich in protein, vitamin A, vitamin C, and other nutrients.  Check your wound every day for signs of infection. Contact your health care provider if you suspect that your wound is infected. This information is not intended to replace advice given to you by your health care provider. Make sure you discuss any questions you have with your health care provider. Document Released: 07/18/2008 Document Revised: 01/27/2019 Document Reviewed: 04/25/2016 Elsevier Patient Education  2020 [employer].

## 2019-09-29 NOTE — Progress Notes (Signed)
PT here for incision ck. States incision is very sore, esp on R side and has been draining since left hospital. Areas around R side of incision is red with odorous serous drainage. Pt denies fever but occ chills. Talked with Dr Arlina Robes who saw pt . See his notes

## 2019-09-30 ENCOUNTER — Telehealth (INDEPENDENT_AMBULATORY_CARE_PROVIDER_SITE_OTHER): Payer: Medicaid Other | Admitting: Lactation Services

## 2019-09-30 NOTE — Telephone Encounter (Signed)
Pharmacy called in regards to pr prescriptions for Percocet and Suboxone from 2 different doctors. Pharmacist, Monica Martinez reports it has been resolved and no further action needed.

## 2019-10-01 NOTE — Telephone Encounter (Signed)
Attempted to contact [facility] Internal Medicine x 2 and the phone just keeps ringing and ringing with no picking up.  Halfway to see if they have a different contact # for Internal Medicine and wanted to confirm if they help mange pt's on Suboxone.  I was informed the same number that kept on ringing and that they do not see pts to help manage Suboxone tx.

## 2019-10-07 ENCOUNTER — Encounter: Payer: Self-pay | Admitting: Family Medicine

## 2019-10-07 ENCOUNTER — Ambulatory Visit: Payer: Medicaid Other | Admitting: Family Medicine

## 2019-10-20 ENCOUNTER — Telehealth: Payer: Medicaid Other | Admitting: Nurse Practitioner

## 2019-10-21 ENCOUNTER — Ambulatory Visit (INDEPENDENT_AMBULATORY_CARE_PROVIDER_SITE_OTHER): Payer: Medicaid Other | Admitting: Family Medicine

## 2019-10-21 ENCOUNTER — Other Ambulatory Visit: Payer: Self-pay

## 2019-10-21 ENCOUNTER — Encounter: Payer: Self-pay | Admitting: Family Medicine

## 2019-10-21 DIAGNOSIS — T8149XA Infection following a procedure, other surgical site, initial encounter: Secondary | ICD-10-CM

## 2019-10-21 DIAGNOSIS — F1120 Opioid dependence, uncomplicated: Secondary | ICD-10-CM | POA: Insufficient documentation

## 2019-10-21 DIAGNOSIS — F53 Postpartum depression: Secondary | ICD-10-CM

## 2019-10-21 DIAGNOSIS — Z1389 Encounter for screening for other disorder: Secondary | ICD-10-CM | POA: Diagnosis not present

## 2019-10-21 DIAGNOSIS — Z5181 Encounter for therapeutic drug level monitoring: Secondary | ICD-10-CM | POA: Insufficient documentation

## 2019-10-21 DIAGNOSIS — F1910 Other psychoactive substance abuse, uncomplicated: Secondary | ICD-10-CM

## 2019-10-21 DIAGNOSIS — O99345 Other mental disorders complicating the puerperium: Secondary | ICD-10-CM

## 2019-10-21 DIAGNOSIS — F439 Reaction to severe stress, unspecified: Secondary | ICD-10-CM

## 2019-10-21 DIAGNOSIS — O9A319 Physical abuse complicating pregnancy, unspecified trimester: Secondary | ICD-10-CM

## 2019-10-21 MED ORDER — BUPRENORPHINE HCL-NALOXONE HCL 4-1 MG SL FILM: 1.0000 | ORAL_FILM | Freq: Three times a day (TID) | SUBLINGUAL | 0 refills | Status: DC

## 2019-10-21 MED ORDER — SERTRALINE HCL 50 MG PO TABS: 50.0000 mg | ORAL_TABLET | Freq: Every day | ORAL | 2 refills | Status: DC

## 2019-10-21 MED ORDER — SULFAMETHOXAZOLE-TRIMETHOPRIM 800-160 MG PO TABS: 1.0000 | ORAL_TABLET | Freq: Two times a day (BID) | ORAL | 0 refills | Status: AC

## 2019-10-21 NOTE — Progress Notes (Signed)
intSubjective:     Hannah Ryan is a 28 y.o. female who presents for a postpartum visit. She is 5 weeks postpartum following a low cervical transverse Cesarean section. I have fully reviewed the prenatal and intrapartum course. The delivery was at 38.4 gestational weeks. Outcome: repeat cesarean section, low transverse incision. Anesthesia: spinal. Postpartum course has been complicated with wound infection. 46 course has been normal. Baby is feeding by both breast and bottle - Carnation Good Start. Bleeding no bleeding. Bowel function is abnormal: patient reports constipation. Bladder function is normal. Patient is not sexually active. Contraception method is none. Patient is interested in OCPs versus Nexplanon. Postpartum depression screening: positive.  The following portions of the patient's history were reviewed and updated as appropriate: allergies, current medications, past family history, past medical history, past social history, past surgical history and problem list.  Review of Systems Pertinent items are noted in HPI.   Objective:   BP 123/84   Pulse 85   Ht 5\' 8"  (1.727 m)   Wt (!) 384 lb (174.2 kg)   LMP 12/13/2018 (Exact Date)   Breastfeeding Yes   BMI 58.39 kg/m     LMP 12/13/2018 (Exact Date)   General:  alert and cooperative  Lungs: clear to auscultation bilaterally  Heart:  regular rate and rhythm, S1, S2 normal, no murmur, click, rub or gallop  Abdomen: soft, non-tender. incision mostly healed - has a small 1-71mm openings of her incision on the right corner that is leaking fluid. Slightly erythema.        Assessment:     normal postpartum exam. Pap smear not done at today's visit.   Plan:    1. Contraception: Nexplanon - will place next visit 2. PPD - start zoloft 3. Suboxone - did smoke marijuana due to stress and PP depression. No opiate abuse. Continue suboxone. Will refer to internal medicine for continued suboxone maintenance. Will continue to  prescribe until transition made. 4. Wound infection - bactrim x10 days. discussed ways to keep incision dry from leaking fluid - pad on incision and change frequently.  5. Follow up in: 2 weeks or as needed.

## 2019-10-22 NOTE — BH Specialist Note (Signed)
Pt did not arrive to video visit and did not answer the phone ; Left HIPPA-compliant message to call back Roselyn Reef from [facility] at [phone].  ; left MyChart message for patient.    [place] via Telemedicine Video Visit  10/22/2019 Hannah Ryan [id number]  [name]

## 2019-10-31 ENCOUNTER — Ambulatory Visit: Payer: Medicaid Other | Admitting: Clinical

## 2019-10-31 DIAGNOSIS — Z5329 Procedure and treatment not carried out because of patient's decision for other reasons: Secondary | ICD-10-CM

## 2019-10-31 DIAGNOSIS — Z91199 Patient's noncompliance with other medical treatment and regimen due to unspecified reason: Secondary | ICD-10-CM

## 2019-11-03 ENCOUNTER — Ambulatory Visit: Payer: Medicaid Other | Admitting: Family Medicine

## 2019-11-07 ENCOUNTER — Other Ambulatory Visit: Payer: Self-pay

## 2019-11-07 ENCOUNTER — Ambulatory Visit (INDEPENDENT_AMBULATORY_CARE_PROVIDER_SITE_OTHER): Payer: Medicaid Other | Admitting: Family Medicine

## 2019-11-07 ENCOUNTER — Encounter: Payer: Self-pay | Admitting: Family Medicine

## 2019-11-07 ENCOUNTER — Other Ambulatory Visit: Payer: Self-pay | Admitting: Obstetrics and Gynecology

## 2019-11-07 ENCOUNTER — Ambulatory Visit: Payer: Medicaid Other | Admitting: Family Medicine

## 2019-11-07 VITALS — BP 148/77 | HR 88 | Wt 379.0 lb

## 2019-11-07 DIAGNOSIS — Z30017 Encounter for initial prescription of implantable subdermal contraceptive: Secondary | ICD-10-CM

## 2019-11-07 DIAGNOSIS — F1120 Opioid dependence, uncomplicated: Secondary | ICD-10-CM

## 2019-11-07 LAB — POCT PREGNANCY, URINE: Preg Test, Ur: NEGATIVE

## 2019-11-07 MED ORDER — ETONOGESTREL 68 MG ~~LOC~~ IMPL
68.0000 mg | DRUG_IMPLANT | Freq: Once | SUBCUTANEOUS | Status: AC
  Administered 2019-11-07: 68 mg via SUBCUTANEOUS

## 2019-11-07 MED ORDER — BUPRENORPHINE HCL-NALOXONE HCL 4-1 MG SL FILM: 1.0000 | ORAL_FILM | Freq: Three times a day (TID) | SUBLINGUAL | 0 refills | Status: DC

## 2019-11-07 MED ORDER — SERTRALINE HCL 100 MG PO TABS: 100.0000 mg | ORAL_TABLET | Freq: Every day | ORAL | 3 refills | Status: AC

## 2019-11-07 NOTE — Patient Instructions (Signed)
Nexplanon Instructions After Insertion  Keep bandage clean and dry for 24 hours  May use ice/Tylenol/Ibuprofen for soreness or pain  If you develop fever, drainage or increased warmth from incision site-contact office immediately   

## 2019-11-07 NOTE — Addendum Note (Signed)
Addended by: Levie Heritage on: 11/07/2019 01:00 PM   Modules accepted: Orders

## 2019-11-07 NOTE — Progress Notes (Signed)
Depression still getting to her states her Zoloft isn't helping much lately. Home issues getting to her, disabled twin brother and just life getting to her.  Wants Refill of Suboxone also [facility] [address] [facility]  Will reschedule with Ashok Croon # is [phone]   Here for Nexplanon insertion

## 2019-11-07 NOTE — Progress Notes (Signed)
Nexplanon Insertion:  Patient given informed consent, signed copy in the chart, time out was performed. Pregnancy test was negative. Appropriate time out taken.  Patient's left arm was prepped and draped in the usual sterile fashion.. The ruler used to measure and mark insertion area.  Pt was prepped with alcohol swab and then injected with 3 cc of 1% lidocaine with epinephrine.  Pt was prepped with betadine, Implanon removed form packaging,  Device confirmed in needle, then inserted full length of needle and withdrawn per handbook instructions.  Device palpated by physician and patient.  Pt insertion site covered with pressure dressing.   Minimal blood loss.  Pt tolerated the procedure well.    Skin incision well healed. Opiate use disorder - no complication. No deviance. Will continue with suboxone. Patient referred to IM for continued treatment.

## 2019-11-10 ENCOUNTER — Encounter: Payer: Self-pay | Admitting: *Deleted

## 2019-11-18 ENCOUNTER — Encounter: Payer: Self-pay | Admitting: General Practice

## 2019-11-20 ENCOUNTER — Telehealth: Payer: Self-pay | Admitting: *Deleted

## 2019-11-20 DIAGNOSIS — F1120 Opioid dependence, uncomplicated: Secondary | ICD-10-CM

## 2019-11-20 NOTE — Telephone Encounter (Addendum)
Pt left message stating that she needs Dr. Adrian Blackwater to send refill of Suboxone. She has one dose remaining. Her next office visit is scheduled on 12/08/19.  Message forwarded to Dr. Adrian Blackwater.

## 2019-11-21 MED ORDER — BUPRENORPHINE HCL-NALOXONE HCL 4-1 MG SL FILM: 1.0000 | ORAL_FILM | Freq: Three times a day (TID) | SUBLINGUAL | 0 refills | Status: DC

## 2019-11-21 NOTE — Telephone Encounter (Signed)
Refilled. I also referred the patient to internal medicine for continued suboxone treatmeny.

## 2019-12-05 ENCOUNTER — Other Ambulatory Visit: Payer: Self-pay | Admitting: Family Medicine

## 2019-12-05 ENCOUNTER — Telehealth: Payer: Self-pay | Admitting: Clinical

## 2019-12-05 ENCOUNTER — Telehealth: Payer: Self-pay

## 2019-12-05 DIAGNOSIS — F1910 Other psychoactive substance abuse, uncomplicated: Secondary | ICD-10-CM

## 2019-12-05 MED ORDER — BUPRENORPHINE HCL-NALOXONE HCL 4-1 MG SL FILM: 1.0000 | ORAL_FILM | Freq: Three times a day (TID) | SUBLINGUAL | 0 refills | Status: AC

## 2019-12-05 NOTE — Telephone Encounter (Signed)
Pt called requesting a refill on her Suboxone that she was going to be out this am.  Per Dr. Shawnie Pons this will be the last dose of Suboxone that we will prescribe because pt needs to get a facility who can manage her.  Called pt and informed her that we can no longer prescribe her Suboxone after this refill.  I advised pt that we have in a past discussion advised her that we could not continue to refill her Suboxone and that she was to f/u with Internal Medicine about an appt.  Pt stated that she has not heard anything back them.  I encouraged pt to reach out to the office and that I would have [place], to reach out to her to give her other options.  Pt stated understanding that this is the last refill on her Suboxone and agreed to [place] calling her with resources.    Hannah Naegeli, RN

## 2019-12-05 NOTE — Telephone Encounter (Signed)
Called pt with number to call to talk to [facility] for ongoing Suboxone refills; pt is aware that additional resources have been sent via MyChart message for treatment options.   Pt agrees to call [facility] this afternoon to begin services with [facility].

## 2019-12-05 NOTE — Progress Notes (Signed)
Referral to [facility] for Suboxone refills post delivery.

## 2019-12-08 ENCOUNTER — Ambulatory Visit: Payer: Medicaid Other | Admitting: Family Medicine

## 2019-12-08 ENCOUNTER — Telehealth: Payer: Self-pay

## 2019-12-08 NOTE — Telephone Encounter (Signed)
Called patient to advise of missed appointment and to advise her that I have been in contact with [place] internal medicine for her suboxone treatment to re delegate her care from [place] to them. Patient advised me that we told her that we would no longer refill any of her Medications and that this past refill was the last of it. Per Dr.Stinston that is not going to be the case and that we will continue to work on getting her referred out and to keep reaching out to [place] in our office for resources. Patient Stated that she would keep in contact with [facility] and she was given information about a place in [place] and has a appointment scheduled with them [facility] coming up soon. Patient stated that she is actively trying with much push back with places to take over her suboxone. Advised that I will work on it for her.

## 2019-12-22 ENCOUNTER — Ambulatory Visit: Payer: Medicaid Other | Admitting: Family Medicine

## 2019-12-22 ENCOUNTER — Encounter: Payer: Self-pay | Admitting: Family Medicine

## 2019-12-22 NOTE — Progress Notes (Signed)
Patient did not keep appointment today. She may call to reschedule.  

## 2020-06-16 ENCOUNTER — Ambulatory Visit: Payer: Medicaid Other | Admitting: Certified Nurse Midwife

## 2020-08-26 ENCOUNTER — Ambulatory Visit (INDEPENDENT_AMBULATORY_CARE_PROVIDER_SITE_OTHER): Payer: Medicaid Other | Admitting: Primary Care

## 2020-09-08 IMAGING — US US MFM OB DETAIL+14 WK
1 series · 13 of 28 positions shown · non-contrast
Comparison: none

[Series 1: us mfm ob detail+14 wk · 13 of 117 slices shown]
[im 5/117]
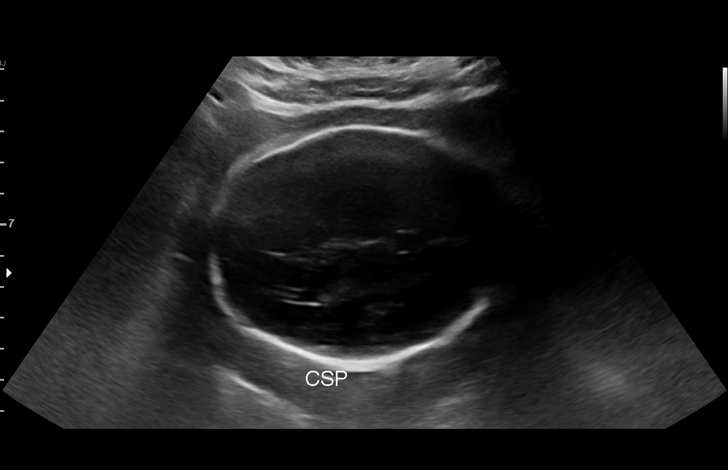
[im 13/117]
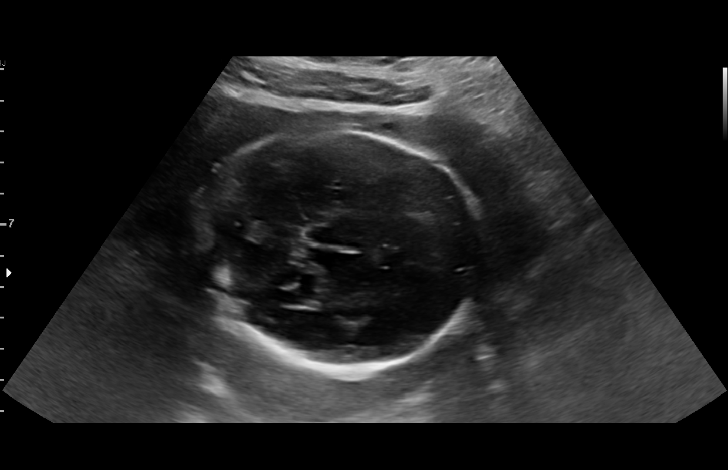
[im 22/117]
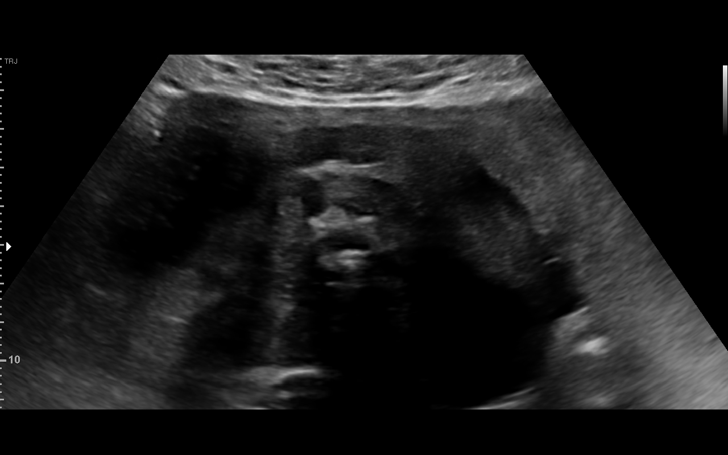
[im 31/117]
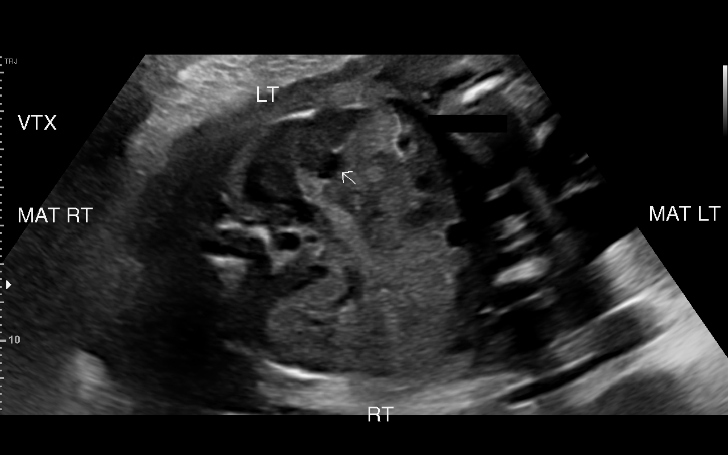
[im 39/117]
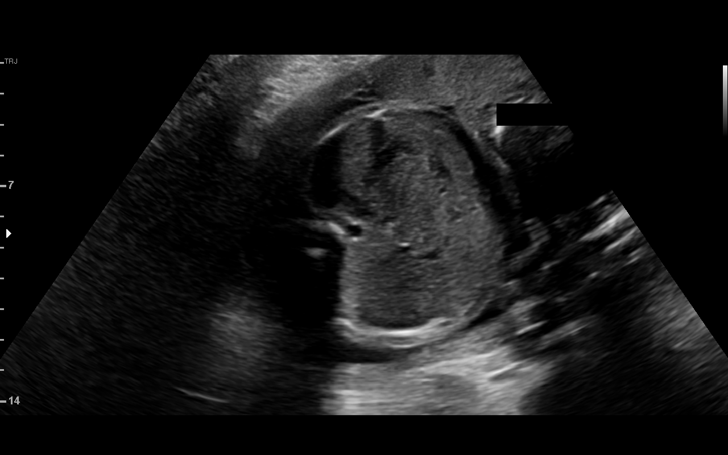
[im 48/117]
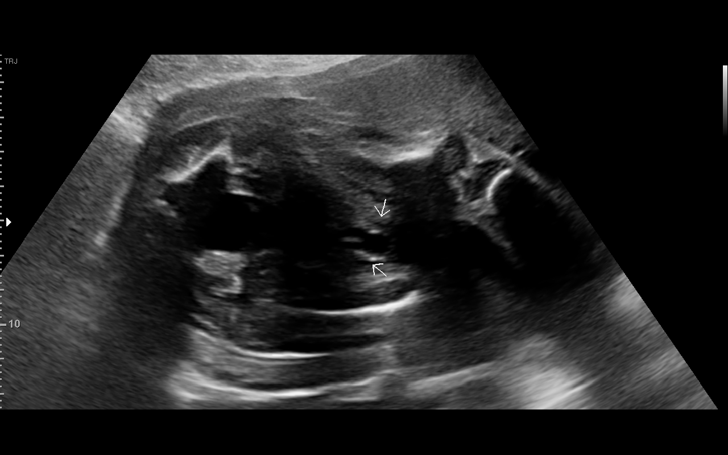
[im 61/117]
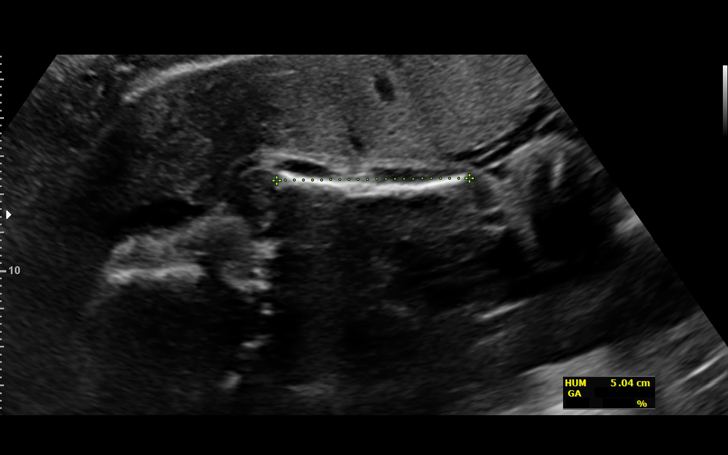
[im 69/117]
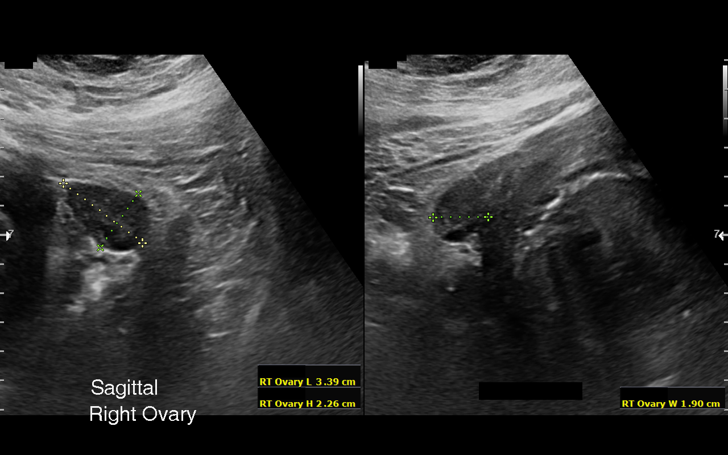
[im 78/117]
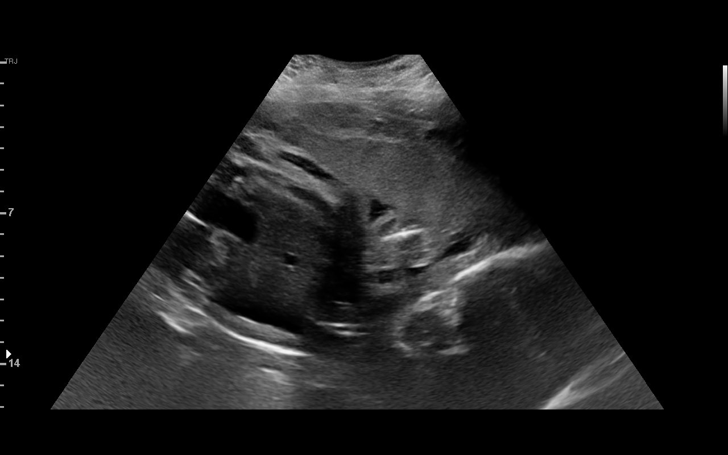
[im 86/117]
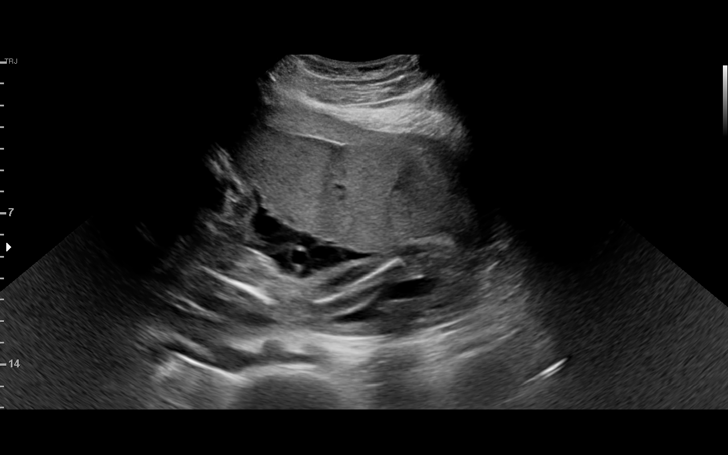
[im 95/117]
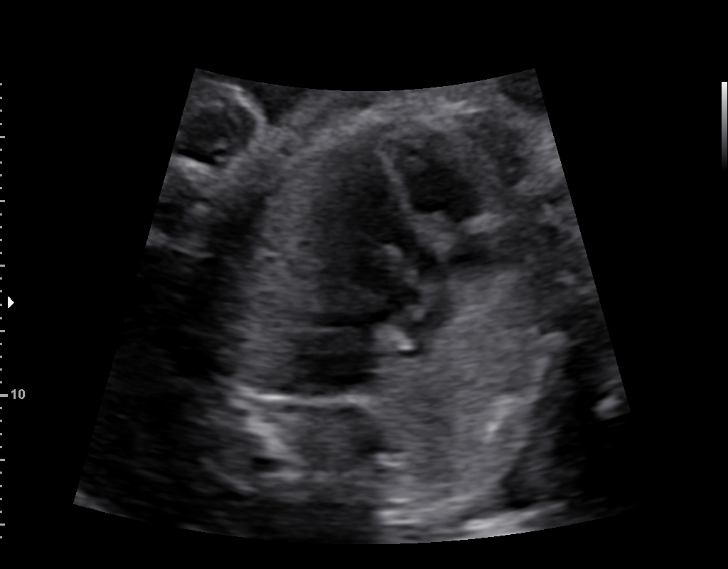
[im 104/117]
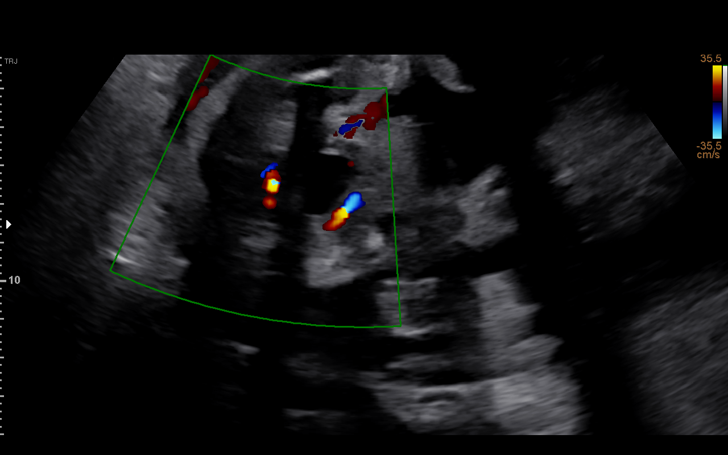
[im 112/117]
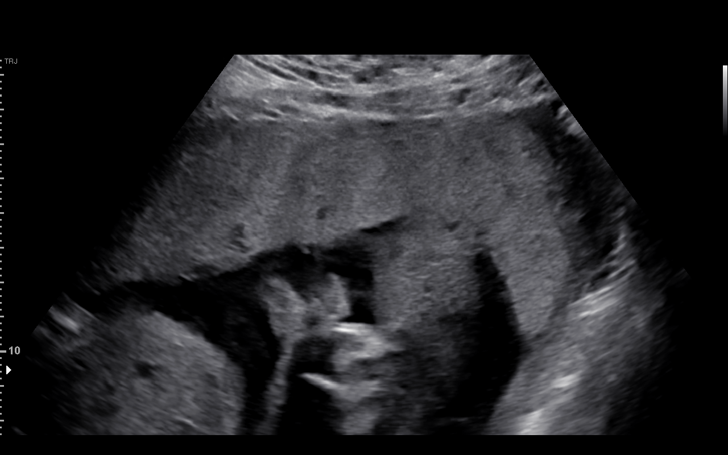

[13 of 28 positions shown; findings below may reference images not displayed]

[address]

 ----------------------------------------------------------------------

 ----------------------------------------------------------------------
Indications

  Hypertension - Chronic/Pre-existing
  High risk pregnancy due to maternal drug
  abuse (opiods, marijuana)
  Maternal morbid obesity
  Poor obstetric history: Previous severe
  preeclampsia
  Previous cesarean delivery, antepartum
  Encounter for antenatal screening for
  malformations
  29 weeks gestation of pregnancy
 ----------------------------------------------------------------------
Fetal Evaluation

 Num Of Fetuses:         1
 Fetal Heart Rate(bpm):  145
 Cardiac Activity:       Observed
 Presentation:           Cephalic
 Placenta:               Anterior
 P. Cord Insertion:      Visualized, central

 Amniotic Fluid
 AFI FV:      Within normal limits

 AFI Sum(cm)     %Tile       Largest Pocket(cm)
 20.68           82

 RUQ(cm)       RLQ(cm)       LUQ(cm)        LLQ(cm)

Biometry

 BPD:      75.5  mm     G. Age:  30w 2d         78  %    CI:        71.34   %    70 - 86
                                                         FL/HC:      20.4   %    19.6 -
 HC:      284.7  mm     G. Age:  31w 2d         82  %    HC/AC:      1.13        0.99 -
 AC:      251.6  mm     G. Age:  29w 3d         55  %    FL/BPD:     77.0   %    71 - 87
 FL:       58.1  mm     G. Age:  30w 3d         74  %    FL/AC:      23.1   %    20 - 24
 HUM:      50.6  mm     G. Age:  29w 5d         57  %
 CER:      38.9  mm     G. Age:  33w 0d       > 95  %

 LV:        4.7  mm
 CM:        5.5  mm

 Est. FW:    4952  gm      3 lb 4 oz     72  %
OB History

 Gravidity:    2         Term:   1
 Living:       1
Gestational Age

 LMP:           29w 0d        Date:  12/13/18                 EDD:   09/19/19
 U/S Today:     30w 3d                                        EDD:   09/09/19
 Best:          29w 0d     Det. By:  LMP  (12/13/18)          EDD:   09/19/19
Anatomy

 Cranium:               Appears normal         Aortic Arch:            Appears normal
 Cavum:                 Appears normal         Ductal Arch:            Not well visualized
 Ventricles:            Appears normal         Diaphragm:              Appears normal
 Choroid Plexus:        Appears normal         Stomach:                Appears normal, left
                                                                       sided
 Cerebellum:            Appears normal         Abdomen:                Appears normal
 Posterior Fossa:       Appears normal         Abdominal Wall:         Appears nml (cord
                                                                       insert, abd wall)
 Nuchal Fold:           Not applicable (>20    Cord Vessels:           Appears normal (3
                        wks GA)                                        vessel cord)
 Face:                  Not well visualized    Kidneys:                Appear normal
 Lips:                  Appears normal         Bladder:                Appears normal
 Palate:                Not well visualized    Spine:                  Seen
 Heart:                 Echogenic focus        Upper Extremities:      Lt appears normal,
                        in LV
                                                                       Rt not seen.
 RVOT:                  Not well visualized    Lower Extremities:      Appears normal
 LVOT:                  Not well visualized

 Other:  Lt heel seen. Male gender.
Cervix Uterus Adnexa

 Cervix
 Length:           4.81  cm.
 Normal appearance by transabdominal scan.

 Left Ovary
 Within normal limits.

 Right Ovary
 Within normal limits.
Comments

 This patient was seen for a detailed fetal anatomy scan due
 to chronic hypertension currently treated with labetalol 200
 mg twice a day, maternal obesity, and maternal treatment
 with Suboxone due to a history of opioid abuse.  She denies
 any other problems in her current pregnancy.
 She had a cell free DNA test earlier in her pregnancy which
 indicated a low risk for trisomy 21, 18, and 13. A male fetus is
 predicted.
 She was informed that the fetal growth and amniotic fluid
 level were appropriate for her gestational age.
 On today's exam, an intracardiac echogenic focus was noted
 in the left ventricle of the fetal heart.  The small association
 between an echogenic focus and Down syndrome was
 discussed. She reports that she is comfortable with her
 negative cell free DNA test.  There were no other cardiac
 anomalies noted today.
 The patient was informed that anomalies may be missed due
 to technical limitations. If the fetus is in a suboptimal position
 or maternal habitus is increased, visualization of the fetus in
 the maternal uterus may be impaired.
 The patient was advised that due to her history of Suboxone
 treatment during pregnancy, that her baby may need a
 prolonged hospitalization to be weaned off of opioids after
 birth.
 Due to maternal treatment with Suboxone and her history of
 chronic hypertension, we will start weekly fetal testing at
 around 32 weeks.
 A follow-up growth scan was scheduled in 4 weeks.

## 2020-11-01 ENCOUNTER — Telehealth (INDEPENDENT_AMBULATORY_CARE_PROVIDER_SITE_OTHER): Payer: Medicaid Other | Admitting: Primary Care

## 2020-11-10 IMAGING — US US MFM FETAL BPP W/ NON-STRESS
1 series · 15 of 24 positions shown · non-contrast
Comparison: none

[Series 1: us mfm fetal bpp w/ non-stress · 24 acquisitions, 15 frames shown]
[im 1/24]
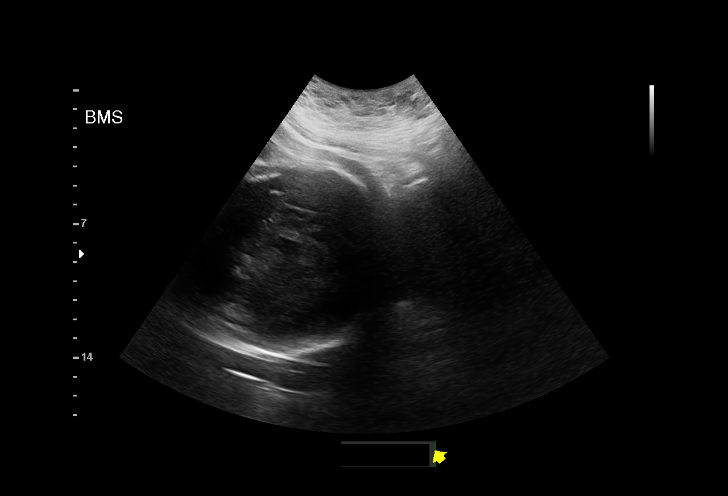
[im 3/24]
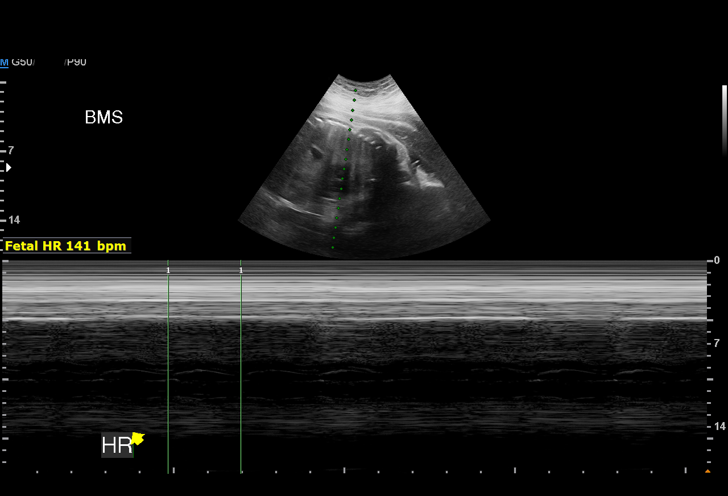
[im 5/24]
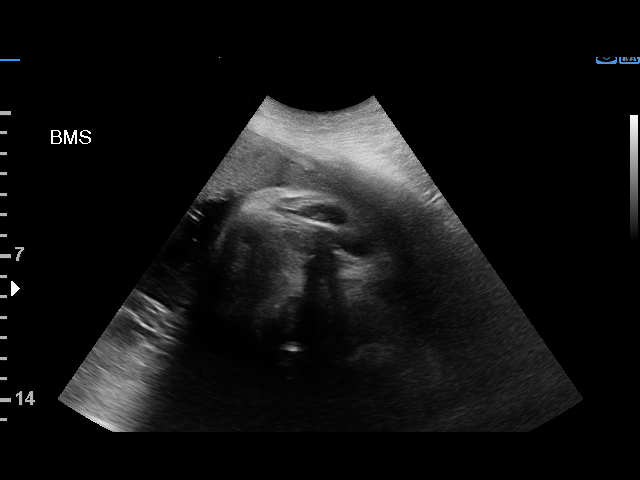
[im 6/24]
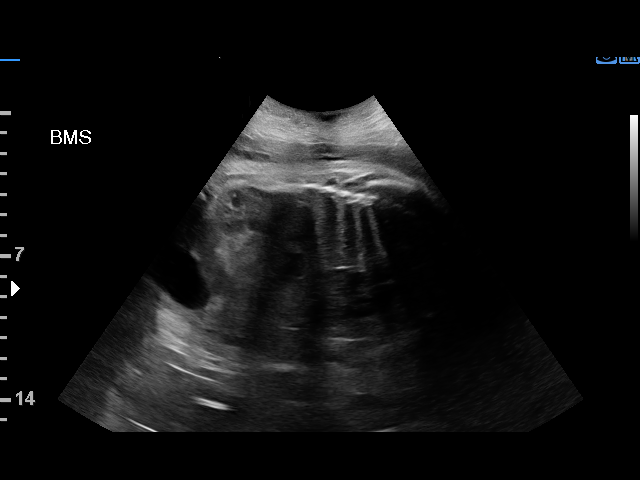
[im 8/24]
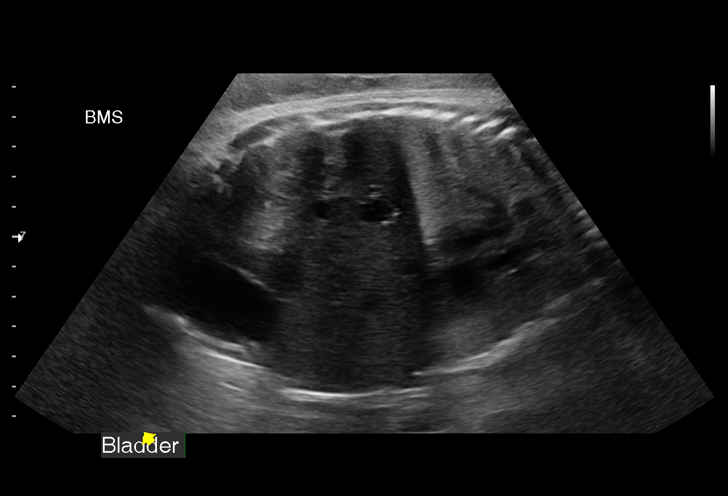
[im 9/24]
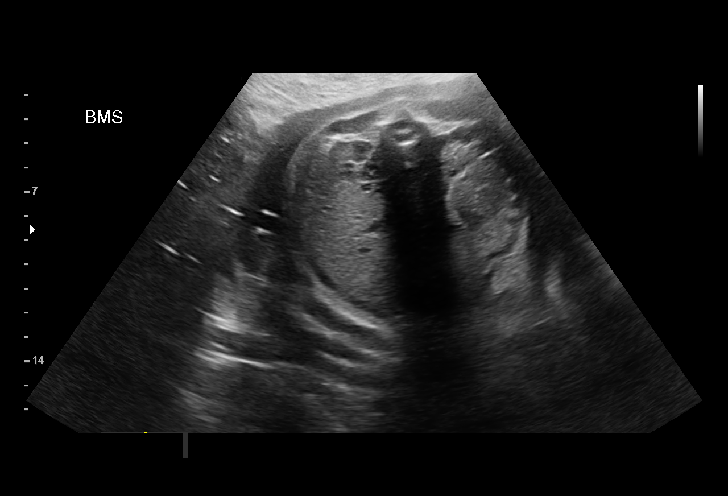
[im 11/24]
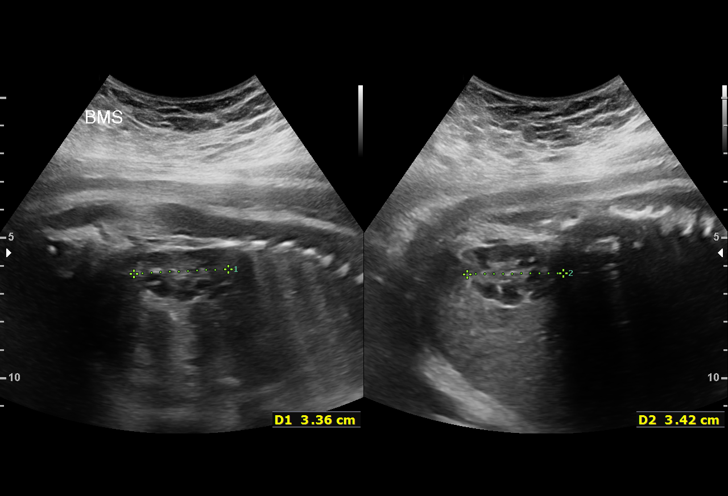
[im 13/24]
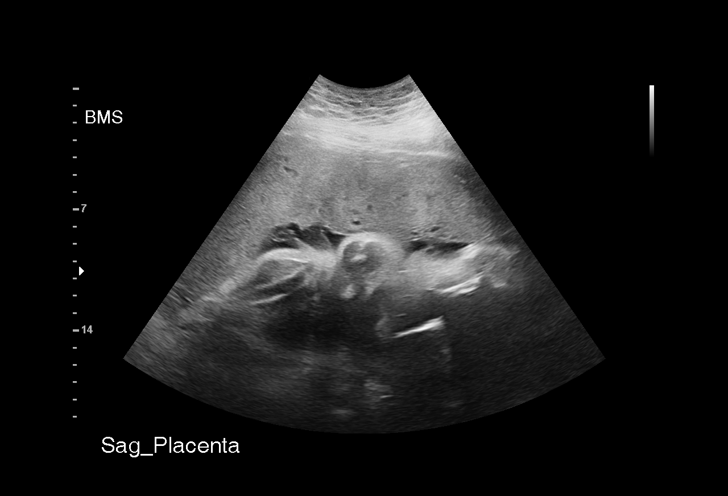
[im 14/24]
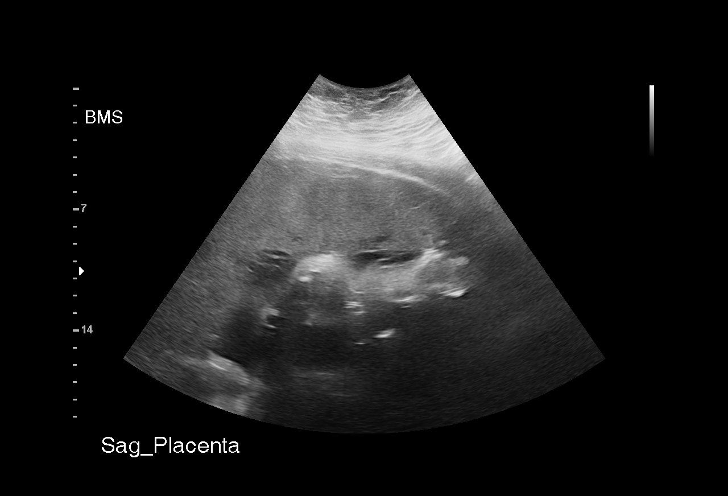
[im 16/24]
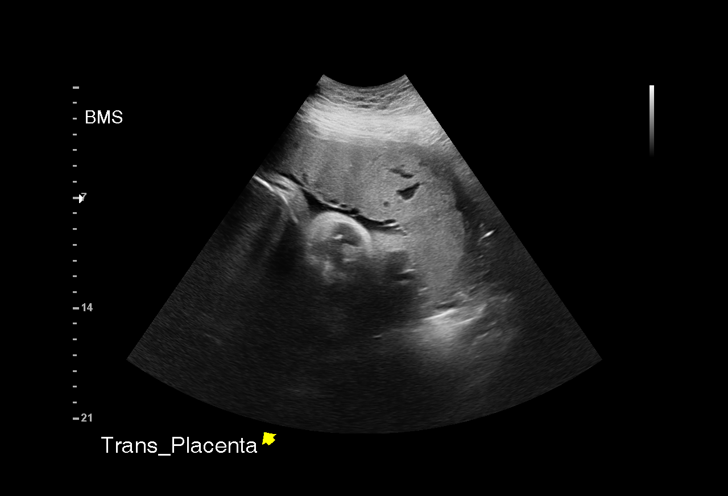
[im 17/24]
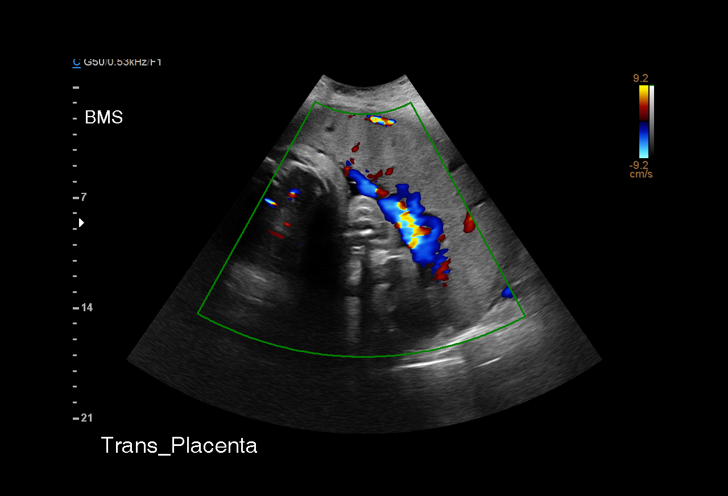
[im 19/24]
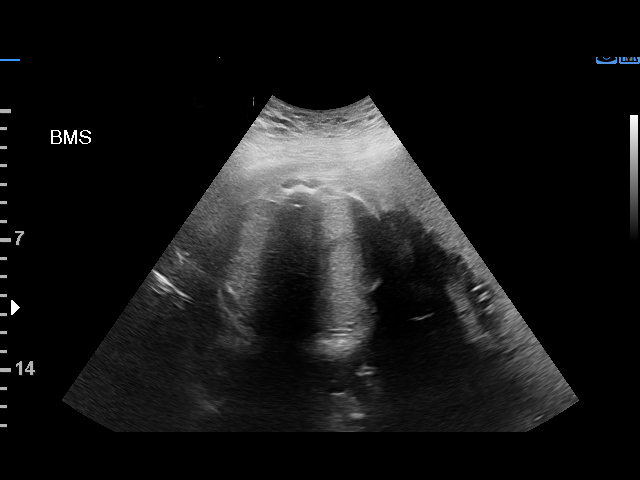
[im 21/24]
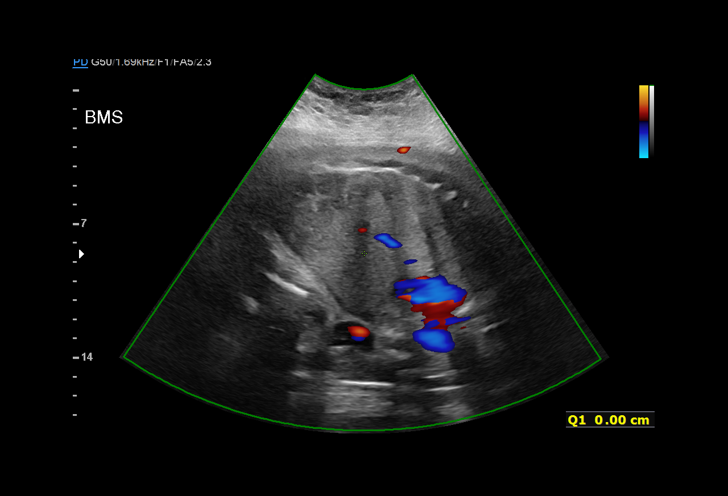
[im 22/24]
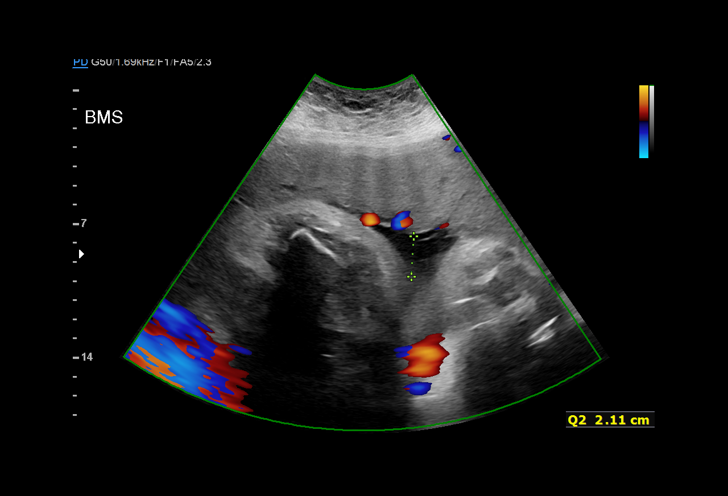
[im 24/24]
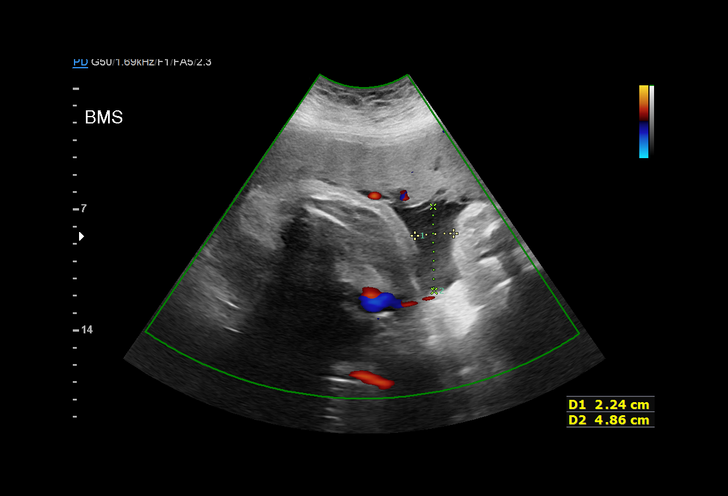

[15 of 24 positions shown; findings below may reference images not displayed]

[address]

     W/NONSTRESS
 ----------------------------------------------------------------------

 ----------------------------------------------------------------------
Indications

  Hypertension - Chronic/Pre-existing
  (labetalol)
  Encounter for other antenatal screening
  follow-up
  High risk pregnancy due to maternal drug
  abuse (polysubstance)
  Previous cesarean delivery, antepartum
  Maternal morbid obesity
  Poor obstetric history: Previous severe
  preeclampsia
  38 weeks gestation of pregnancy
 ----------------------------------------------------------------------
Vital Signs

 BMI:
Fetal Evaluation

 Num Of Fetuses:         1
 Fetal Heart Rate(bpm):  136
 Cardiac Activity:       Observed
 Presentation:           Cephalic
 Placenta:               Anterior

 Amniotic Fluid
 AFI FV:      Oligohydramnios

 AFI Sum(cm)     %Tile       Largest Pocket(cm)
 4.71            < 3

 RUQ(cm)       RLQ(cm)       LUQ(cm)        LLQ(cm)
 0             2.6           2.11           0
Biophysical Evaluation

 Amniotic F.V:   Pocket => 2 cm             F. Tone:        Not Observed
 F. Movement:    Observed                   N.S.T:          Reactive
 F. Breathing:   Observed                   Score:          [DATE]
OB History

 Gravidity:    2         Term:   1
 Living:       1
Gestational Age

 LMP:           38w 0d        Date:  12/13/18                 EDD:   09/19/19
 Best:          38w 0d     Det. By:  LMP  (12/13/18)          EDD:   09/19/19
Anatomy

 Stomach:               Appears normal, left   Kidneys:                Appear normal
                        sided
 Abdomen:               Appears normal         Bladder:                Appears normal
Cervix Uterus Adnexa

 Cervix
 Not visualized (advanced GA >16wks)
Impression

 Chronic hypertension.  Patient takes labetalol for control of
 blood pressure.  BP at our office 133/83 mmHg.

 Oligohydramnios is seen (AFI= 4 to 5 cm).  Cephalic
 presentation.  Fetal tone did not meet the criteria BPP.  NST
 is reactive.  BPP [DATE].

 Patient has chronic hypertension on medications and
 oligohydramnios and I recommended a nonurgent delivery.

 Patient would like to wait till [REDACTED] morning and get
 delivered.

 I discussed with Dr. Semoko who will be scheduling
 delivery. Patient was informed that she will receive a call from
 scheduling.
                 Aujla, Jaylon

## 2021-06-02 DIAGNOSIS — F4310 Post-traumatic stress disorder, unspecified: Secondary | ICD-10-CM | POA: Diagnosis not present

## 2021-06-02 DIAGNOSIS — F341 Dysthymic disorder: Secondary | ICD-10-CM | POA: Diagnosis not present

## 2021-06-02 DIAGNOSIS — F5105 Insomnia due to other mental disorder: Secondary | ICD-10-CM | POA: Diagnosis not present

## 2021-06-02 DIAGNOSIS — F411 Generalized anxiety disorder: Secondary | ICD-10-CM | POA: Diagnosis not present

## 2021-07-12 DIAGNOSIS — F341 Dysthymic disorder: Secondary | ICD-10-CM | POA: Diagnosis not present

## 2021-07-12 DIAGNOSIS — F4310 Post-traumatic stress disorder, unspecified: Secondary | ICD-10-CM | POA: Diagnosis not present

## 2021-07-12 DIAGNOSIS — F411 Generalized anxiety disorder: Secondary | ICD-10-CM | POA: Diagnosis not present

## 2021-07-12 DIAGNOSIS — F5105 Insomnia due to other mental disorder: Secondary | ICD-10-CM | POA: Diagnosis not present

## 2021-10-06 DIAGNOSIS — E559 Vitamin D deficiency, unspecified: Secondary | ICD-10-CM | POA: Diagnosis not present

## 2021-10-06 DIAGNOSIS — Z0000 Encounter for general adult medical examination without abnormal findings: Secondary | ICD-10-CM | POA: Diagnosis not present

## 2021-10-06 DIAGNOSIS — R5383 Other fatigue: Secondary | ICD-10-CM | POA: Diagnosis not present

## 2021-12-14 ENCOUNTER — Ambulatory Visit: Payer: Medicaid Other | Admitting: Nurse Practitioner

## 2021-12-14 ENCOUNTER — Telehealth: Payer: Self-pay | Admitting: Nurse Practitioner

## 2021-12-14 NOTE — Telephone Encounter (Signed)
Pt did not show for New Pt appointment on 12/14/21. She has [employer], I did not send a No Show letter.

## 2022-01-16 ENCOUNTER — Other Ambulatory Visit: Payer: Self-pay

## 2022-01-16 ENCOUNTER — Encounter (HOSPITAL_COMMUNITY): Payer: Self-pay

## 2022-01-16 ENCOUNTER — Emergency Department (HOSPITAL_COMMUNITY): Payer: Medicaid Other

## 2022-01-16 ENCOUNTER — Emergency Department (HOSPITAL_COMMUNITY)
Admission: EM | Admit: 2022-01-16 | Discharge: 2022-01-16 | Disposition: A | Discharge status: Home / Self Care | Payer: Medicaid Other | Attending: Emergency Medicine | Admitting: Emergency Medicine

## 2022-01-16 DIAGNOSIS — S91202A Unspecified open wound of left great toe with damage to nail, initial encounter: Secondary | ICD-10-CM | POA: Diagnosis not present

## 2022-01-16 DIAGNOSIS — W228XXA Striking against or struck by other objects, initial encounter: Secondary | ICD-10-CM | POA: Insufficient documentation

## 2022-01-16 DIAGNOSIS — S99922A Unspecified injury of left foot, initial encounter: Secondary | ICD-10-CM | POA: Diagnosis not present

## 2022-01-16 MED ORDER — LIDOCAINE HCL (PF) 1 % IJ SOLN
10.0000 mL | Freq: Once | INTRAMUSCULAR | Status: AC
  Administered 2022-01-16: 10 mL
  Filled 2022-01-16: qty 10

## 2022-01-16 MED ORDER — CEPHALEXIN 500 MG PO CAPS: 500.0000 mg | ORAL_CAPSULE | Freq: Four times a day (QID) | ORAL | 0 refills | Status: AC

## 2022-01-16 MED ORDER — OXYCODONE-ACETAMINOPHEN 5-325 MG PO TABS
1.0000 | ORAL_TABLET | Freq: Once | ORAL | Status: AC
  Administered 2022-01-16: 1 via ORAL
  Filled 2022-01-16: qty 1

## 2022-01-16 NOTE — ED Notes (Signed)
Requesting pain medications or DC ?

## 2022-01-16 NOTE — ED Notes (Signed)
Toe wrapped in gauze. Night PA does not wish to add pain medication to DC. Pt verbalized understanding of d/c instructions, meds, and followup care. Denies questions. VSS, no distress noted. Steady gait to exit with all belongings.  ?

## 2022-01-16 NOTE — ED Provider Notes (Signed)
?[facility] ?Provider Note ? ? ?CSN: [id number] ?Arrival date & time: 01/16/22  1204 ? ?  ? ?History ? ?Chief Complaint  ?Patient presents with  ? Toe Injury  ? ? ?Hannah Ryan is a 31 y.o. female who presents to the emergency department today complaining of left great toe injury onset prior to arrival.  She notes that she was placing her kids in the car this morning for school when she stubbed her left great toe.  Her toenail is currently hanging.  She has tried peroxide and Aleve at home with no relief for his symptoms.  Her most recent tetanus was approximately 1 year ago per patient.  She denies swelling, color change, drainage. ? ? ?The history is provided by the patient. No language interpreter was used.  ? ?  ? ?Home Medications ?Prior to Admission medications   ?Medication Sig Start Date End Date Taking? Authorizing Provider  ?cephALEXin (KEFLEX) 500 MG capsule Take 1 capsule (500 mg total) by mouth 4 (four) times daily. 01/16/22  Yes Alexya Mcdaris A, PA-C  ?acetaminophen (TYLENOL) 500 MG tablet Take 1,000 mg by mouth every 6 (six) hours as needed for moderate pain or headache.    [provider]  ?amLODipine (NORVASC) 10 MG tablet Take 1 tablet (10 mg total) by mouth daily. 09/12/19 09/11/20  Tresea Mall, CNM  ?Blood Pressure Monitoring (BLOOD PRESSURE MONITOR AUTOMAT) DEVI Take BP at home daily.  Alert [place] if >140/90 more than once. 04/30/19   Christin Fudge, CNM  ?Buprenorphine HCl-Naloxone HCl (SUBOXONE) 4-1 MG FILM Place 1 Film under the tongue 3 (three) times daily for 14 days. 12/05/19 12/19/19  Donnamae Jude, MD  ?ferrous sulfate 325 (65 FE) MG tablet Take 1 tablet (325 mg total) by mouth daily with breakfast. 09/13/19   Carollee Leitz, MD  ?ibuprofen (ADVIL) 800 MG tablet Take 1 tablet (800 mg total) by mouth every 8 (eight) hours as needed. 09/12/19   Tresea Mall, CNM  ?sertraline (ZOLOFT) 100 MG tablet Take 1 tablet (100 mg total) by  mouth daily. 11/07/19   Truett Mainland, DO  ?   ? ?Allergies    ?Patient has no known allergies.   ? ?Review of Systems   ?Review of Systems  ?Constitutional:  Negative for chills and fever.  ?Musculoskeletal:  Positive for arthralgias. Negative for gait problem and joint swelling.  ?Skin:  Negative for color change, rash and wound.  ?All other systems reviewed and are negative. ? ?Physical Exam ?Updated Vital Signs ?BP (!) 119/58   Pulse 72   Temp 98 ?F (36.7 ?C)   Resp 16   Ht 5\' 9"  (1.753 m)   Wt (!) 176.9 kg   SpO2 100%   BMI 57.59 kg/m?  ?Physical Exam ?Vitals and nursing note reviewed.  ?Constitutional:   ?   General: She is not in acute distress. ?   Appearance: Normal appearance.  ?Eyes:  ?   General: No scleral icterus. ?   Extraocular Movements: Extraocular movements intact.  ?Cardiovascular:  ?   Rate and Rhythm: Normal rate.  ?Pulmonary:  ?   Effort: Pulmonary effort is normal. No respiratory distress.  ?Musculoskeletal:  ?   Cervical back: Neck supple.  ?   Comments: Mild tenderness to palpation noted to distal phalanx of left great toe.  No surrounding erythema.  No swelling appreciated.  Pedal pulses intact bilaterally.  Full active range of motion of ankle and foot.  Sensation  intact.  Capillary refill less than 2 seconds.  Avulsed toenail noted to left great toe. See pictures below.  ?Skin: ?   General: Skin is warm and dry.  ?   Findings: No bruising, erythema or rash.  ?Neurological:  ?   Mental Status: She is alert.  ?Psychiatric:     ?   Behavior: Behavior normal.  ? ? ? ? ? ? ? ? ? ?ED Results / Procedures / Treatments   ?Labs ?(all labs ordered are listed, but only abnormal results are displayed) ?Labs Reviewed - No data to display ? ?EKG ?None ? ?Radiology ?DG Foot Complete Left ? ?Result Date: 01/16/2022 ?CLINICAL DATA:  Great toe injury EXAM: LEFT FOOT - COMPLETE 3 VIEW COMPARISON:  None. FINDINGS: There is no evidence of fracture or dislocation. Minimal degenerative changes of the  midfoot. Soft tissues are unremarkable. Probable avulsion of the nail of the great toe. IMPRESSION: No acute osseous abnormality. Electronically Signed   By: Yetta Glassman M.D.   On: 01/16/2022 13:20   ? ?Procedures ?Marland KitchenNail Removal ? ?Date/Time: 01/16/2022 6:47 PM ?Performed by: Oren Binet, Lelia Jons A, PA-C ?Authorized by: Steva Colder A, PA-C  ? ?Consent:  ?  Consent obtained:  Verbal ?  Consent given by:  Patient ?  Risks discussed:  Permanent nail deformity, bleeding, infection and pain ?  Alternatives discussed:  No treatment ?Universal protocol:  ?  Imaging studies available: yes   ?  Patient identity confirmed:  Verbally with patient ?Location:  ?  Foot:  L big toe ?Pre-procedure details:  ?  Skin preparation:  Povidone-iodine ?  Preparation: Patient was prepped and draped in the usual sterile fashion   ?Anesthesia:  ?  Anesthesia method:  Local infiltration ?  Local anesthetic:  Lidocaine 1% w/o epi (6 ML) ?Nail Removal:  ?  Nail removed:  Complete ?  Nail bed repaired: no   ?  Removed nail replaced and anchored: no   ?Post-procedure details:  ?  Dressing:  Xeroform gauze ?  Procedure completion:  Tolerated well, no immediate complications  ? ? ?Medications Ordered in ED ?Medications  ?lidocaine (PF) (XYLOCAINE) 1 % injection 10 mL (10 mLs Infiltration Given 01/16/22 1346)  ?oxyCODONE-acetaminophen (PERCOCET/ROXICET) 5-325 MG per tablet 1 tablet (1 tablet Oral Given 01/16/22 1410)  ? ? ?ED Course/ Medical Decision Making/ A&P ?  ?                        ?Medical Decision Making ?Amount and/or Complexity of Data Reviewed ?Radiology: ordered. ? ?Risk ?Prescription drug management. ? ? ?Pt presents with left great toe pain onset prior to arrival after stubbing her toe.  Vital signs stable, patient afebrile.  On exam patient with mild tenderness to palpation noted to distal phalanx of left great toe.  No surrounding erythema.  No swelling appreciated.  Pedal pulses intact bilaterally.  Full active range of motion of  ankle and foot.  Sensation intact.  Capillary refill less than 2 seconds.  Avulsed toenail noted to left great toe. Most recent tetanus within chart reviewed noted to be 06/27/2019.  Differential diagnosis includes fracture, dislocation, toenail avulsion, nailbed laceration.  ? ? ?Imaging: ?I ordered imaging studies including left foot x-ray ?I independently visualized and interpreted imaging which showed: No acute fracture or dislocation ?I agree with the radiologist interpretation ? ?Medications:  ?I ordered medication including Percocet for pain management ?Reevaluation of the patient after these medicines and interventions, I reevaluated the  patient and found that they have improved ?I have reviewed the patients home medicines and have made adjustments as needed ? ? ?Disposition: ?Patient presentation suspicious for toenail avulsion status post stubbing her left great toe.  Doubt nailbed laceration at this time.  Doubt fracture or dislocation at this time.  After consideration of the diagnostic results and the patients response to treatment, I feel that the patient would benefit from Discharge home.  We will send patient home with a prescription of Keflex.  Supportive care measures and strict return precautions discussed with patient at bedside. Pt acknowledges and verbalizes understanding. Pt appears safe for discharge. Follow up as indicated in discharge paperwork.  ? ? ?This chart was dictated using voice recognition software, Dragon. Despite the best efforts of this provider to proofread and correct errors, errors may still occur which can change documentation meaning. ? ? ?Final Clinical Impression(s) / ED Diagnoses ?Final diagnoses:  ?Injury of toe on left foot, initial encounter  ? ? ?Rx / DC Orders ?ED Discharge Orders   ? ?      Ordered  ?  cephALEXin (KEFLEX) 500 MG capsule  4 times daily       ? 01/16/22 1557  ? ?  ?  ? ?  ? ? ?  ?Dariya Gainer A, PA-C ?01/16/22 1849 ? ?  ?Davonna Belling,  MD ?01/17/22 [phone] ? ?

## 2022-01-16 NOTE — Discharge Instructions (Addendum)
It was a pleasure taking care of you today!  ? ?Your xray was negative for fracture or dislocation today. You may also take over the coutner 600 mg Ibuprofen as needed for pain for no more than 7 days. You will be sent keflex (antibiotic) take as prescribed. You may follow up with your primary care provider as needed. Return to the ED if you are experiencing increasing/worsening toe pain, drainage, fever, redness, or worsening symptoms.  ?

## 2022-01-16 NOTE — ED Triage Notes (Addendum)
Pt arrived POV from home c/o a left big toe injury. Pt stated she was putting her kids in the car this morning for school when she stumped her left big toe. Pt's toe nail is hanging. Pt stated she put peroxide on the toe to help clean it and stop the bleeding. Bleeding controlled at this time. Pt states her last tetanus shot was less than a year ago.  ?

## 2022-01-17 ENCOUNTER — Telehealth: Payer: Self-pay

## 2022-01-17 DIAGNOSIS — Z9189 Other specified personal risk factors, not elsewhere classified: Secondary | ICD-10-CM

## 2022-01-17 NOTE — Telephone Encounter (Signed)
Transition Care Management Follow-up Telephone Call ?Date of discharge and from where: 01/16/2022-[facility]  ?How have you been since you were released from the hospital? Pt state she is doing fine.  ?Any questions or concerns? No ? ?Items Reviewed: ?Did the pt receive and understand the discharge instructions provided? Yes  ?Medications obtained and verified? Yes  ?Other? No  ?Any new allergies since your discharge? No  ?Dietary orders reviewed? No ?Do you have support at home? Yes  ? ?Home Care and Equipment/Supplies: ?Were home health services ordered? not applicable ?If so, what is the name of the agency? N/A  ?Has the agency set up a time to come to the patient's home? not applicable ?Were any new equipment or medical supplies ordered?  No ?What is the name of the medical supply agency? N/A ?Were you able to get the supplies/equipment? not applicable ?Do you have any questions related to the use of the equipment or supplies? No ? ?Functional Questionnaire: (I = Independent and D = Dependent) ?ADLs: I ? ?Bathing/Dressing- I ? ?Meal Prep- I ? ?Eating- I ? ?Maintaining continence- I ? ?Transferring/Ambulation- I ? ?Managing Meds- I ? ?Follow up appointments reviewed: ? ?PCP Hospital f/u appt confirmed? No   ?Specialist Hospital f/u appt confirmed? No   ?Are transportation arrangements needed? No  ?If their condition worsens, is the pt aware to call PCP or go to the Emergency Dept.? Yes ?Was the patient provided with contact information for the PCP's office or ED? Yes ?Was to pt encouraged to call back with questions or concerns? Yes  ?

## 2022-01-20 ENCOUNTER — Other Ambulatory Visit: Payer: Self-pay

## 2022-01-20 NOTE — Patient Instructions (Signed)
Visit Information ? ?Ms. Hannah Ryan  - as a part of your Medicaid benefit, you are eligible for care management and care coordination services at no cost or copay. I was unable to reach you by phone today but would be happy to help you with your health related needs. Please feel free to call me @ [phone] ? ?A member of the Managed Medicaid care management team will reach out to you again over the next 7 days.  ? ?Gus Puma, BSW, MHA ?[facility]  ?High Risk Managed Medicaid Team  ?[phone]  ?

## 2022-01-20 NOTE — Patient Outreach (Signed)
Care Coordination ? ?01/20/2022 ? ?[name] ?07-Mar-1991 ?[id number] ? ? ?Medicaid Managed Care  ? ?Unsuccessful Outreach Note ? ?01/20/2022 ?Name: Hannah Ryan MRN: [id number] DOB: 01-Mar-1991 ? ?Referred by: Patient, No Pcp Per (Inactive) ?Reason for referral : High Risk Managed Medicaid (MM Social work Unsuccessful telephone outreach) ? ? ?An unsuccessful telephone outreach was attempted today. The patient was referred to the case management team for assistance with care management and care coordination.  ? ?Follow Up Plan: The care management team will reach out to the patient again over the next 7 days.  ? ?Gus Puma, BSW, MHA ?[facility]  ?High Risk Managed Medicaid Team  ?[phone]   ?

## 2022-01-23 ENCOUNTER — Telehealth: Payer: Self-pay | Admitting: *Deleted

## 2022-01-23 ENCOUNTER — Ambulatory Visit: Payer: Self-pay

## 2022-01-23 NOTE — Patient Instructions (Signed)
Hannah Ryan ,  ? ?The [facility] Managed Care Team is available to provide assistance to you with your healthcare needs at no cost and as a benefit of your [facility] Health plan. I'm sorry I was unable to reach you today for our scheduled appointment. Our care guide will call you to reschedule our telephone appointment. Please call me at the number below. I am available to be of assistance to you regarding your healthcare needs. .  ? ?Thank you,  ? ?Cranford Mon RN, CCM, CDCES ?[place]  [facility] ?Care Management Coordinator - Managed Medicaid High Risk ?[phone]  ?

## 2022-01-23 NOTE — Patient Outreach (Signed)
Care Coordination ? ?01/23/2022 ? ?[name] ?10/12/1991 ?[id number] ? ?01/23/2022 ?Name: Hannah Ryan MRN: [id number] DOB: 04-May-1991 ? ?Referred by: Patient, No Pcp Per (Inactive) ?Reason for referral : High Risk Managed Medicaid (Unsuccessful Initial RNCM Telephone Outreach) ? ? ?An unsuccessful telephone outreach was attempted today. The patient was referred to the case management team for assistance with care management and care coordination.   ? ?Follow Up Plan: A HIPAA compliant phone message was left for the patient providing contact information and requesting a return call. and The Managed Medicaid care management team will reach out to the patient again over the next 7 days.  ? ?Cranford Mon RN, CCM, CDCES ?[place]  [facility] ?Care Management Coordinator - Managed Medicaid High Risk ?[phone]  ?

## 2022-01-31 ENCOUNTER — Other Ambulatory Visit: Payer: Self-pay

## 2022-01-31 NOTE — Patient Instructions (Signed)
Visit Information ? ?Ms. Hannah Ryan  - as a part of your Medicaid benefit, you are eligible for care management and care coordination services at no cost or copay. I was unable to reach you by phone today but would be happy to help you with your health related needs. Please feel free to call me [phone] ? ? ?Mickel Fuchs, BSW, [place] ?[place]  ?High Risk Managed Medicaid Team  ?[phone]  ?

## 2022-01-31 NOTE — Patient Outreach (Signed)
Care Coordination ? ?01/31/2022 ? ?[name] ?[birth year] ?[id number] ? ? ?Medicaid Managed Care  ? ?Unsuccessful Outreach Note ? ?01/31/2022 ?Name: Hannah Ryan MRN: [id number] DOB: May 04, 1991 ? ?Referred by: Patient, No Pcp Per (Inactive) ?Reason for referral : High Risk Managed Medicaid (MM Social Work Doctor, general practice) ? ? ?A second unsuccessful telephone outreach was attempted today. The patient was referred to the case management team for assistance with care management and care coordination.  ? ?Follow Up Plan: The patient has been provided with contact information for the care management team and has been advised to call with any health related questions or concerns.  ? ?Gus Puma, BSW, MHA ?[facility]  ?High Risk Managed Medicaid Team  ?[phone]  ?

## 2022-06-15 ENCOUNTER — Emergency Department (HOSPITAL_COMMUNITY)
Admission: EM | Admit: 2022-06-15 | Discharge: 2022-06-15 | Disposition: A | Discharge status: Home / Self Care | Payer: Medicaid Other | Attending: Emergency Medicine | Admitting: Emergency Medicine

## 2022-06-15 ENCOUNTER — Encounter (HOSPITAL_COMMUNITY): Payer: Self-pay | Admitting: Emergency Medicine

## 2022-06-15 ENCOUNTER — Other Ambulatory Visit: Payer: Self-pay

## 2022-06-15 DIAGNOSIS — Z5321 Procedure and treatment not carried out due to patient leaving prior to being seen by health care provider: Secondary | ICD-10-CM | POA: Insufficient documentation

## 2022-06-15 DIAGNOSIS — N6489 Other specified disorders of breast: Secondary | ICD-10-CM | POA: Diagnosis not present

## 2022-06-15 DIAGNOSIS — N9489 Other specified conditions associated with female genital organs and menstrual cycle: Secondary | ICD-10-CM | POA: Insufficient documentation

## 2022-06-15 DIAGNOSIS — R6883 Chills (without fever): Secondary | ICD-10-CM | POA: Diagnosis not present

## 2022-06-15 DIAGNOSIS — N644 Mastodynia: Secondary | ICD-10-CM | POA: Diagnosis not present

## 2022-06-15 LAB — CBC WITH DIFFERENTIAL/PLATELET
Abs Immature Granulocytes: 0.01 10*3/uL (ref 0.00–0.07)
Basophils Absolute: 0 10*3/uL (ref 0.0–0.1)
Basophils Relative: 0 %
Eosinophils Absolute: 0.2 10*3/uL (ref 0.0–0.5)
Eosinophils Relative: 3 %
HCT: 39.3 % (ref 36.0–46.0)
Hemoglobin: 12 g/dL (ref 12.0–15.0)
Immature Granulocytes: 0 %
Lymphocytes Relative: 30 %
Lymphs Abs: 1.9 10*3/uL (ref 0.7–4.0)
MCH: 26.6 pg (ref 26.0–34.0)
MCHC: 30.5 g/dL (ref 30.0–36.0)
MCV: 87.1 fL (ref 80.0–100.0)
Monocytes Absolute: 0.6 10*3/uL (ref 0.1–1.0)
Monocytes Relative: 9 %
Neutro Abs: 3.6 10*3/uL (ref 1.7–7.7)
Neutrophils Relative %: 58 %
Platelets: 218 10*3/uL (ref 150–400)
RBC: 4.51 MIL/uL (ref 3.87–5.11)
RDW: 13.9 % (ref 11.5–15.5)
WBC: 6.2 10*3/uL (ref 4.0–10.5)
nRBC: 0 % (ref 0.0–0.2)

## 2022-06-15 LAB — COMPREHENSIVE METABOLIC PANEL
ALT: 11 U/L (ref 0–44)
AST: 15 U/L (ref 15–41)
Albumin: 3.3 g/dL — ABNORMAL LOW (ref 3.5–5.0)
Alkaline Phosphatase: 67 U/L (ref 38–126)
Anion gap: 6 (ref 5–15)
BUN: 8 mg/dL (ref 6–20)
CO2: 27 mmol/L (ref 22–32)
Calcium: 9 mg/dL (ref 8.9–10.3)
Chloride: 106 mmol/L (ref 98–111)
Creatinine, Ser: 0.63 mg/dL (ref 0.44–1.00)
GFR, Estimated: >60 mL/min (ref >60)
Glucose, Bld: 105 mg/dL — ABNORMAL HIGH (ref 70–99)
Potassium: 3.9 mmol/L (ref 3.5–5.1)
Sodium: 139 mmol/L (ref 135–145)
Total Bilirubin: 0.3 mg/dL (ref 0.3–1.2)
Total Protein: 6.7 g/dL (ref 6.5–8.1)

## 2022-06-15 LAB — I-STAT BETA HCG BLOOD, ED (MC, WL, AP ONLY): I-stat hCG, quantitative: <5 m[IU]/mL (ref <5)

## 2022-06-15 LAB — URINALYSIS, ROUTINE W REFLEX MICROSCOPIC
Bilirubin Urine: NEGATIVE
Glucose, UA: NEGATIVE mg/dL
Hgb urine dipstick: NEGATIVE
Ketones, ur: NEGATIVE mg/dL
Nitrite: NEGATIVE
Protein, ur: NEGATIVE mg/dL
Specific Gravity, Urine: 1.019 (ref 1.005–1.030)
pH: 6 (ref 5.0–8.0)

## 2022-06-15 LAB — LACTIC ACID, PLASMA: Lactic Acid, Venous: 0.8 mmol/L (ref 0.5–1.9)

## 2022-06-15 NOTE — ED Provider Triage Note (Signed)
Emergency Medicine Provider Triage Evaluation Note  Ch[place] Spees , a 31 y.o. female  was evaluated in triage.  Pt complains of breast pain. Report pain to L breast x 2 days, with worsening redness and warmth.  No fever, chills, sob.  No trauma.    Review of Systems  Positive: As above Negative: As above  Physical Exam  BP (!) 149/97 (BP Location: Right Arm)   Pulse 75   Temp 98.2 F (36.8 C) (Oral)   Resp (!) 24   SpO2 99%  Gen:   Awake, no distress   Resp:  Normal effort  MSK:   Moves extremities without difficulty  Other:    Medical Decision Making  Medically screening exam initiated at 6:17 PM.  Appropriate orders placed.  Kaizley Aja was informed that the remainder of the evaluation will be completed by another provider, this initial triage assessment does not replace that evaluation, and the importance of remaining in the ED until their evaluation is complete.  L breast cellulitis with possible abscess.  May need IV abx   Fayrene Helper, PA-C 06/15/22 1818

## 2022-06-15 NOTE — ED Triage Notes (Signed)
Pt with swelling/red/warm area to left breast that has gotten significantly worse in the past two days. Chills, no reported fevers.  Hx of "boils" in the past but nothing like this.

## 2022-06-15 NOTE — ED Notes (Signed)
Pt said she has small children at home. The wait is to long. Pt left AMA

## 2022-06-16 ENCOUNTER — Ambulatory Visit (HOSPITAL_COMMUNITY)
Admission: EM | Admit: 2022-06-16 | Discharge: 2022-06-16 | Disposition: A | Discharge status: Home / Self Care | Payer: Medicaid Other

## 2022-06-16 ENCOUNTER — Encounter (HOSPITAL_COMMUNITY): Payer: Self-pay

## 2022-06-16 DIAGNOSIS — N611 Abscess of the breast and nipple: Secondary | ICD-10-CM | POA: Diagnosis not present

## 2022-06-16 MED ORDER — CEFTRIAXONE SODIUM 1 G IJ SOLR
INTRAMUSCULAR | Status: AC
  Filled 2022-06-16: qty 10

## 2022-06-16 MED ORDER — KETOROLAC TROMETHAMINE 60 MG/2ML IM SOLN
INTRAMUSCULAR | Status: AC
  Filled 2022-06-16: qty 2

## 2022-06-16 MED ORDER — CEFTRIAXONE SODIUM 1 G IJ SOLR
1.0000 g | Freq: Once | INTRAMUSCULAR | Status: AC
  Administered 2022-06-16: 1 g via INTRAMUSCULAR

## 2022-06-16 MED ORDER — LIDOCAINE HCL (PF) 1 % IJ SOLN
INTRAMUSCULAR | Status: AC
  Filled 2022-06-16: qty 2

## 2022-06-16 MED ORDER — KETOROLAC TROMETHAMINE 60 MG/2ML IM SOLN
60.0000 mg | Freq: Once | INTRAMUSCULAR | Status: AC
  Administered 2022-06-16: 60 mg via INTRAMUSCULAR

## 2022-06-16 MED ORDER — DOXYCYCLINE HYCLATE 100 MG PO CAPS: 100.0000 mg | ORAL_CAPSULE | Freq: Two times a day (BID) | ORAL | 0 refills | Status: DC

## 2022-06-16 NOTE — ED Triage Notes (Signed)
Pt is here for abscess on the left breast , causing chills, pain and fever x4days

## 2022-06-16 NOTE — Discharge Instructions (Addendum)
Order for Ultrasound and Mammogram to be scheduled [facility]  [phone]  Referral to General Surgeon for evaluation of left breast You have been given Rocephin for the infection You were also given Toradol for the pain Started on Doxycyline 100 mg BID for 10 days Encourage completion of your treatment even when symptoms improve.

## 2022-06-16 NOTE — ED Provider Notes (Signed)
[facility]    CSN: [id number] Arrival date & time: 06/16/22  1631      History   Chief Complaint Chief Complaint  Patient presents with  . Abscess    HPI Hannah Ryan is a 31 y.o. female.   HPI  She is complaining of right breast pain and swelling. She   Past Medical History:  Diagnosis Date  . Anxiety    per patient/family  . Chlamydia   . Depression    per patient/family  . Gonorrhea   . Obesity   . Trichomonas infection     Patient Active Problem List   Diagnosis Date Noted  . Encounter for monitoring Suboxone maintenance therapy 10/21/2019  . Opiate dependence, continuous (HCC) 10/21/2019  . Wound infection after surgery 09/29/2019  . Smoker 03/11/2019  . Marijuana use 03/11/2019  . Polysubstance abuse (HCC) 11/20/2017  . Essential hypertension 01/05/2012  . Morbid obesity (HCC) 01/05/2012    Past Surgical History:  Procedure Laterality Date  . ADENOIDECTOMY    . CESAREAN SECTION    . CESAREAN SECTION N/A 09/09/2019   Procedure: CESAREAN SECTION;  Surgeon: Levie Heritage, DO;  Location: [facility];  Service: Obstetrics;  Laterality: N/A;  . FOOT SURGERY     left  . IRRIGATION AND DEBRIDEMENT ABSCESS  01/08/2012   Procedure: IRRIGATION AND DEBRIDEMENT ABSCESS;  Surgeon: Fabio Bering, MD;  Location: [facility];  Service: General;  Laterality: Right;  . TONSILLECTOMY      OB History     Gravida  2   Para  2   Term  2   Preterm      AB      Living  2      SAB      IAB      Ectopic      Multiple  0   Live Births  2            Home Medications    Prior to Admission medications   Medication Sig Start Date End Date Taking? Authorizing Provider  NIFEdipine (PROCARDIA-XL/NIFEDICAL-XL) 30 MG 24 hr tablet Take 1 tablet by mouth daily. 04/15/18  Yes [provider]  acetaminophen (TYLENOL) 500 MG tablet Take 1,000 mg by mouth every 6 (six) hours as needed for moderate pain or headache.    [provider]  amLODipine (NORVASC) 10 MG tablet Take 1 tablet (10 mg total) by mouth daily. 09/12/19 09/11/20  Thressa Sheller D, CNM  Blood Pressure Monitoring (BLOOD PRESSURE MONITOR AUTOMAT) DEVI Take BP at home daily.  Alert [place] if >140/90 more than once. 04/30/19   Cresenzo-Dishmon, Scarlette Calico, CNM  Buprenorphine HCl-Naloxone HCl (SUBOXONE) 4-1 MG FILM Place 1 Film under the tongue 3 (three) times daily for 14 days. 12/05/19 12/19/19  Reva Bores, MD  busPIRone (BUSPAR) 10 MG tablet Take 10 mg by mouth 2 (two) times daily. 05/01/22   [provider]  cephALEXin (KEFLEX) 500 MG capsule Take 1 capsule (500 mg total) by mouth 4 (four) times daily. 01/16/22   Blue, Soijett A, PA-C  Cholecalciferol (VITAMIN D3) 50 MCG (2000 UT) TABS Take 1 tablet by mouth daily. 03/28/22   [provider]  ferrous sulfate 325 (65 FE) MG tablet Take 1 tablet (325 mg total) by mouth daily with breakfast. 09/13/19   Dana Allan, MD  gabapentin (NEURONTIN) 300 MG capsule Take 300 mg by mouth daily. 06/04/22   [provider]  ibuprofen (ADVIL) 800 MG tablet  Take 1 tablet (800 mg total) by mouth every 8 (eight) hours as needed. 09/12/19   Armando Reichert, CNM  sertraline (ZOLOFT) 100 MG tablet Take 1 tablet (100 mg total) by mouth daily. 11/07/19   Levie Heritage, DO  SUBOXONE 8-2 MG FILM Place under the tongue 3 (three) times daily. 05/22/22   [provider]    Family History Family History  Problem Relation Age of Onset  . Diabetes Maternal Grandmother   . Hypertension Maternal Grandmother   . Hypertension Mother   . Diabetes Mother   . Autism Brother   . Lupus Paternal Aunt   . Lupus Cousin     Social History Social History   Tobacco Use  . Smoking status: Some Days    Packs/day: 0.25    Years: 3.00    Total pack years: 0.75    Types: Cigarettes  . Smokeless tobacco: Never  Vaping Use  . Vaping Use: Never used  Substance Use Topics  . Alcohol use: No  . Drug use:  Yes    Types: Marijuana, Hydrocodone, Cocaine    Comment: pt did report smoking marijuana this past week     Allergies   Patient has no known allergies.   Review of Systems Review of Systems   Physical Exam Triage Vital Signs ED Triage Vitals  Enc Vitals Group     BP 06/16/22 1741 (!) 137/93     Pulse Rate 06/16/22 1741 79     Resp 06/16/22 1741 12     Temp 06/16/22 1741 98.1 F (36.7 C)     Temp Source 06/16/22 1741 Oral     SpO2 06/16/22 1741 97 %     Weight 06/16/22 1739 300 lb (136.1 kg)     Height 06/16/22 1739 5\' 10"  (1.778 m)     Head Circumference --      Peak Flow --      Pain Score --      Pain Loc --      Pain Edu? --      Excl. in GC? --    No data found.  Updated Vital Signs BP (!) 137/93 (BP Location: Left Arm)   Pulse 79   Temp 98.1 F (36.7 C) (Oral)   Resp 12   Ht 5\' 10"  (1.778 m)   Wt 300 lb (136.1 kg)   LMP  (LMP Unknown)   SpO2 97%   BMI 43.05 kg/m   Visual Acuity Right Eye Distance:   Left Eye Distance:   Bilateral Distance:    Right Eye Near:   Left Eye Near:    Bilateral Near:     Physical Exam   UC Treatments / Results  Labs (all labs ordered are listed, but only abnormal results are displayed) Labs Reviewed - No data to display  EKG   Radiology No results found.  Procedures Procedures (including critical care time)  Medications Ordered in UC Medications - No data to display  Initial Impression / Assessment and Plan / UC Course  I have reviewed the triage vital signs and the nursing notes.  Pertinent labs & imaging results that were available during my care of the patient were reviewed by me and considered in my medical decision making (see chart for details).     *** Final Clinical Impressions(s) / UC Diagnoses   Final diagnoses:  None   Discharge Instructions   None    ED Prescriptions   None    PDMP not  reviewed this encounter.

## 2022-06-20 ENCOUNTER — Encounter (HOSPITAL_COMMUNITY): Payer: Self-pay

## 2022-06-20 ENCOUNTER — Other Ambulatory Visit: Payer: Self-pay

## 2022-06-20 ENCOUNTER — Emergency Department (HOSPITAL_COMMUNITY)
Admission: EM | Admit: 2022-06-20 | Discharge: 2022-06-21 | Disposition: A | Discharge status: Home / Self Care | Payer: Medicaid Other | Attending: Student | Admitting: Student

## 2022-06-20 DIAGNOSIS — L0291 Cutaneous abscess, unspecified: Secondary | ICD-10-CM

## 2022-06-20 DIAGNOSIS — N611 Abscess of the breast and nipple: Secondary | ICD-10-CM | POA: Diagnosis not present

## 2022-06-20 LAB — CBC WITH DIFFERENTIAL/PLATELET
Abs Immature Granulocytes: 0.02 10*3/uL (ref 0.00–0.07)
Basophils Absolute: 0 10*3/uL (ref 0.0–0.1)
Basophils Relative: 0 %
Eosinophils Absolute: 0.2 10*3/uL (ref 0.0–0.5)
Eosinophils Relative: 3 %
HCT: 37.8 % (ref 36.0–46.0)
Hemoglobin: 11.5 g/dL — ABNORMAL LOW (ref 12.0–15.0)
Immature Granulocytes: 0 %
Lymphocytes Relative: 33 %
Lymphs Abs: 2.5 10*3/uL (ref 0.7–4.0)
MCH: 26.1 pg (ref 26.0–34.0)
MCHC: 30.4 g/dL (ref 30.0–36.0)
MCV: 85.9 fL (ref 80.0–100.0)
Monocytes Absolute: 0.6 10*3/uL (ref 0.1–1.0)
Monocytes Relative: 7 %
Neutro Abs: 4.4 10*3/uL (ref 1.7–7.7)
Neutrophils Relative %: 57 %
Platelets: 246 10*3/uL (ref 150–400)
RBC: 4.4 MIL/uL (ref 3.87–5.11)
RDW: 13.9 % (ref 11.5–15.5)
WBC: 7.7 10*3/uL (ref 4.0–10.5)
nRBC: 0 % (ref 0.0–0.2)

## 2022-06-20 LAB — BASIC METABOLIC PANEL
Anion gap: 6 (ref 5–15)
BUN: 13 mg/dL (ref 6–20)
CO2: 27 mmol/L (ref 22–32)
Calcium: 8.8 mg/dL — ABNORMAL LOW (ref 8.9–10.3)
Chloride: 110 mmol/L (ref 98–111)
Creatinine, Ser: 0.61 mg/dL (ref 0.44–1.00)
GFR, Estimated: >60 mL/min (ref >60)
Glucose, Bld: 112 mg/dL — ABNORMAL HIGH (ref 70–99)
Potassium: 4 mmol/L (ref 3.5–5.1)
Sodium: 143 mmol/L (ref 135–145)

## 2022-06-20 NOTE — ED Provider Notes (Signed)
[facility]EMERGENCY [facility]PT Provider Note   CSN: [id number] Arrival date & time: 06/20/22  1746     History {Add pertinent medical, surgical, social history, OB history to HPI:1} Chief Complaint  Patient presents with   Abscess    Hannah Ryan is a 31 y.o. female.  HPI   Medical history including anxiety, obesity, presents with complaints of abscess on her left breast, she notes that this started about 2 weeks ago, she had a small spot above her left nipple, this got larger and more painful, states that she started antibiotics on the 25th while she is at an urgent care, she states since taking this the abscess is gotten larger and now she has noticed some peeling of the skin, denies any drainage or discharge, she has had subjective chills without objective fevers, some nausea without vomiting, no night sweats unexplainable weight gain or weight loss no bone pain, no family history of breast cancer.  She is not immunocompromise, has no other complaints.    Home Medications Prior to Admission medications   Medication Sig Start Date End Date Taking? Authorizing Provider  acetaminophen (TYLENOL) 500 MG tablet Take 1,000 mg by mouth every 6 (six) hours as needed for moderate pain or headache.   Yes [provider]  busPIRone (BUSPAR) 10 MG tablet Take 10 mg by mouth 2 (two) times daily. 05/01/22  Yes [provider]  doxycycline (VIBRAMYCIN) 100 MG capsule Take 1 capsule (100 mg total) by mouth 2 (two) times daily for 10 days. 06/16/22 06/26/22 Yes King, Shana Chute, NP  gabapentin (NEURONTIN) 300 MG capsule Take 300 mg by mouth daily. 06/04/22  Yes [provider]  sertraline (ZOLOFT) 100 MG tablet Take 1 tablet (100 mg total) by mouth daily. 11/07/19  Yes Levie Heritage, DO  SUBOXONE 8-2 MG FILM Place under the tongue 3 (three) times daily. 05/22/22  Yes [provider]  amLODipine (NORVASC) 10 MG tablet Take 1 tablet (10 mg total) by  mouth daily. Patient not taking: Reported on 06/20/2022 09/12/19 09/11/20  Armando Reichert, CNM  Blood Pressure Monitoring (BLOOD PRESSURE MONITOR AUTOMAT) DEVI Take BP at home daily.  Alert [place] if >140/90 more than once. 04/30/19   Cresenzo-Dishmon, Scarlette Calico, CNM  Buprenorphine HCl-Naloxone HCl (SUBOXONE) 4-1 MG FILM Place 1 Film under the tongue 3 (three) times daily for 14 days. Patient not taking: Reported on 06/20/2022 12/05/19 06/20/22  Reva Bores, MD  cephALEXin (KEFLEX) 500 MG capsule Take 1 capsule (500 mg total) by mouth 4 (four) times daily. Patient not taking: Reported on 06/20/2022 01/16/22   Blue, Soijett A, PA-C  ferrous sulfate 325 (65 FE) MG tablet Take 1 tablet (325 mg total) by mouth daily with breakfast. Patient not taking: Reported on 06/20/2022 09/13/19   Dana Allan, MD  ibuprofen (ADVIL) 800 MG tablet Take 1 tablet (800 mg total) by mouth every 8 (eight) hours as needed. Patient not taking: Reported on 06/20/2022 09/12/19   Armando Reichert, CNM      Allergies    Patient has no known allergies.    Review of Systems   Review of Systems  Constitutional:  Negative for chills and fever.  Respiratory:  Negative for shortness of breath.   Cardiovascular:  Negative for chest pain.  Gastrointestinal:  Negative for abdominal pain.  Skin:  Positive for wound.  Neurological:  Negative for headaches.    Physical Exam Updated Vital Signs BP (!) 149/91   Pulse 74  Temp 97.8 F (36.6 C) (Oral)   Resp 17   LMP  (LMP Unknown)   SpO2 100%  Physical Exam Vitals and nursing note reviewed. Exam conducted with a chaperone present.  Constitutional:      General: She is not in acute distress.    Appearance: She is not ill-appearing.  HENT:     Head: Normocephalic and atraumatic.     Nose: No congestion.  Eyes:     Conjunctiva/sclera: Conjunctivae normal.  Cardiovascular:     Rate and Rhythm: Normal rate and regular rhythm.     Pulses: Normal pulses.  Pulmonary:     Effort:  Pulmonary effort is normal.  Skin:    General: Skin is warm and dry.     Comments: With chaperone present breast exam was performed, patient has a large mass noted at the 11 o'clock position above the left nipple, is about the size of a baseball, overlying skin changes, there is surrounding erythema, area was indurated with fluctuance noted, tender to palpation.  There is no nipple inversion no other skin changes present.  Please see picture for full detail  Neurological:     Mental Status: She is alert.  Psychiatric:        Mood and Affect: Mood normal.        ED Results / Procedures / Treatments   Labs (all labs ordered are listed, but only abnormal results are displayed) Labs Reviewed  BASIC METABOLIC PANEL - Abnormal; Notable for the following components:      Result Value   Glucose, Bld 112 (*)    Calcium 8.8 (*)    All other components within normal limits  CBC WITH DIFFERENTIAL/PLATELET - Abnormal; Notable for the following components:   Hemoglobin 11.5 (*)    All other components within normal limits    EKG None  Radiology No results found.  Procedures Procedures  {Document cardiac monitor, telemetry assessment procedure when appropriate:1}  Medications Ordered in ED Medications - No data to display  ED Course/ Medical Decision Making/ A&P                           Medical Decision Making  This patient presents to the ED for concern of abscess, this involves an extensive number of treatment options, and is a complaint that carries with it a high risk of complications and morbidity.  The differential diagnosis includes cellulitis, deep tissue abscess, malignancy    Additional history obtained:  Additional history obtained from N/A External records from outside source obtained and reviewed including previous OB/GYN notes   Co morbidities that complicate the patient evaluation  Obesity  Social Determinants of Health:  No primary care  provider    Lab Tests:  I Ordered, and personally interpreted labs.  The pertinent results include: CBC shows no risk anemia he will 11.5, BMP shows glucose of 112, calcium 8.8   Imaging Studies ordered:  I ordered imaging studies including ***  I independently visualized and interpreted imaging which showed *** I agree with the radiologist interpretation   Cardiac Monitoring:  The patient was maintained on a cardiac monitor.  I personally viewed and interpreted the cardiac monitored which showed an underlying rhythm of: N/A   Medicines ordered and prescription drug management:  I ordered medication including ***  for ***  I have reviewed the patients home medicines and have made adjustments as needed  Critical Interventions:  N/A   Reevaluation:  Presents with concerns of an abscess, she was obtain basic lab work-up, on my evaluation she has a large mass noted above her left nipple, unclear if this is malignancy versus abscess, will consult with general surgery for further recommendations.   Consultations Obtained:  I requested consultation with the ***,  and discussed lab and imaging findings as well as pertinent plan - they recommend: ***    Test Considered:  ***    Rule out ****    Dispostion and problem list  After consideration of the diagnostic results and the patients response to treatment, I feel that the patent would benefit from ***.       {Document critical care time when appropriate:1} {Document review of labs and clinical decision tools ie heart score, Chads2Vasc2 etc:1}  {Document your independent review of radiology images, and any outside records:1} {Document your discussion with family members, caretakers, and with consultants:1} {Document social determinants of health affecting pt's care:1} {Document your decision making why or why not admission, treatments were needed:1} Final Clinical Impression(s) / ED Diagnoses Final  diagnoses:  None    Rx / DC Orders ED Discharge Orders     None

## 2022-06-20 NOTE — ED Triage Notes (Signed)
Pt reports abscess to left breast that he noticed 2 weeks ago. Pt reports going to UC and was told to come here for surgery.

## 2022-06-20 NOTE — ED Provider Triage Note (Signed)
Emergency Medicine Provider Triage Evaluation Note  Hannah Ryan , a 31 y.o. female  was evaluated in triage.  Pt complains of left breast cellulitis and abscess.  Patient was seen in [place] on 8/24 but left after triage due to wait times.  She went to urgent care on 2/25 and was started on doxycycline and referred to [facility] surgery.  [facility] referred her to the emergency department for possible procedure at the emergency department.  Patient states she has a lot of pain, denies fevers, and endorses nausea.  Review of Systems  Positive: As above Negative: As above  Physical Exam  BP (!) 150/88 (BP Location: Left Arm)   Pulse 68   Temp 97.9 F (36.6 C) (Oral)   Resp 16   LMP  (LMP Unknown)   SpO2 100%  Gen:   Awake, no distress   Resp:  Normal effort  MSK:   Moves extremities without difficulty  Other:    Medical Decision Making  Medically screening exam initiated at 6:44 PM.  Appropriate orders placed.  Amarissa Koerner was informed that the remainder of the evaluation will be completed by another provider, this initial triage assessment does not replace that evaluation, and the importance of remaining in the ED until their evaluation is complete.     Darrick Grinder, PA-C 06/20/22 1846

## 2022-06-21 MED ORDER — LIDOCAINE HCL 1 % IJ SOLN
INTRAMUSCULAR | Status: AC
  Administered 2022-06-21: 2.1 mL
  Filled 2022-06-21: qty 20

## 2022-06-21 MED ORDER — AMOXICILLIN-POT CLAVULANATE 875-125 MG PO TABS: 1.0000 | ORAL_TABLET | Freq: Two times a day (BID) | ORAL | 0 refills | Status: AC

## 2022-06-21 MED ORDER — KETOROLAC TROMETHAMINE 15 MG/ML IJ SOLN
15.0000 mg | Freq: Once | INTRAMUSCULAR | Status: AC
  Administered 2022-06-21: 15 mg via INTRAMUSCULAR
  Filled 2022-06-21: qty 1

## 2022-06-21 MED ORDER — CEFTRIAXONE SODIUM 1 G IJ SOLR
1.0000 g | Freq: Once | INTRAMUSCULAR | Status: AC
  Administered 2022-06-21: 1 g via INTRAMUSCULAR
  Filled 2022-06-21: qty 10

## 2022-06-21 NOTE — Discharge Instructions (Signed)
I would like you to stop taking your doxycycline and start taking the Augmentin, please continue with over-the-counter pain medication as needed for pain, you may apply heat to the area to help bring out the infection.   If you do not hear back from the breast center I gave him their contact information above please call by Thursday or Friday.  If you start to develop worsening pain fevers chills or overall feeling worse please come back in for reassessment.

## 2022-06-23 ENCOUNTER — Other Ambulatory Visit: Payer: Self-pay | Admitting: General Surgery

## 2022-06-23 DIAGNOSIS — N611 Abscess of the breast and nipple: Secondary | ICD-10-CM

## 2022-06-27 ENCOUNTER — Other Ambulatory Visit: Payer: Medicaid Other

## 2022-06-27 ENCOUNTER — Inpatient Hospital Stay: Admission: RE | Admit: 2022-06-27 | Payer: Medicaid Other | Source: Ambulatory Visit

## 2022-07-13 ENCOUNTER — Encounter: Payer: Medicaid Other | Admitting: Obstetrics and Gynecology

## 2022-07-13 ENCOUNTER — Encounter: Payer: Medicaid Other | Admitting: Family Medicine

## 2022-11-07 ENCOUNTER — Ambulatory Visit (INDEPENDENT_AMBULATORY_CARE_PROVIDER_SITE_OTHER): Payer: Medicaid Other | Admitting: Primary Care

## 2023-08-01 DIAGNOSIS — F4310 Post-traumatic stress disorder, unspecified: Secondary | ICD-10-CM | POA: Diagnosis not present

## 2023-08-01 DIAGNOSIS — F411 Generalized anxiety disorder: Secondary | ICD-10-CM | POA: Diagnosis not present
